# Patient Record
Sex: Male | Born: 1950 | Race: White | Hispanic: No | Marital: Married | Smoking: Former smoker
Health system: Southern US, Community
[De-identification: ages and names within clinical notes are randomized; demographics above are authoritative.]

## PROBLEM LIST (undated history)

## (undated) DIAGNOSIS — R600 Localized edema: Secondary | ICD-10-CM

## (undated) DIAGNOSIS — M1990 Unspecified osteoarthritis, unspecified site: Secondary | ICD-10-CM

## (undated) DIAGNOSIS — R609 Edema, unspecified: Secondary | ICD-10-CM

## (undated) DIAGNOSIS — K219 Gastro-esophageal reflux disease without esophagitis: Secondary | ICD-10-CM

## (undated) DIAGNOSIS — N400 Enlarged prostate without lower urinary tract symptoms: Secondary | ICD-10-CM

## (undated) DIAGNOSIS — G4733 Obstructive sleep apnea (adult) (pediatric): Secondary | ICD-10-CM

## (undated) DIAGNOSIS — M109 Gout, unspecified: Secondary | ICD-10-CM

## (undated) DIAGNOSIS — R351 Nocturia: Secondary | ICD-10-CM

## (undated) DIAGNOSIS — I10 Essential (primary) hypertension: Secondary | ICD-10-CM

## (undated) DIAGNOSIS — I2510 Atherosclerotic heart disease of native coronary artery without angina pectoris: Secondary | ICD-10-CM

## (undated) DIAGNOSIS — F39 Unspecified mood [affective] disorder: Secondary | ICD-10-CM

## (undated) DIAGNOSIS — G7000 Myasthenia gravis without (acute) exacerbation: Secondary | ICD-10-CM

## (undated) DIAGNOSIS — I4891 Unspecified atrial fibrillation: Secondary | ICD-10-CM

## (undated) DIAGNOSIS — M2550 Pain in unspecified joint: Secondary | ICD-10-CM

## (undated) DIAGNOSIS — M2540 Effusion, unspecified joint: Secondary | ICD-10-CM

## (undated) DIAGNOSIS — I219 Acute myocardial infarction, unspecified: Secondary | ICD-10-CM

## (undated) DIAGNOSIS — J189 Pneumonia, unspecified organism: Secondary | ICD-10-CM

## (undated) DIAGNOSIS — Z87442 Personal history of urinary calculi: Secondary | ICD-10-CM

## (undated) DIAGNOSIS — R350 Frequency of micturition: Secondary | ICD-10-CM

## (undated) DIAGNOSIS — E785 Hyperlipidemia, unspecified: Secondary | ICD-10-CM

## (undated) HISTORY — PX: ANKLE ARTHROSCOPY: SUR85

## (undated) HISTORY — DX: Hyperlipidemia, unspecified: E78.5

## (undated) HISTORY — DX: Acute myocardial infarction, unspecified: I21.9

## (undated) HISTORY — DX: Atherosclerotic heart disease of native coronary artery without angina pectoris: I25.10

## (undated) HISTORY — DX: Essential (primary) hypertension: I10

## (undated) HISTORY — DX: Obstructive sleep apnea (adult) (pediatric): G47.33

## (undated) HISTORY — PX: CORONARY ANGIOPLASTY: SHX604

## (undated) HISTORY — PX: OTHER SURGICAL HISTORY: SHX169

## (undated) HISTORY — PX: BACK SURGERY: SHX140

## (undated) HISTORY — PX: TOTAL KNEE ARTHROPLASTY: SHX125

## (undated) HISTORY — DX: Unspecified osteoarthritis, unspecified site: M19.90

---

## 2003-11-12 HISTORY — PX: CARDIAC CATHETERIZATION: SHX172

## 2004-06-03 ENCOUNTER — Encounter: Payer: Self-pay | Admitting: Cardiology

## 2004-06-11 ENCOUNTER — Emergency Department (HOSPITAL_COMMUNITY): Admission: EM | Admit: 2004-06-11 | Discharge: 2004-06-11 | Payer: Self-pay | Admitting: Emergency Medicine

## 2004-06-21 ENCOUNTER — Encounter (HOSPITAL_COMMUNITY): Admission: RE | Admit: 2004-06-21 | Discharge: 2004-07-21 | Payer: Self-pay | Admitting: *Deleted

## 2004-07-23 ENCOUNTER — Encounter (HOSPITAL_COMMUNITY): Admission: RE | Admit: 2004-07-23 | Discharge: 2004-08-10 | Payer: Self-pay | Admitting: *Deleted

## 2004-08-13 ENCOUNTER — Encounter (HOSPITAL_COMMUNITY): Admission: RE | Admit: 2004-08-13 | Discharge: 2004-09-12 | Payer: Self-pay | Admitting: *Deleted

## 2004-09-15 ENCOUNTER — Encounter (HOSPITAL_COMMUNITY): Admission: RE | Admit: 2004-09-15 | Discharge: 2004-10-15 | Payer: Self-pay | Admitting: *Deleted

## 2005-02-11 ENCOUNTER — Ambulatory Visit: Payer: Self-pay | Admitting: *Deleted

## 2005-02-28 ENCOUNTER — Ambulatory Visit: Payer: Self-pay | Admitting: *Deleted

## 2005-11-29 ENCOUNTER — Ambulatory Visit: Payer: Self-pay | Admitting: *Deleted

## 2005-12-13 ENCOUNTER — Ambulatory Visit: Payer: Self-pay | Admitting: Cardiology

## 2005-12-30 ENCOUNTER — Ambulatory Visit: Payer: Self-pay | Admitting: *Deleted

## 2006-06-09 ENCOUNTER — Ambulatory Visit: Payer: Self-pay | Admitting: *Deleted

## 2006-12-31 ENCOUNTER — Ambulatory Visit: Payer: Self-pay | Admitting: Cardiovascular Disease

## 2007-12-16 ENCOUNTER — Ambulatory Visit: Payer: Self-pay | Admitting: Cardiovascular Disease

## 2007-12-21 ENCOUNTER — Ambulatory Visit: Payer: Self-pay | Admitting: Internal Medicine

## 2007-12-21 ENCOUNTER — Encounter (HOSPITAL_COMMUNITY): Admission: RE | Admit: 2007-12-21 | Discharge: 2008-01-20 | Payer: Self-pay | Admitting: Cardiovascular Disease

## 2007-12-21 IMAGING — NM NM MYOCAR MULTI W/ SPECT
2 series · 12 of 12 positions shown · non-contrast
Comparison: none

CLINICAL DATA: Patient is a 56-year-old gentleman who has a history of CAD.  Status post PTCA stent to the ramus intermedius back in [PG].  Taxus stent.  Being evaluated for knee surgery.  Test to evaluate.  Rule out ischemia. 
 ADENOSINE STRESS MYOVIEW STUDY:
 STRESS DATA:  Patient underwent Adenosine stress testing.  Baseline EKG showed sinus bradycardia at a rate of 52 beats per minute.  First degree AV block.  T-wave inversion in the inferior and lateral leads.  Baseline blood pressure was 140/82.  
 With the infusion of Adenosine, there was no significant ST changes to suggest ischemia.  
 NUCLEAR DATA:  The patient was studied in a 1 day rest/stress protocol.  He was injected with 10.0 mCi [PG] Myoview at rest, 30.0 mCi [PG] Myoview at stress.  Images were reconstructed in the short, vertical, and horizontal axes. 
 On the initial stress images, there was thinning with decreased tracer counts noted in the inferior wall (base mid inferolateral wall, base mid distal).  Otherwise normal perfusion.  
 On gating, LVF was calculated at 55% with normal wall motion.  Review of the raw data, there is extensive soft tissue surrounding the heart both anteriorly and inferiorly (subcutaneous fat, diaphragm).  On review of these images, the inferior/inferolateral changes most likely reflect soft tissue attenuation.  Probable normal perfusion.

[Series 1: cs cardiac tc hi dose · 6.52mm/px · 6 of 512 frames shown]
[frame 43/512]
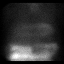
[frame 128/512]
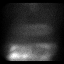
[frame 214/512]
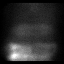
[frame 299/512]
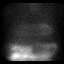
[frame 384/512]
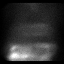
[frame 470/512]
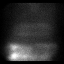

[Series 1: cr cardiac tc low dose · 6.52mm/px · 6 of 64 frames shown]
[frame 6/64]
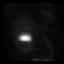
[frame 16/64]
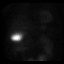
[frame 27/64]
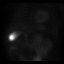
[frame 38/64]
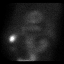
[frame 48/64]
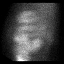
[frame 59/64]
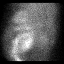

[12 of 12 positions shown; findings below may reference images not displayed]

IMPRESSION: Adenosine Myoview electrically negative for ischemia.  Myoview scan with probable normal perfusion and soft tissue attenuation as noted above.  LVF on gating calculated at 55%.  No evidence for significant ischemia.

## 2008-01-09 ENCOUNTER — Emergency Department (HOSPITAL_COMMUNITY): Admission: EM | Admit: 2008-01-09 | Discharge: 2008-01-09 | Payer: Self-pay | Admitting: Emergency Medicine

## 2008-01-09 IMAGING — CR DG ANKLE COMPLETE 3+V*L*
3 series · 3 of 3 positions shown · non-contrast
Comparison: None.

CLINICAL DATA: Ankle injury at work.   Pain.
 LEFT ANKLE - 3 VIEW 0 [DATE]:

[view not recorded (1 of 3)]
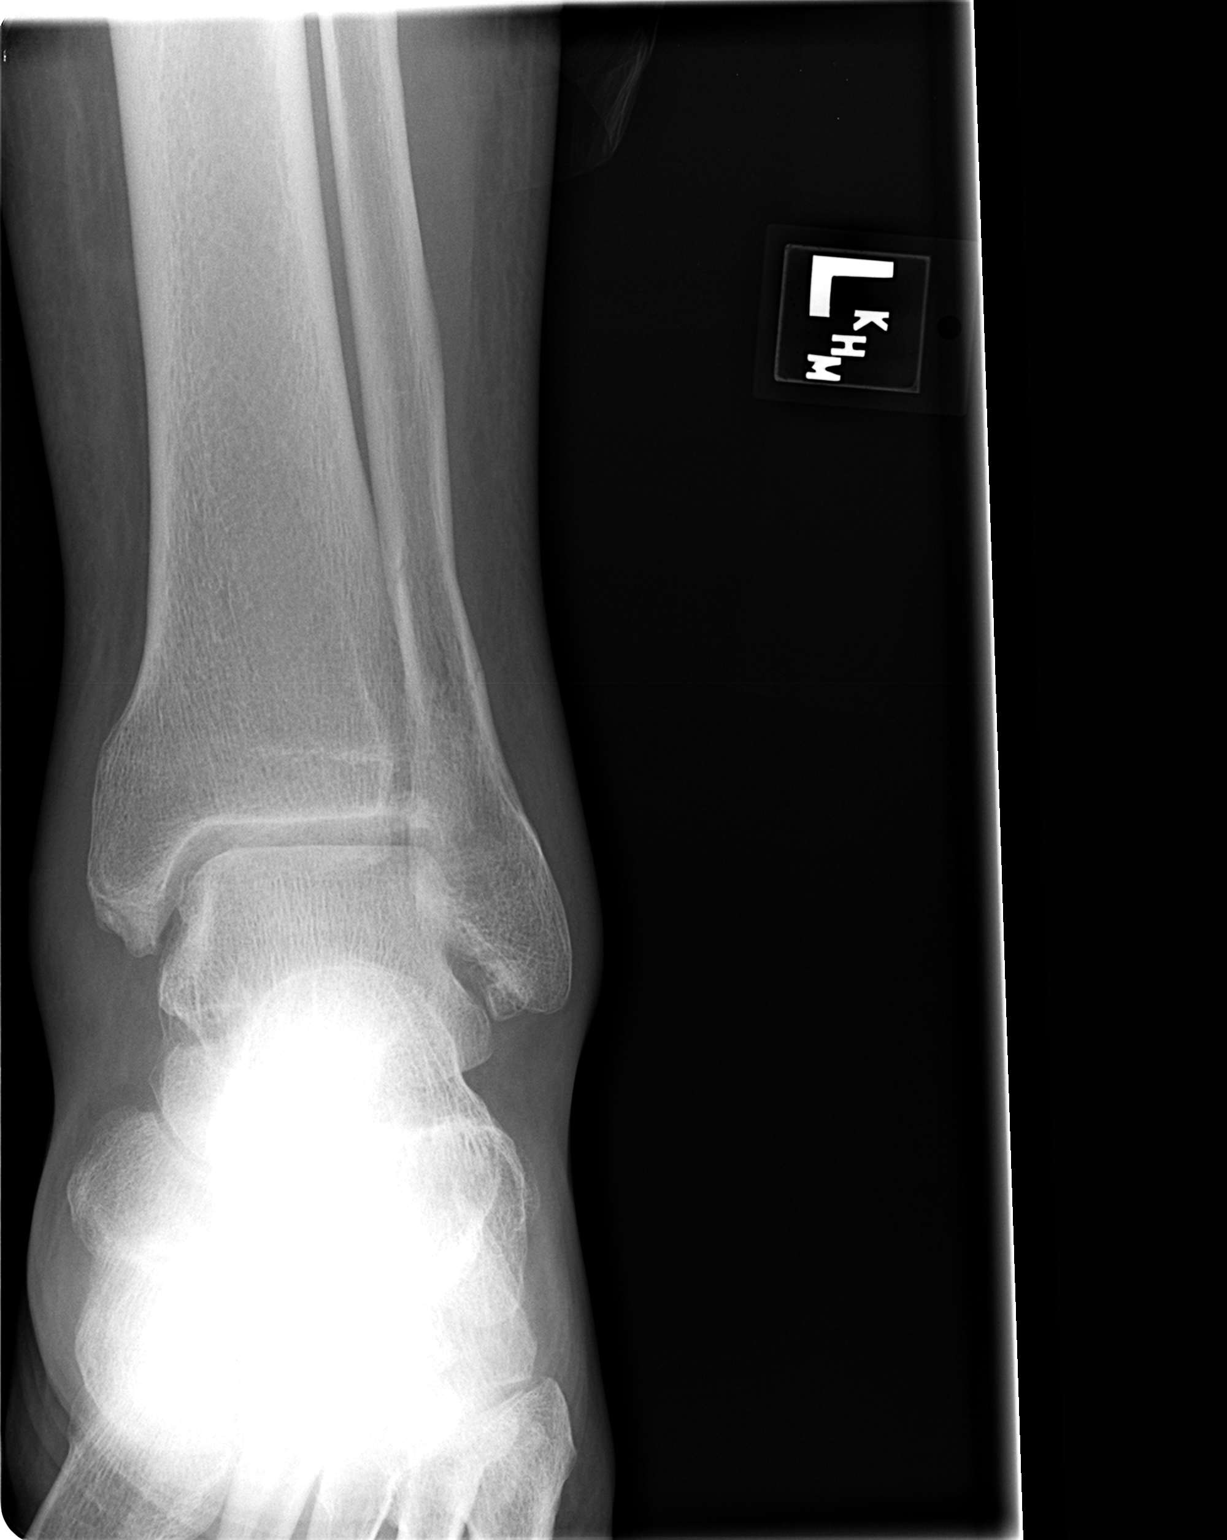

[view not recorded (2 of 3)]
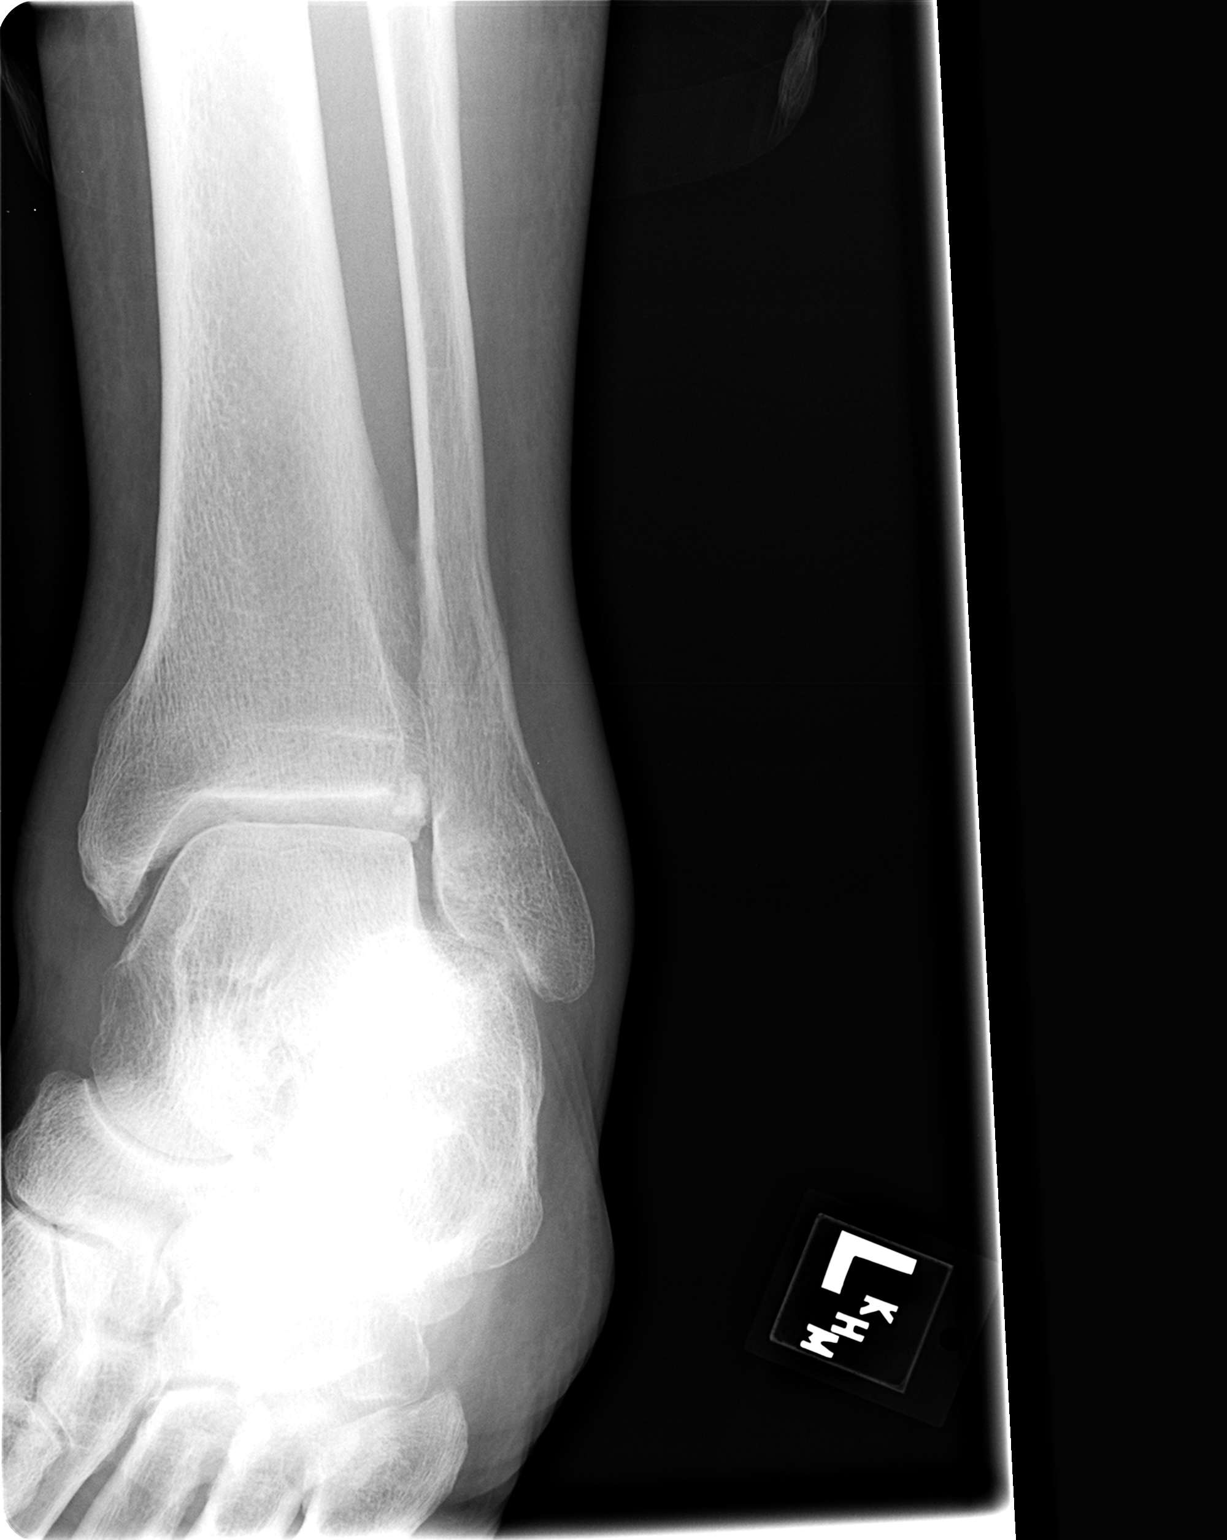

[view not recorded (3 of 3)]
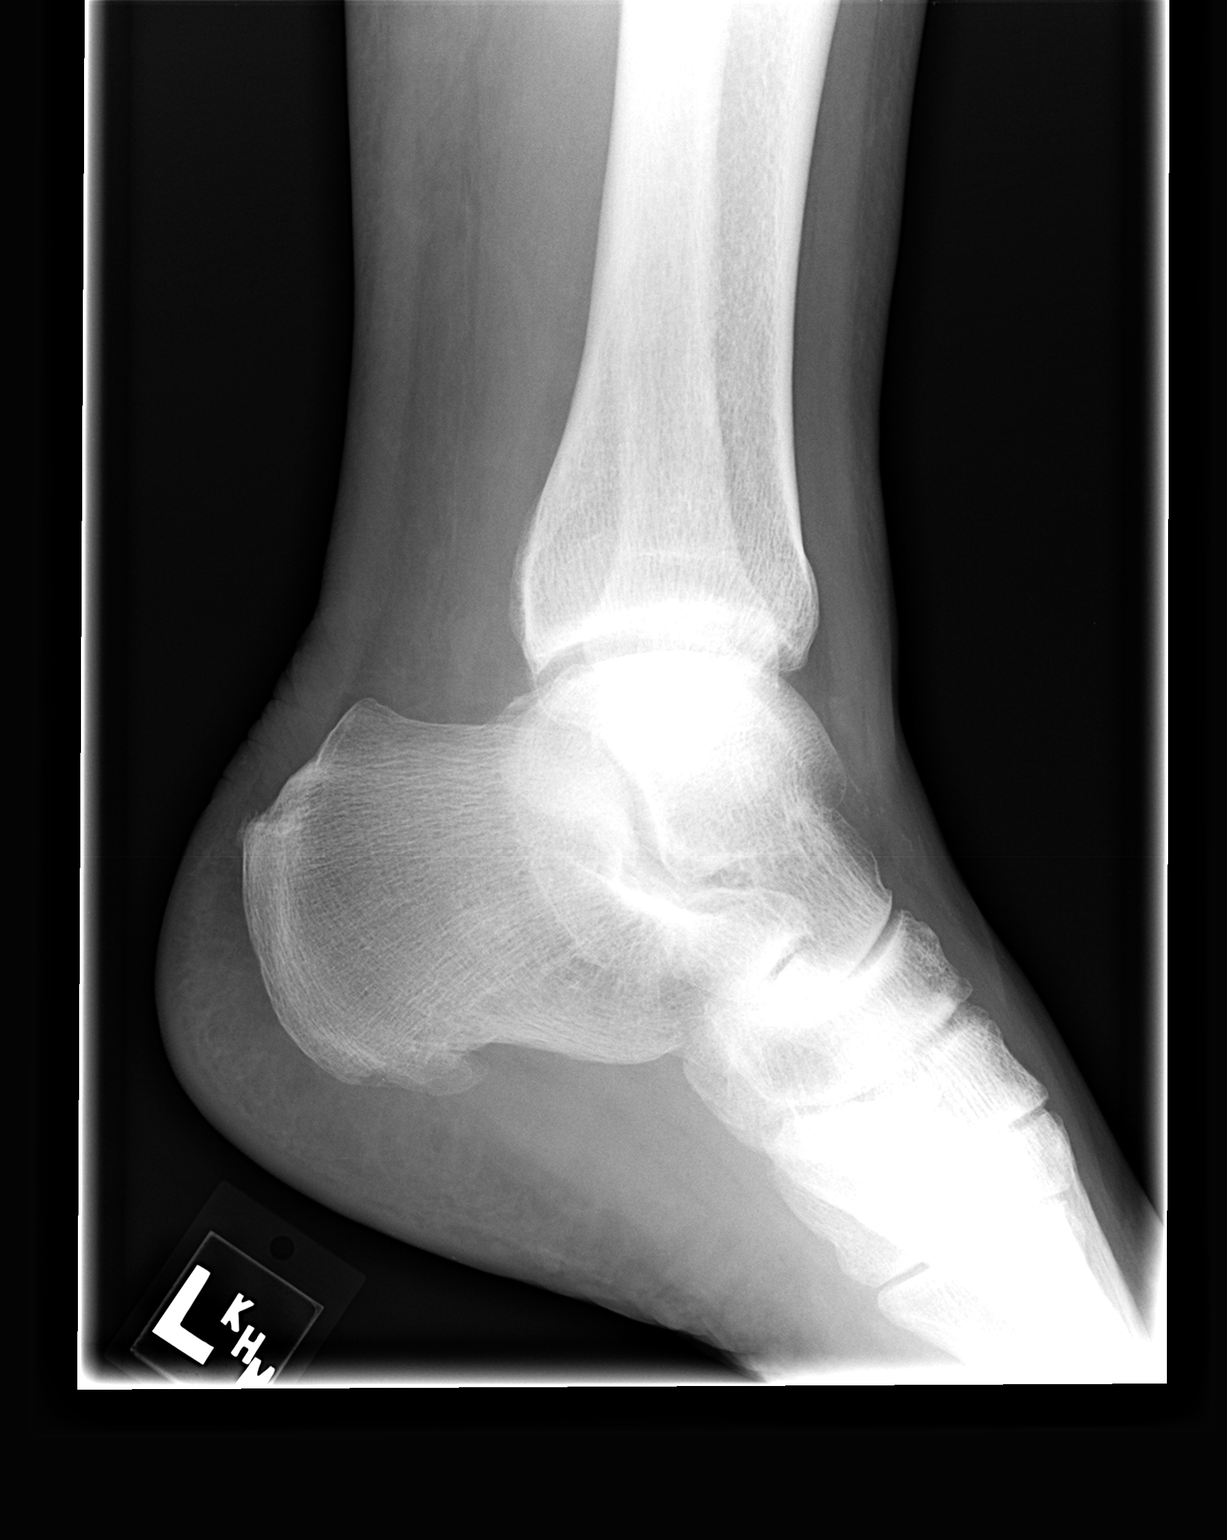

[3 of 3 positions shown; findings below may reference images not displayed]

FINDINGS: There is a nondisplaced fracture of the distal fibula, proximal to the ankle mortise.  No evidence for distal tibial fracture.  Tibiotalar articulation appears within normal limits.  No evidence for subluxation.
IMPRESSION: Nondisplaced distal fibula fracture.

## 2009-01-18 ENCOUNTER — Ambulatory Visit: Payer: Self-pay | Admitting: Cardiology

## 2009-04-24 ENCOUNTER — Telehealth (INDEPENDENT_AMBULATORY_CARE_PROVIDER_SITE_OTHER): Payer: Self-pay | Admitting: *Deleted

## 2009-04-26 ENCOUNTER — Telehealth: Payer: Self-pay | Admitting: Cardiology

## 2009-04-26 IMAGING — CR DG CHEST 2V
2 series · 2 of 2 positions shown · non-contrast
Comparison: None

CLINICAL DATA: Preadmission for left ankle surgery

CHEST - 2 VIEW

[view not recorded (1 of 2)]
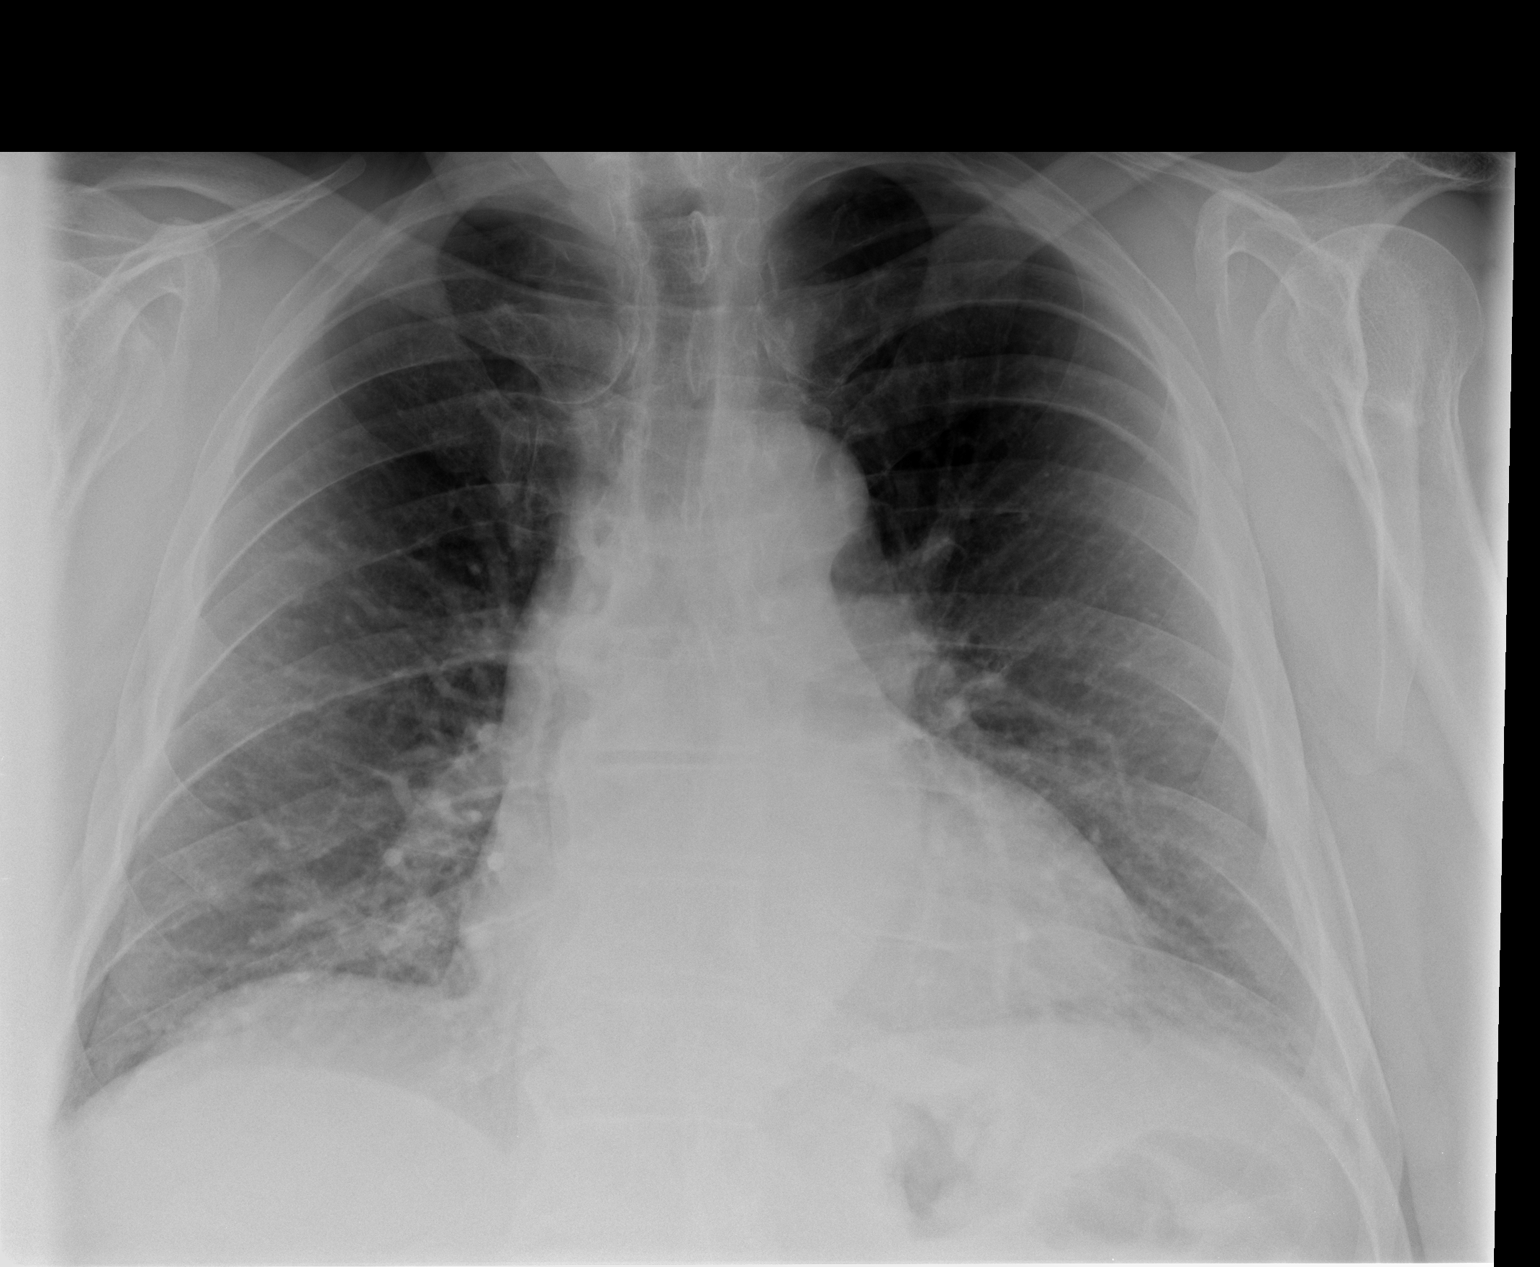

[view not recorded (2 of 2)]
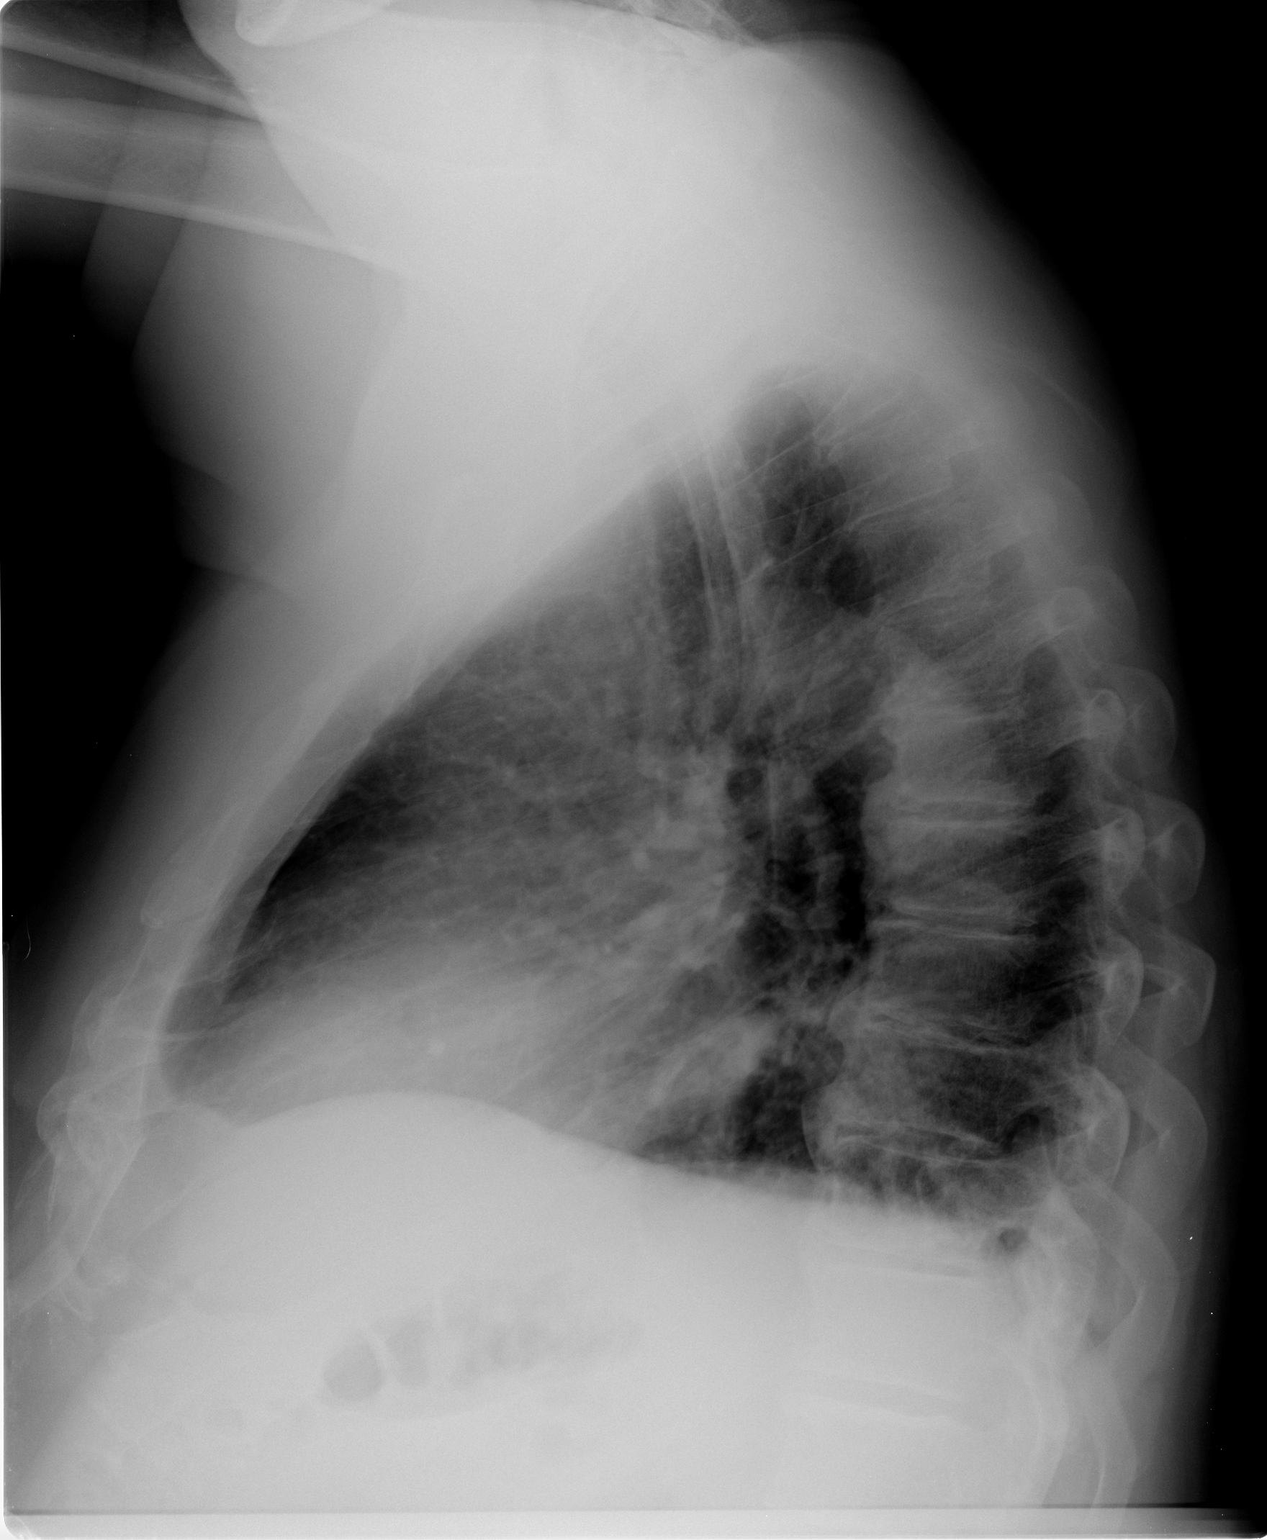

[2 of 2 positions shown; findings below may reference images not displayed]

FINDINGS: Cardiomediastinal silhouette is unremarkable.  Study is
limited by poor inspiration.  Bilateral basilar atelectasis.  No
acute infiltrate or edema.  Degenerative changes thoracic spine.
IMPRESSION: Limited study by poor inspiration.  Bilateral basilar atelectasis.
No active disease.  Degenerative changes thoracic spine.

## 2009-04-28 ENCOUNTER — Ambulatory Visit (HOSPITAL_COMMUNITY): Admission: RE | Admit: 2009-04-28 | Discharge: 2009-04-28 | Payer: Self-pay | Admitting: Orthopaedic Surgery

## 2009-05-01 ENCOUNTER — Telehealth: Payer: Self-pay | Admitting: Cardiology

## 2009-05-08 DIAGNOSIS — E785 Hyperlipidemia, unspecified: Secondary | ICD-10-CM | POA: Insufficient documentation

## 2010-01-03 ENCOUNTER — Ambulatory Visit: Payer: Self-pay | Admitting: Cardiology

## 2010-01-03 DIAGNOSIS — I10 Essential (primary) hypertension: Secondary | ICD-10-CM | POA: Insufficient documentation

## 2010-01-03 DIAGNOSIS — I2510 Atherosclerotic heart disease of native coronary artery without angina pectoris: Secondary | ICD-10-CM | POA: Insufficient documentation

## 2010-01-03 DIAGNOSIS — R350 Frequency of micturition: Secondary | ICD-10-CM

## 2010-01-04 ENCOUNTER — Encounter: Payer: Self-pay | Admitting: Cardiology

## 2010-01-08 ENCOUNTER — Encounter: Payer: Self-pay | Admitting: Cardiology

## 2010-01-08 LAB — CONVERTED CEMR LAB
AST: 19 units/L (ref 0–37)
Albumin: 4 g/dL (ref 3.5–5.2)
Alkaline Phosphatase: 87 units/L (ref 39–117)
Bilirubin, Direct: 0.1 mg/dL (ref 0.0–0.3)
Indirect Bilirubin: 0.4 mg/dL (ref 0.0–0.9)
LDL Cholesterol: 56 mg/dL (ref 0–99)
Total Bilirubin: 0.5 mg/dL (ref 0.3–1.2)

## 2010-02-15 ENCOUNTER — Ambulatory Visit (HOSPITAL_COMMUNITY): Admission: RE | Admit: 2010-02-15 | Discharge: 2010-02-15 | Payer: Self-pay | Admitting: Urology

## 2010-02-15 IMAGING — CT CT ABD-PEL WO/W CM
3 of 11 series · 12 of 46 positions shown, 18 images · IV contrast (agent unspecified)
Comparison: None.

CLINICAL DATA: Microscopic hematuria

CT ABDOMEN AND PELVIS WITHOUT AND WITH CONTRAST
TECHNIQUE: Multidetector CT imaging of the abdomen and pelvis was
performed without contrast material in one or both body regions,
followed by contrast material(s) and further sections in one or
both body regions. Breast shield utilized.
Contrast: 125 ml [YV] IV;  oral contrast not administered

[Series 2: pre contrast a/p (id) · axial · non-contrast · 0.96mm/px · z∈[-476,-66]mm · 8 of 106 slices shown, 13 images]
[im 12/106  soft-tissue]
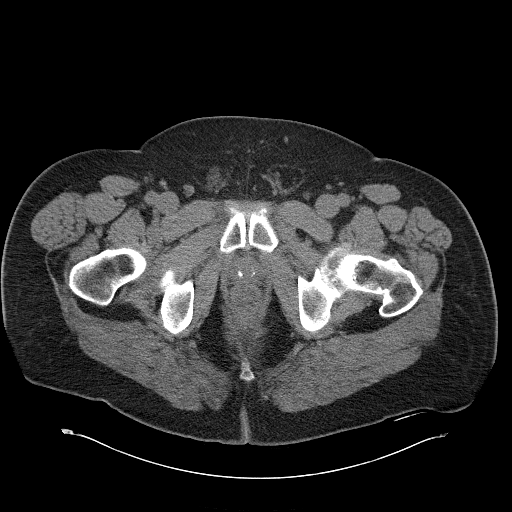
[im 12/106  bone]
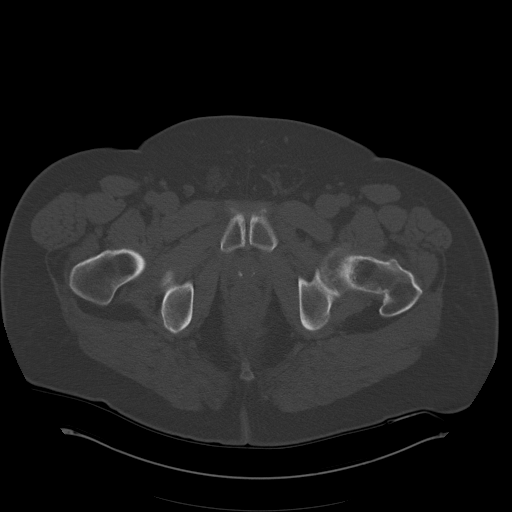
[im 24/106  soft-tissue]
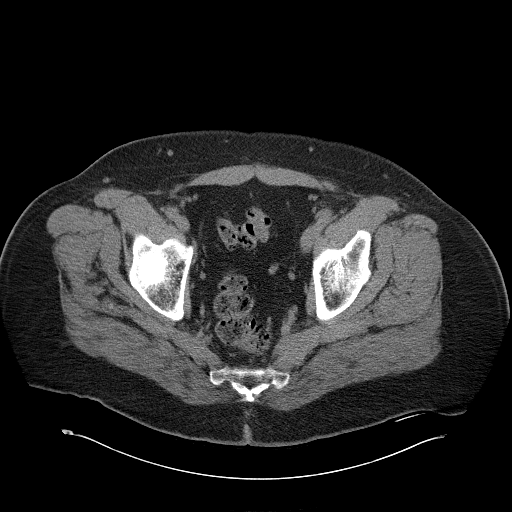
[im 36/106  soft-tissue]
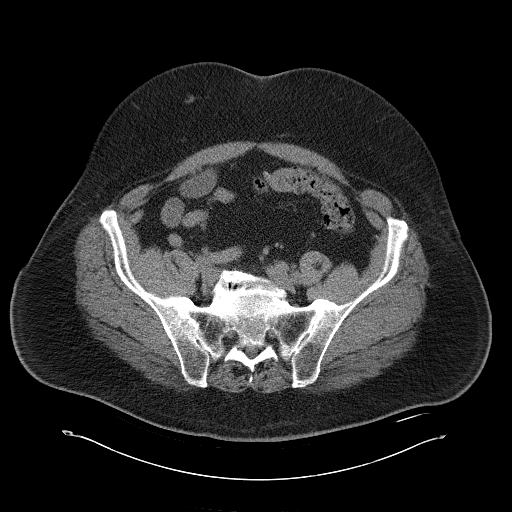
[im 47/106  soft-tissue]
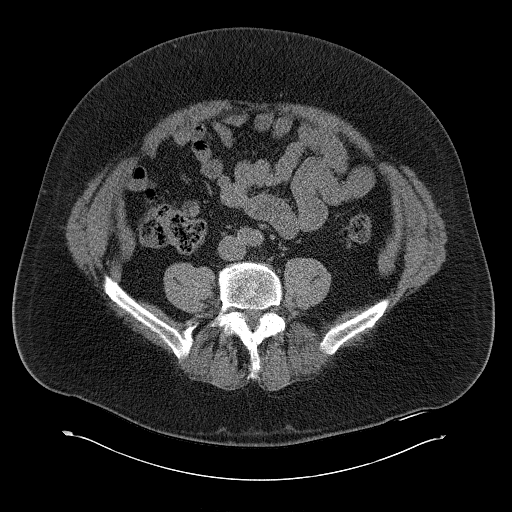
[im 59/106  soft-tissue]
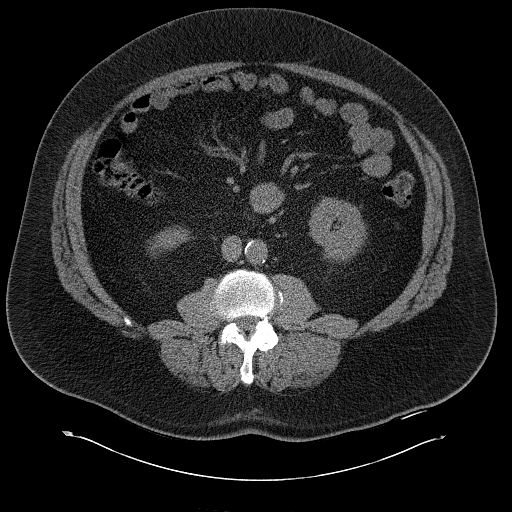
[im 59/106  lung]
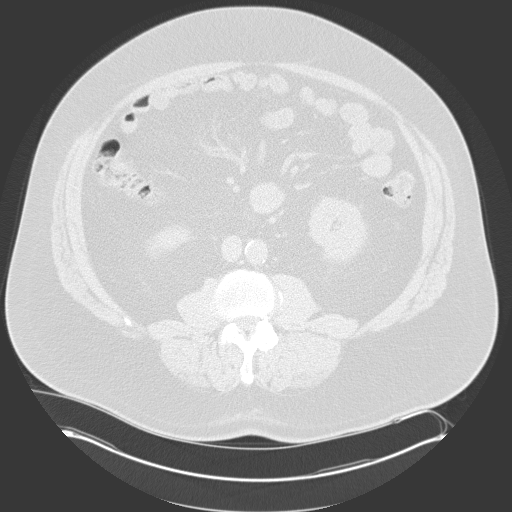
[im 71/106  soft-tissue]
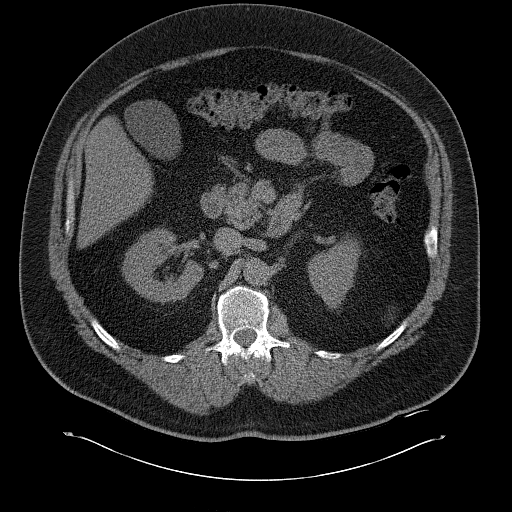
[im 71/106  lung]
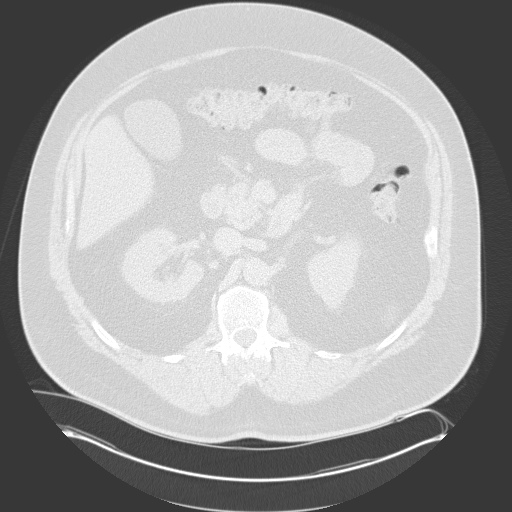
[im 82/106  soft-tissue]
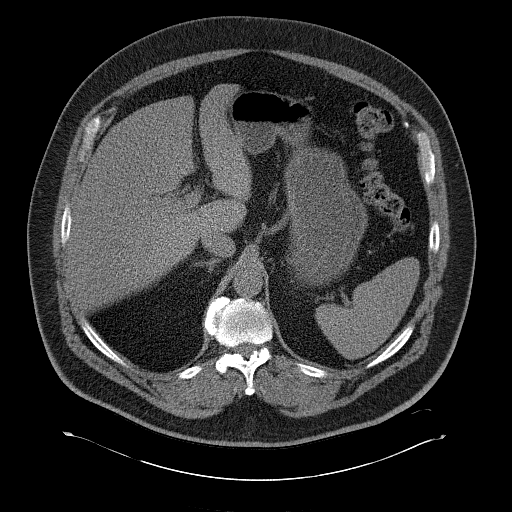
[im 82/106  lung]
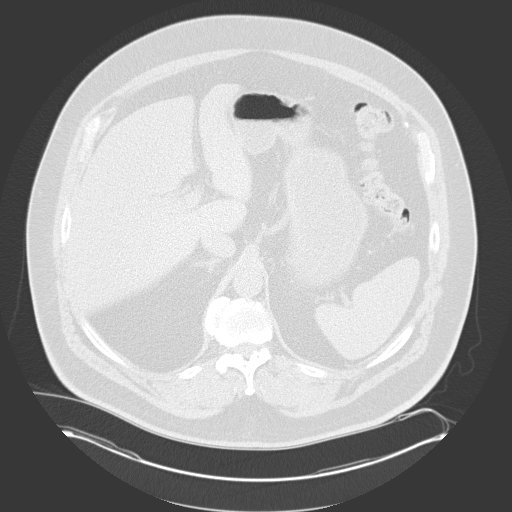
[im 94/106  soft-tissue]
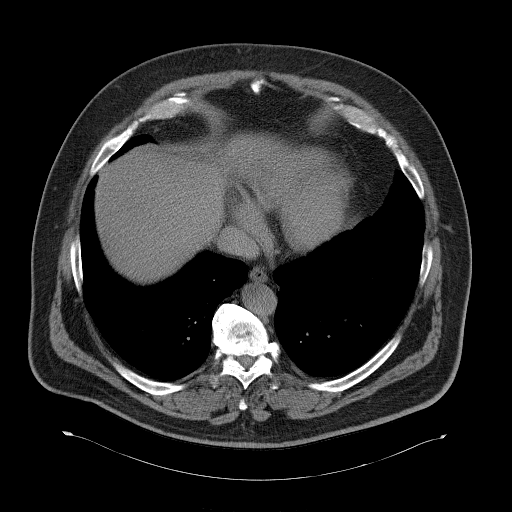
[im 94/106  lung]
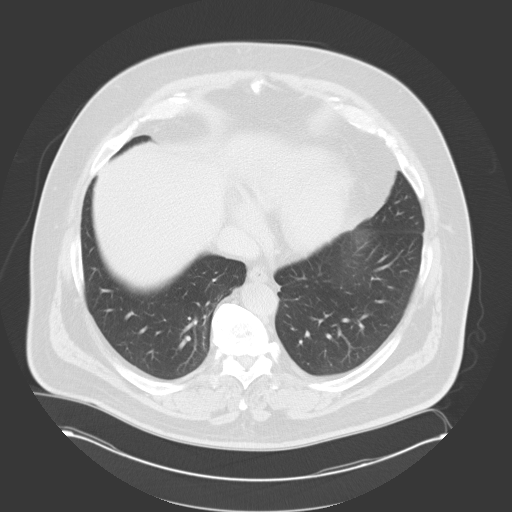

[Series 3: mpr coro pre contrast (id) · coronal · non-contrast · 0.86mm/px · 2 of 116 slices shown, 3 images]
[im 39/116  soft-tissue]
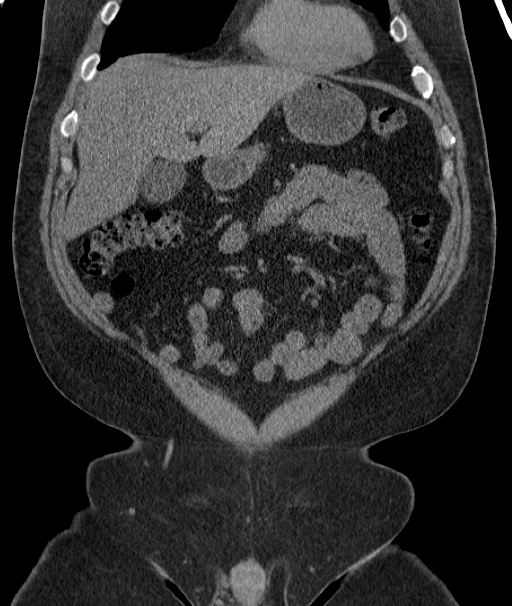
[im 39/116  bone]
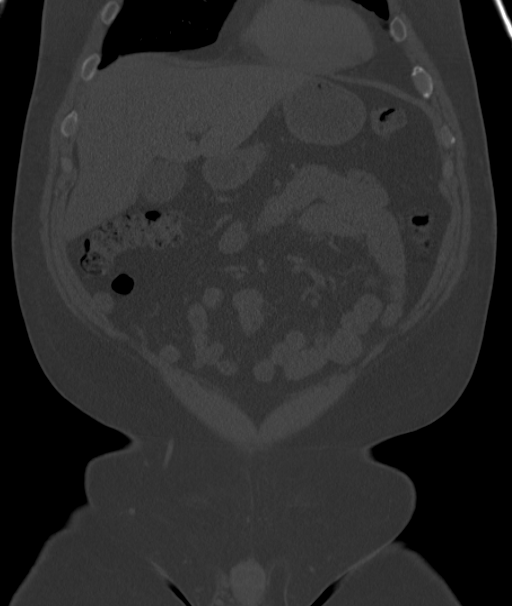
[im 77/116  soft-tissue]
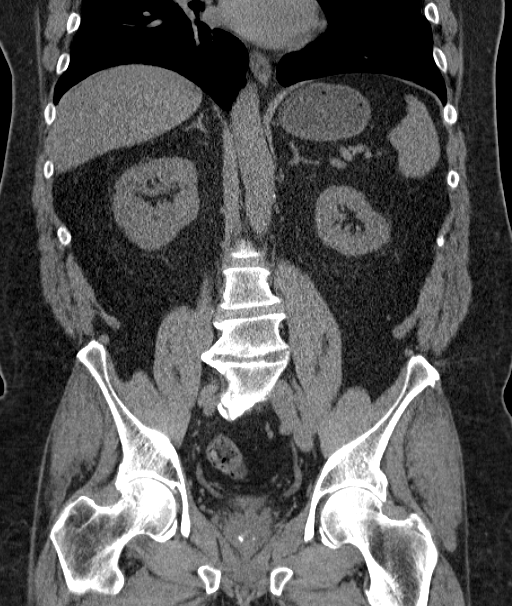

[Series 6: post contrast abd/pelvis (id) · axial · 0.96mm/px · z∈[-466,-402]mm · 2 of 106 slices shown]
[im 14/106  soft-tissue]
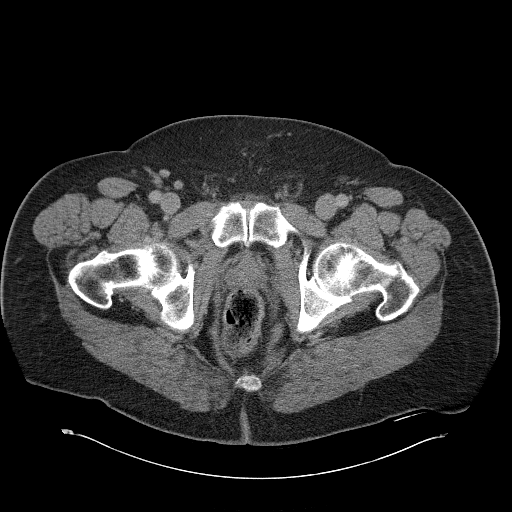
[im 27/106  soft-tissue]
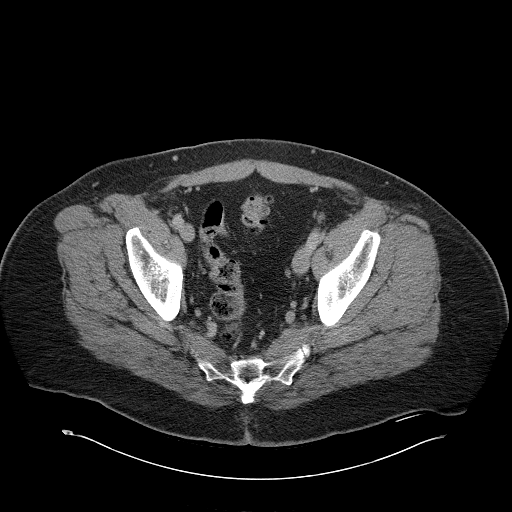

[12 of 46 positions shown; findings below may reference images not displayed]

FINDINGS: Minimal scarring versus atelectasis right lung base.
Few coronary arterial calcifications are seen.
Minimal atherosclerotic calcification aorta.
8 x 5 mm diameter calculus lower pole right kidney.
3 mm diameter calculus upper pole left kidney.
No hydronephrosis or ureteral dilatation.
Symmetric nephrograms without definite renal mass.
Single nonobstructed unremarkable ureters.
Bladder contains minimal urine, no gross mass seen.

Liver, spleen, pancreas, gallbladder, and adrenal glands normal
appearance.
Sigmoid diverticulosis without evidence of diverticulitis.
Stomach and bowel loops otherwise grossly unremarkable for
technique.
No mass, adenopathy, free fluid or inflammatory process.
Normal appendix.
Tiny umbilical hernia containing fat.
Dilated left inguinal canal containing fat, question inguinal
hernia.
Degenerative disc and facet disease changes thoracolumbar spine.
IMPRESSION: Nonobstructing bilateral renal calculi.
No evidence of renal mass or urinary tract dilatation.
Tiny umbilical and question left inguinal hernias.
Mild sigmoid diverticulosis.

## 2010-03-21 ENCOUNTER — Ambulatory Visit (HOSPITAL_COMMUNITY): Admission: RE | Admit: 2010-03-21 | Discharge: 2010-03-21 | Payer: Self-pay | Admitting: Urology

## 2010-03-21 IMAGING — CR DG ABDOMEN 1V
2 series · 2 of 2 positions shown · non-contrast
Comparison: CT [DATE]

CLINICAL DATA: Bilateral renal calculi

ABDOMEN - 1 VIEW

[view not recorded (1 of 2)]
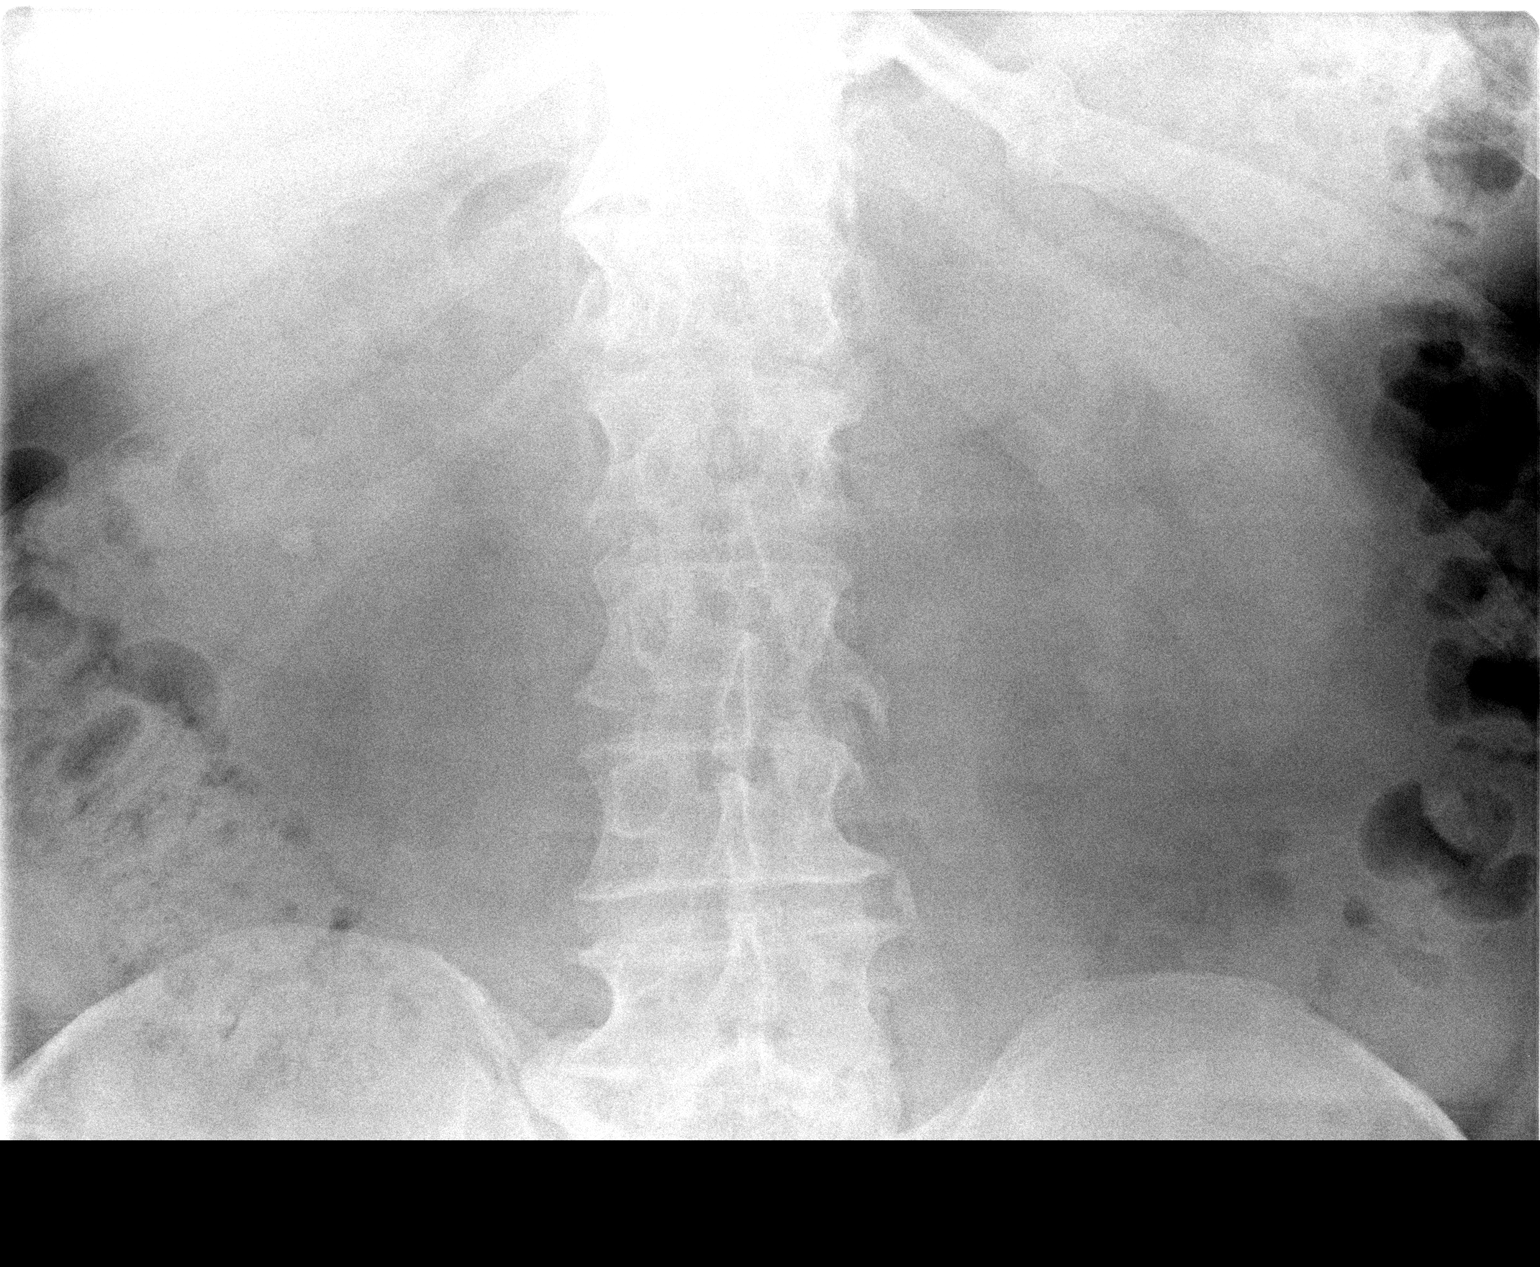

[view not recorded (2 of 2)]
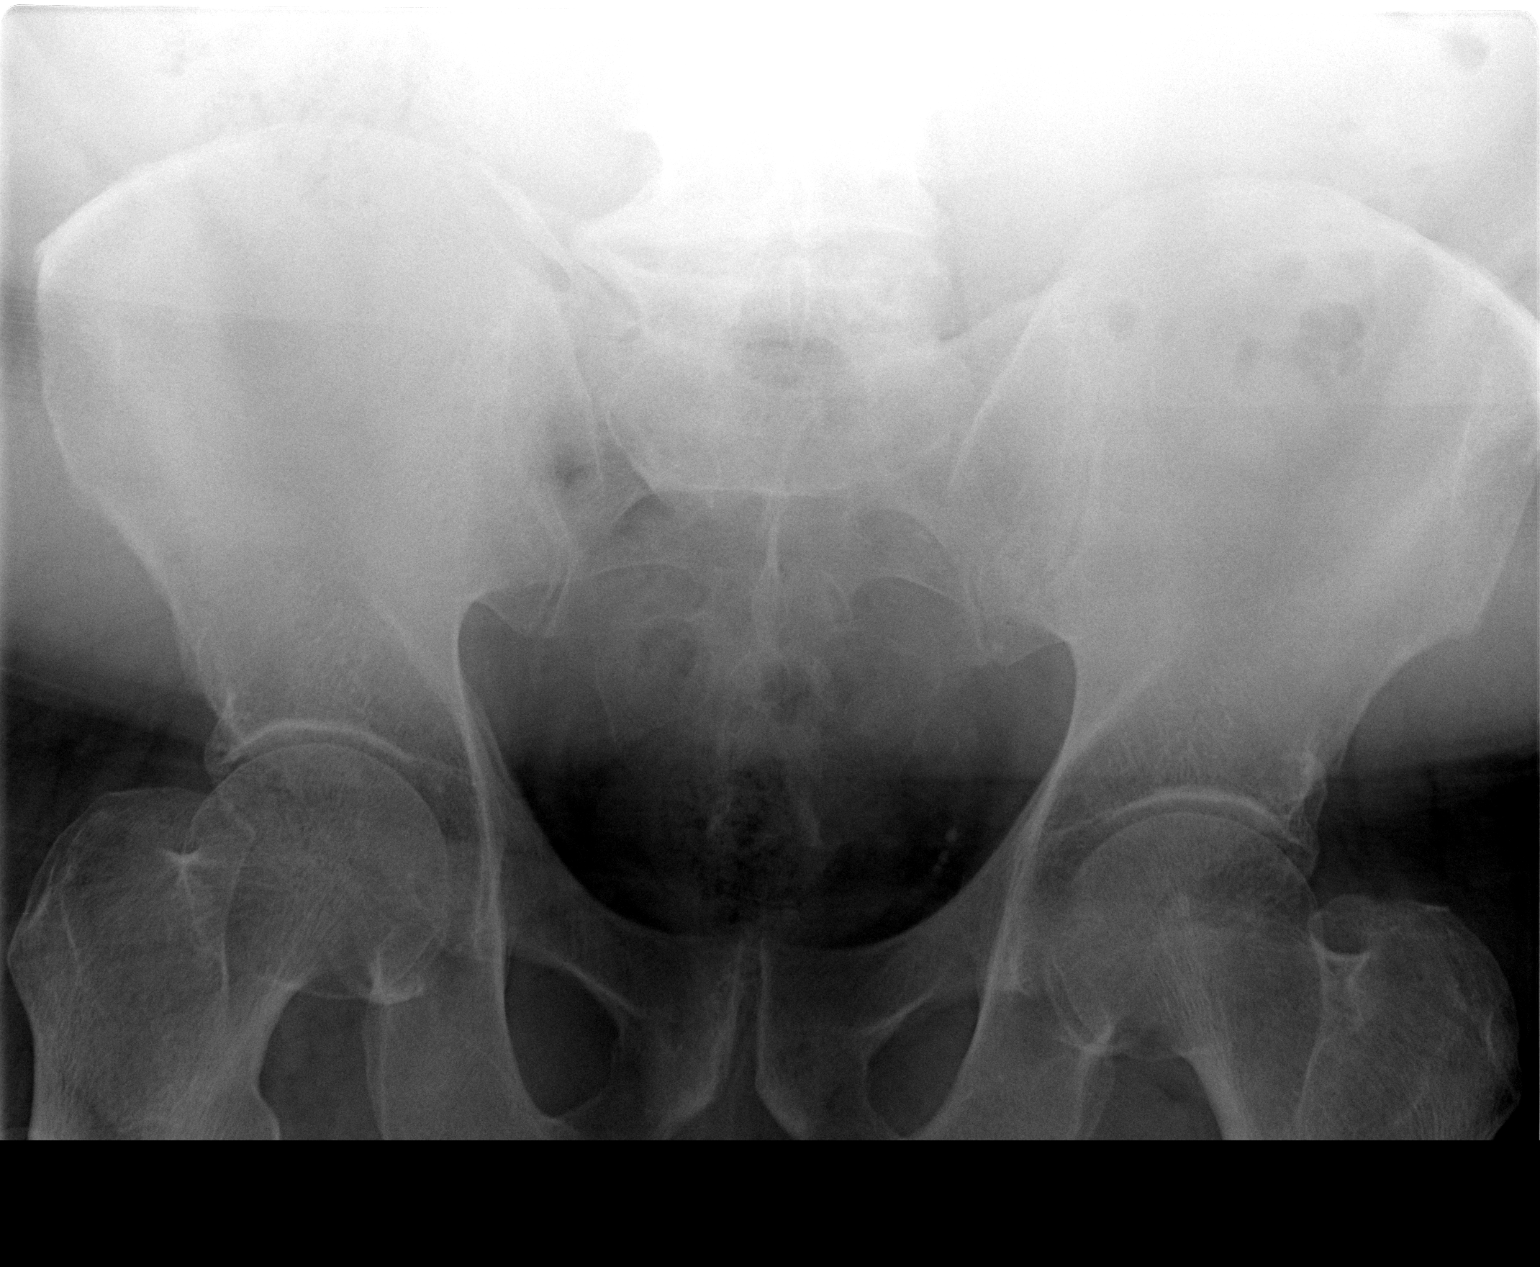

[2 of 2 positions shown; findings below may reference images not displayed]

FINDINGS: Exam is somewhat limited due to body patient habitus.  An
8 mm calculus is present within the lower pole of the right kidney.
The left nephrolithiasis are not demonstrated on exam.  No evidence
of ureterolithiasis. Vascular calcifications are noted in the lower
left  pelvis.
IMPRESSION: 1.  Right nephrolithiasis.
2.  No evidence of ureterolithiasis.

## 2010-08-14 ENCOUNTER — Ambulatory Visit: Payer: Self-pay | Admitting: Cardiology

## 2010-08-24 ENCOUNTER — Encounter (INDEPENDENT_AMBULATORY_CARE_PROVIDER_SITE_OTHER): Payer: Self-pay | Admitting: *Deleted

## 2010-08-24 ENCOUNTER — Ambulatory Visit: Payer: Self-pay | Admitting: Cardiology

## 2010-08-24 ENCOUNTER — Encounter (HOSPITAL_COMMUNITY): Admission: RE | Admit: 2010-08-24 | Discharge: 2010-09-23 | Payer: Self-pay | Admitting: Cardiology

## 2010-08-24 IMAGING — NM NM MYOCAR MULTI W/SPECT W/WALL MOTION & EF
2 series · 12 of 12 positions shown · non-contrast
Comparison: none

Ordering Physician: SMEETS

SMEETS Physician: [REDACTED]al Data: 59-year-old male with known CAD, hypertension,
hyperlipidemia, referred at part of preoperative evaluation, to
assess extent and degree of ischemia.
NUCLEAR MEDICINE STRESS MYOVIEW STUDY WITH SPECT AND LEFT
VENTRICULAR EJECTION FRACTION
Radionuclide Data: One-day rest/stress protocol performed with
[DATE] mCi of [28] Myoview.
Stress Data: Lexiscan bolus given in standard fashion.  The patient
did experience some chest discomfort.  Heart rate increased from 63
beats per minute up to 91 beats per minute and blood pressure
remained stable 160/82.  No clearly diagnostic ST-segment changes
are noted by standard criteria.  No arrhythmias were noted.
EKG: Resting electrocardiogram shows sinus rhythm at 65 beats per
minute with nonspecific ST-T wave changes.
Scintigraphic Data: Analysis of the raw perfusion data finds soft
tissue attenuation.
Tomographic views were obtained using the short axis, vertical long
axis, and horizontal long axis planes.  There is a mild
inferolateral defect noted at the base, more prominent at rest.
Also small anteroseptal defect that is more prominent at rest.
Most consistent with soft tissue attenuation rather than scar.  No
frank ischemia is noted.
Gated imaging reveals an EDV of 150, ESV of 68, T I D ratio of
1.12, and LVEF of 55% without wall motion abnormality.

[Series 1: cs cardiac tc hi dose · 6.41mm/px · 6 of 512 frames shown]
[frame 43/512]
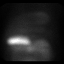
[frame 128/512]
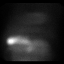
[frame 214/512]
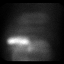
[frame 299/512]
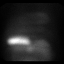
[frame 384/512]
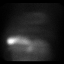
[frame 470/512]
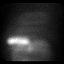

[Series 1: cr cardiac tc low dose · 6.41mm/px · 6 of 64 frames shown]
[frame 6/64]
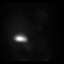
[frame 16/64]
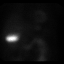
[frame 27/64]
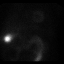
[frame 38/64]
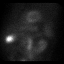
[frame 48/64]
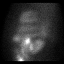
[frame 59/64]
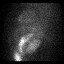

[12 of 12 positions shown; findings below may reference images not displayed]

IMPRESSION: Low risk Lexiscan Myoview as outlined.  No diagnostic ST-segment
changes were noted.  There is evidence of soft tissue attenuation
affecting the basal inferolateral wall and apical anteroseptal
wall.  Normal wall motion is noted in these areas.  No clear
evidence of ischemia.  LVEF 55%.

## 2010-09-03 ENCOUNTER — Telehealth: Payer: Self-pay | Admitting: Cardiology

## 2010-09-10 IMAGING — CR DG CHEST 2V
2 series · 2 of 2 positions shown · non-contrast
Comparison: Two-view chest x-ray [DATE].

CLINICAL DATA: Right knee osteoarthritis.  Preoperative respiratory
evaluation prior right knee arthroplasty.

CHEST - 2 VIEW [DATE]:

[view not recorded (1 of 2)]
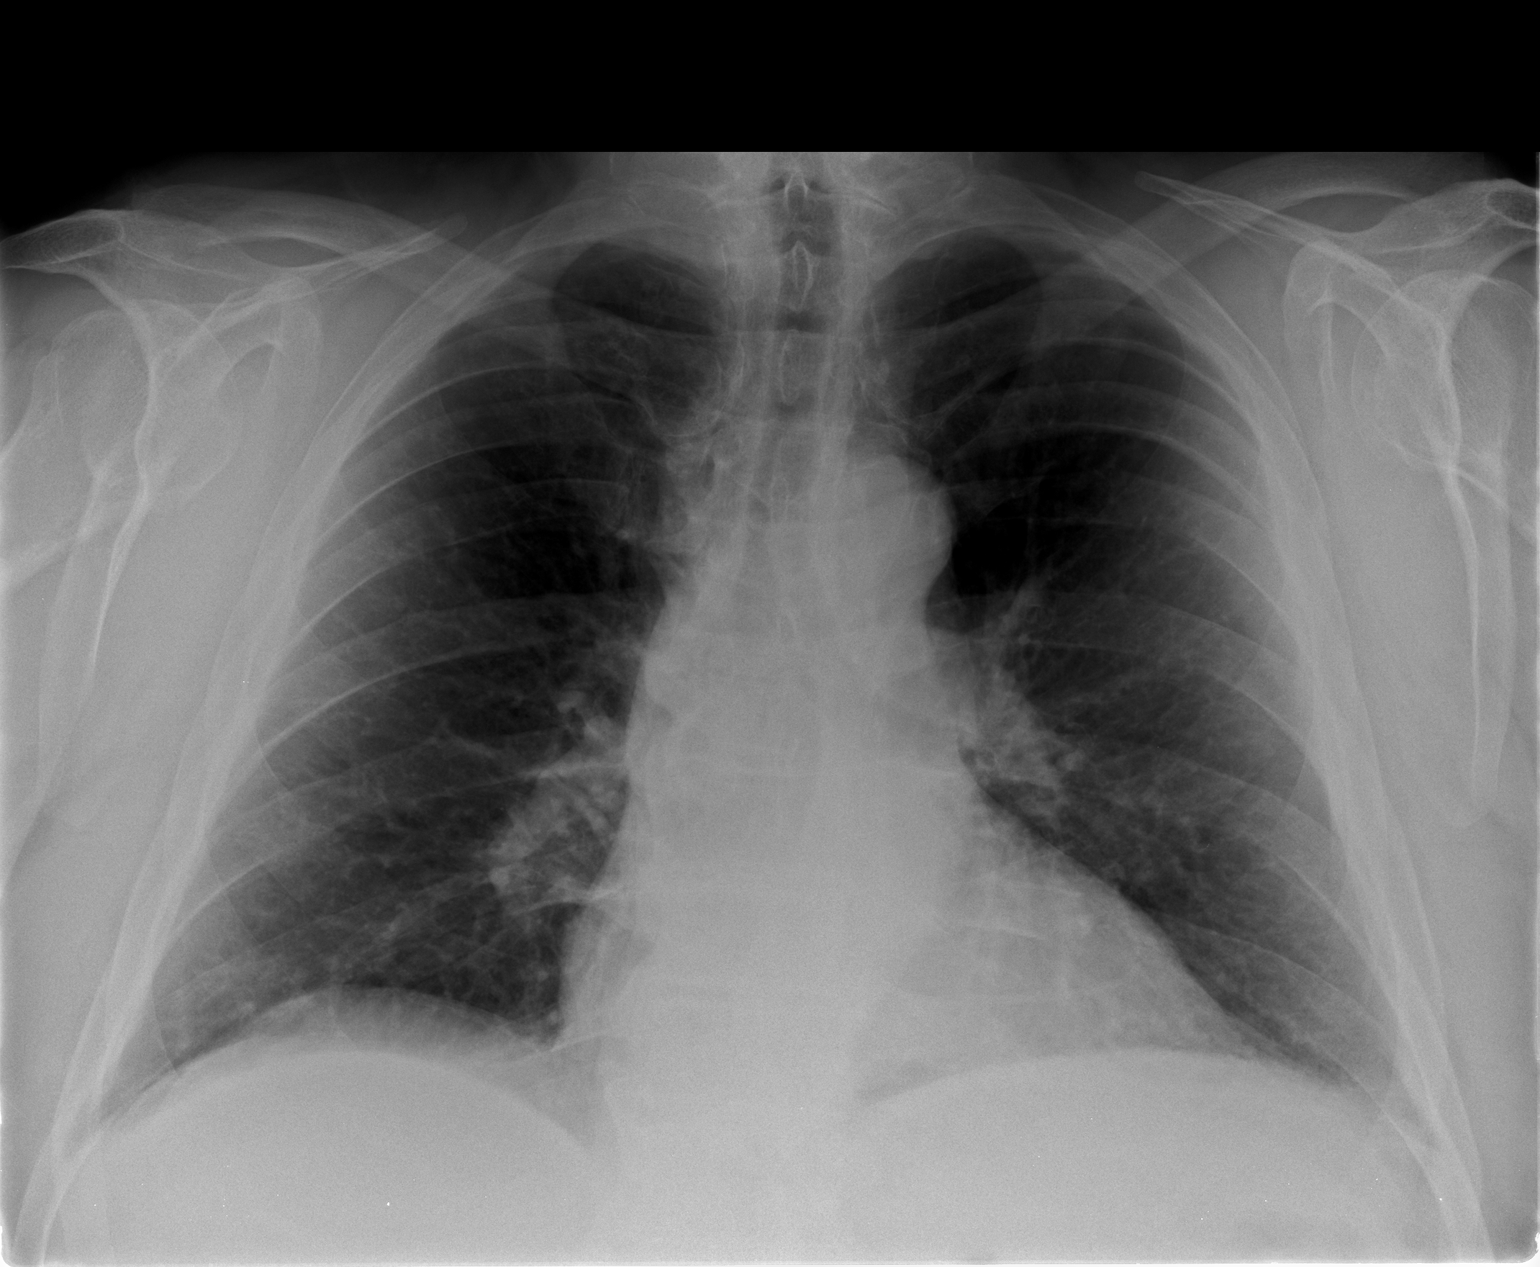

[view not recorded (2 of 2)]
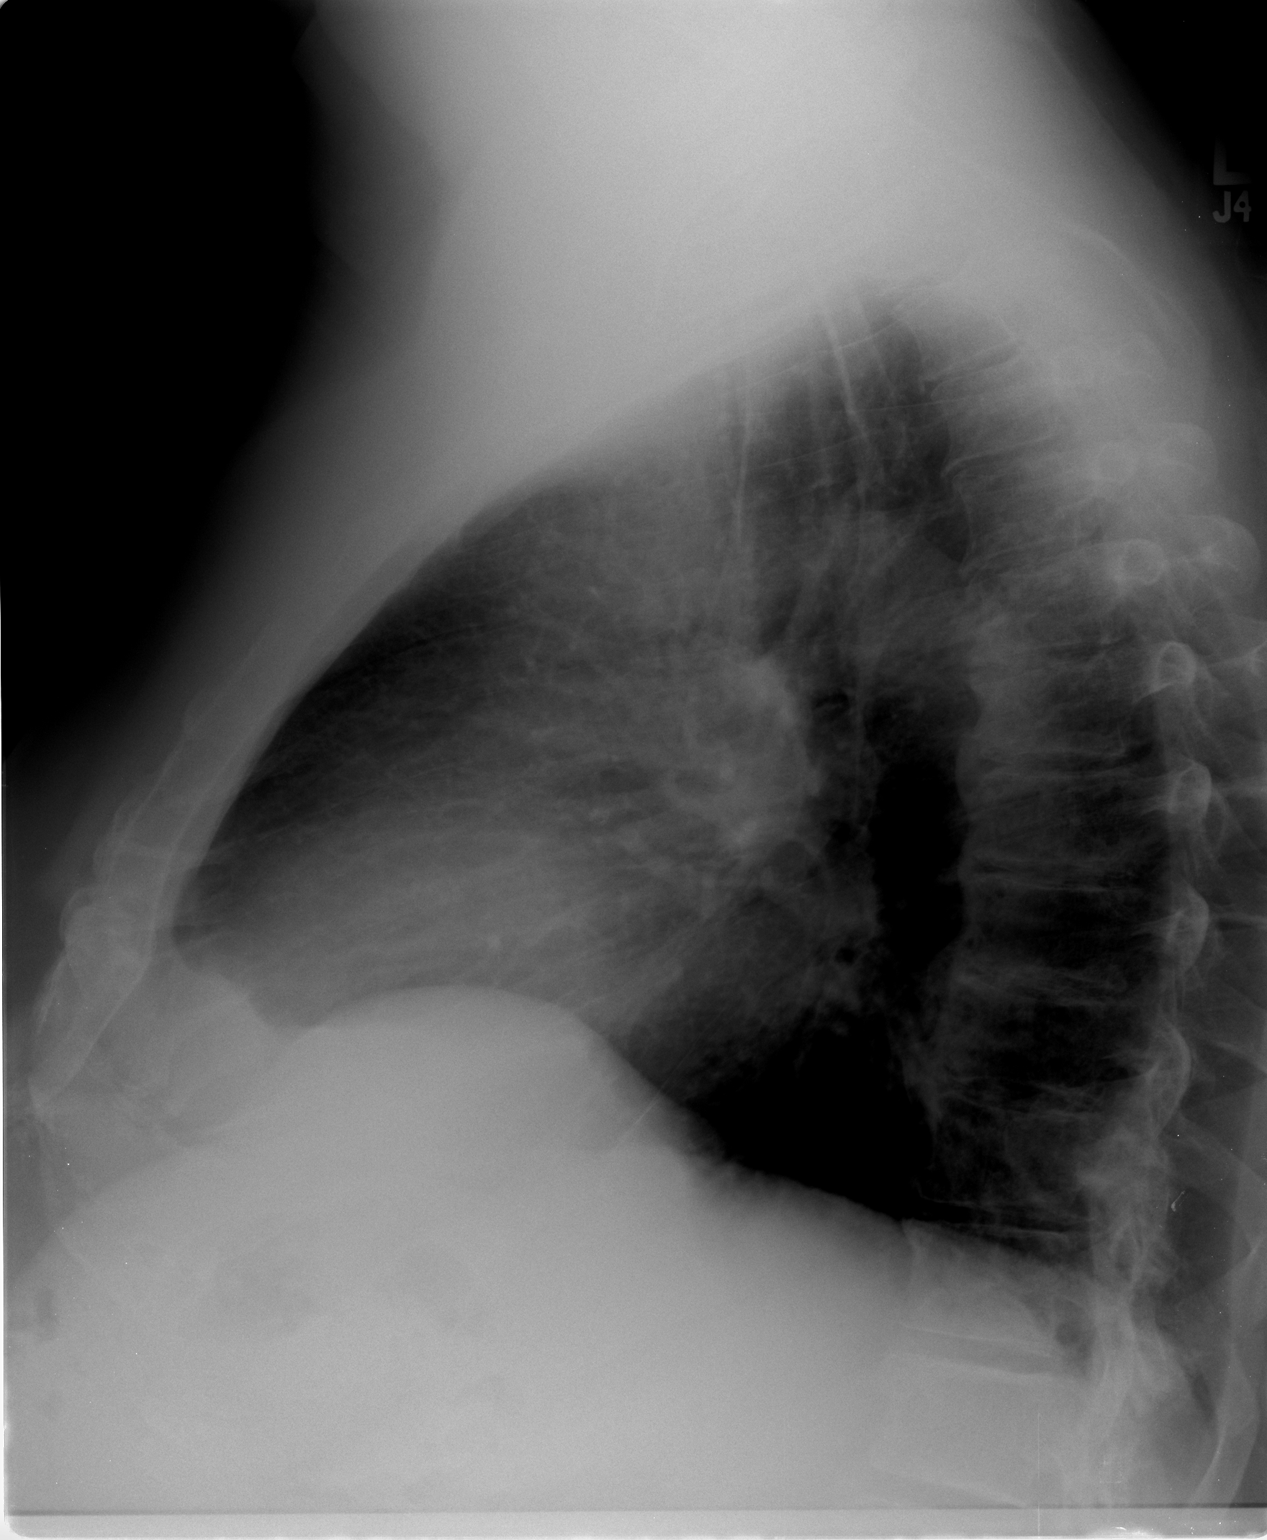

[2 of 2 positions shown; findings below may reference images not displayed]

FINDINGS: Suboptimal inspiration accounts for crowded
bronchovascular markings, especially in the lung bases, and
accentuates the cardiac silhouette.  Taking this into account,
cardiac silhouette normal in size and lungs clear.  Thoracic aorta
tortuous and atherosclerotic, unchanged.  Hilar and mediastinal
contours otherwise unremarkable.  No pleural effusions.
Degenerative changes throughout the thoracic spine.
IMPRESSION: Suboptimal inspiration.  No acute cardiopulmonary disease.

## 2010-09-11 ENCOUNTER — Inpatient Hospital Stay (HOSPITAL_COMMUNITY): Admission: RE | Admit: 2010-09-11 | Discharge: 2010-09-14 | Payer: Self-pay | Admitting: Orthopedic Surgery

## 2010-09-11 IMAGING — CR DG KNEE 1-2V PORT*R*
2 series · 2 of 2 positions shown · non-contrast
Comparison: None.

CLINICAL DATA: Right knee osteoarthritis

PORTABLE RIGHT KNEE - 1-2 VIEW

[view not recorded (1 of 2)]
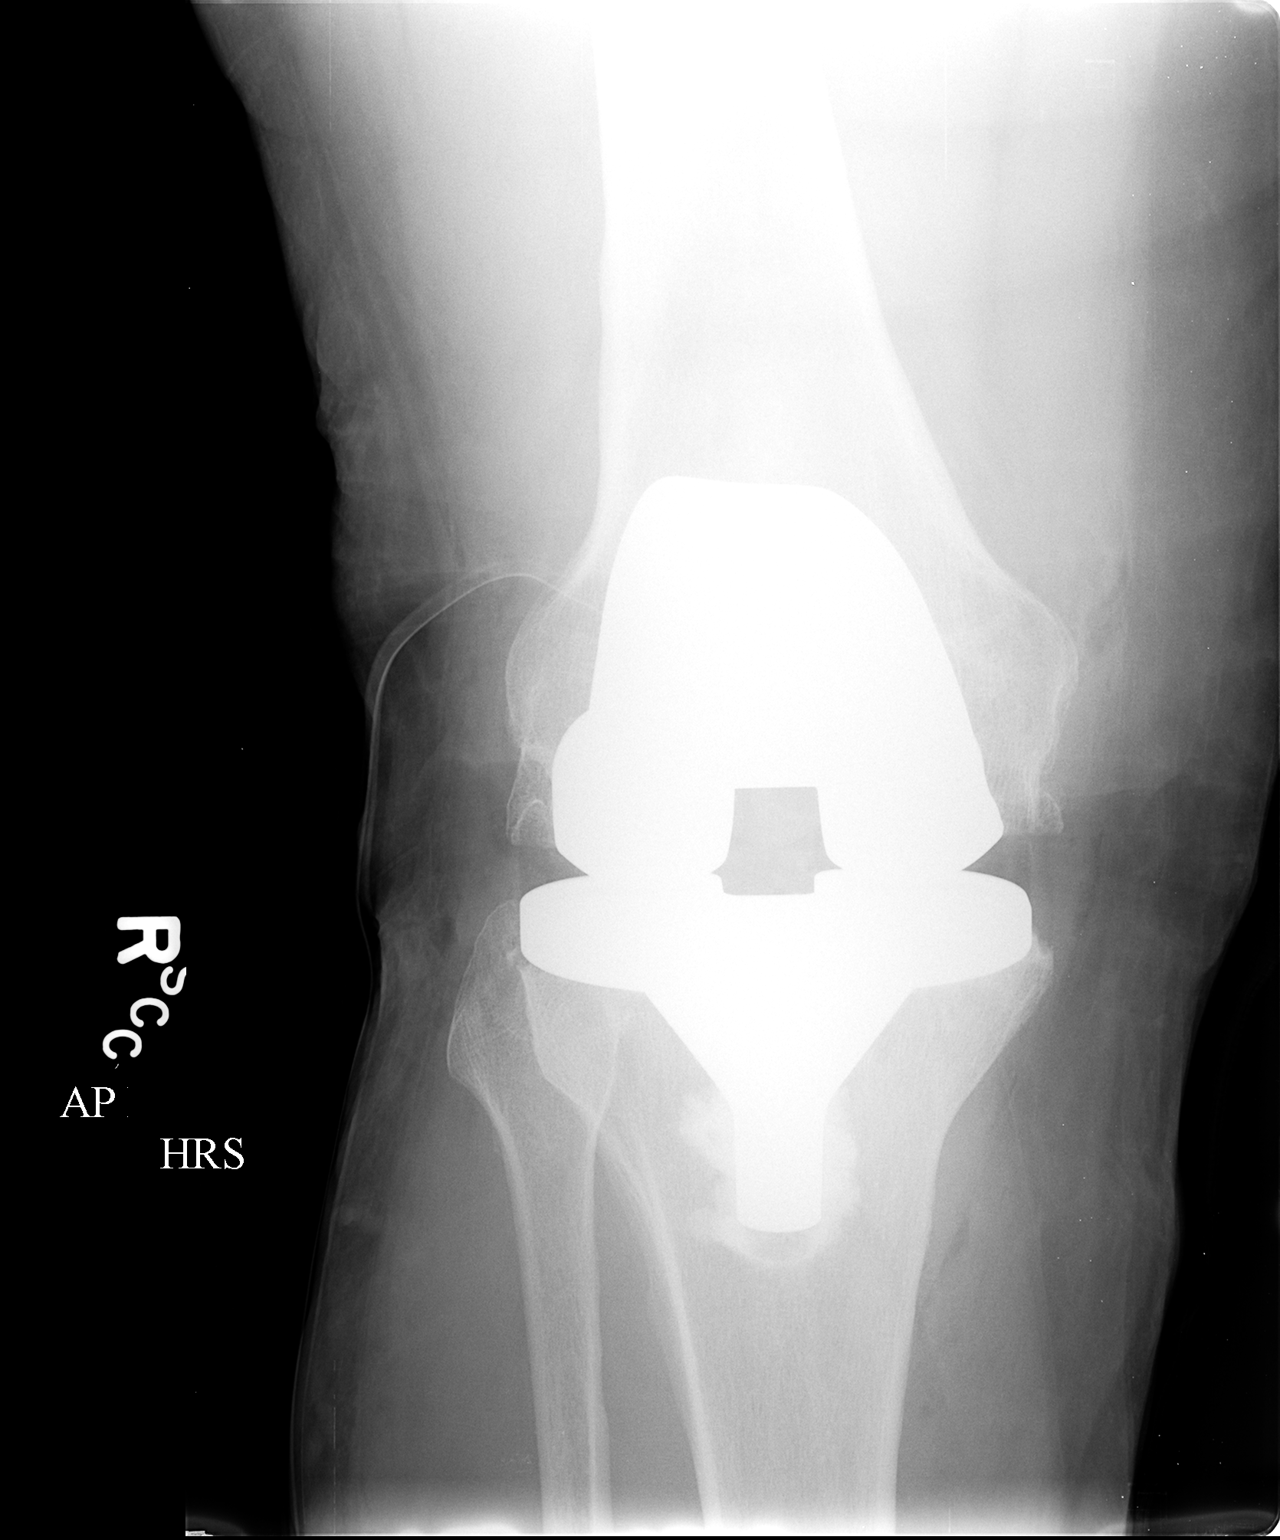

[view not recorded (2 of 2)]
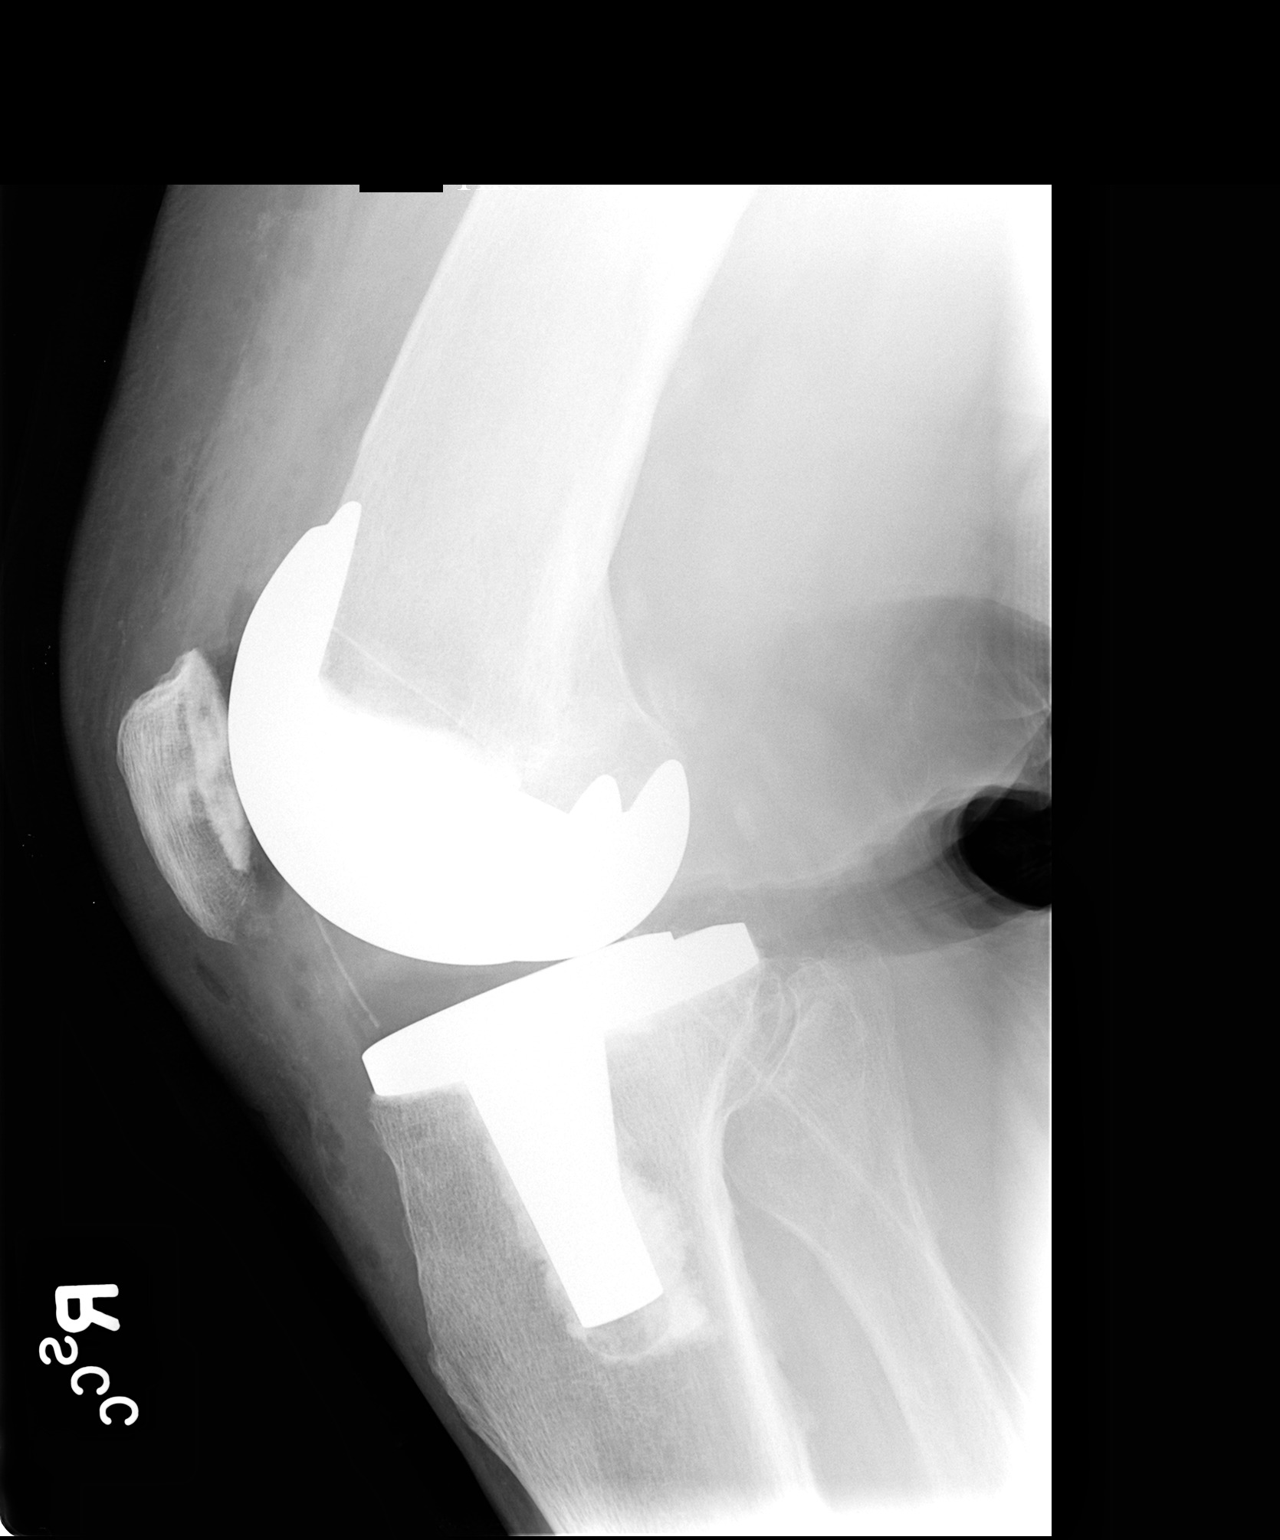

[2 of 2 positions shown; findings below may reference images not displayed]

FINDINGS: Right knee arthroplasty performed.  Components appear
aligned.  No acute hardware abnormality or acute osseous finding.
Surgical drain in place.  Postop changes of the soft tissues.
IMPRESSION: Expected postoperative appearance right knee arthroplasty

## 2010-10-07 ENCOUNTER — Emergency Department (HOSPITAL_COMMUNITY): Admission: EM | Admit: 2010-10-07 | Discharge: 2010-10-07 | Payer: Self-pay | Admitting: Family Medicine

## 2010-10-07 IMAGING — CT CT ANGIO CHEST
2 of 7 series · 19 of 36 positions shown · IV contrast (APPLIED)
Comparison: None.

CLINICAL DATA: Chest pain.

CT ANGIOGRAPHY CHEST WITH CONTRAST
TECHNIQUE: Multidetector CT imaging of the chest was performed
using the standard protocol during bolus administration of
intravenous contrast.  Multiplanar CT image reconstructions
including MIPs were obtained to evaluate the vascular anatomy.
Contrast:  180 ml of Omnipaque 350

[Series 11: pulm embolism 1.0 b25f thins · axial · 0.72mm/px · z∈[-16,+214]mm · 18 of 257 slices shown]
[im 13/257  lung]
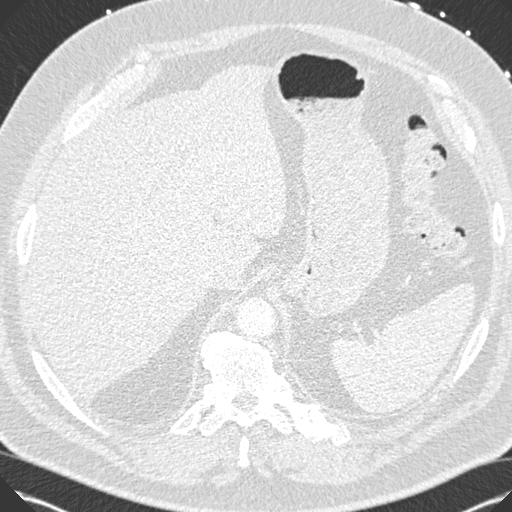
[im 26/257  mediastinal]
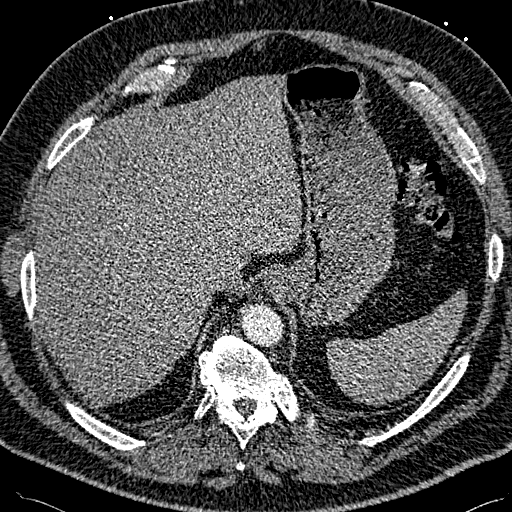
[im 39/257  lung]
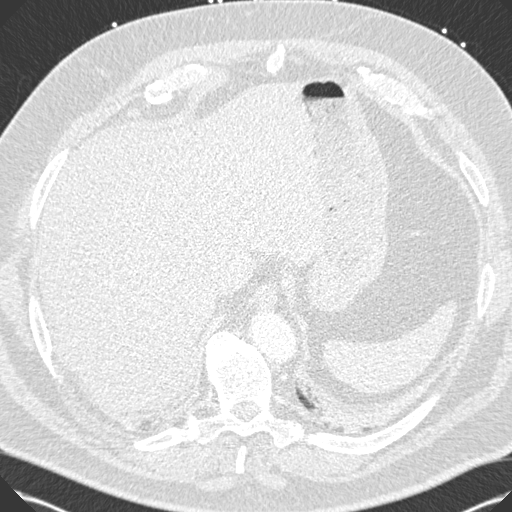
[im 52/257  mediastinal]
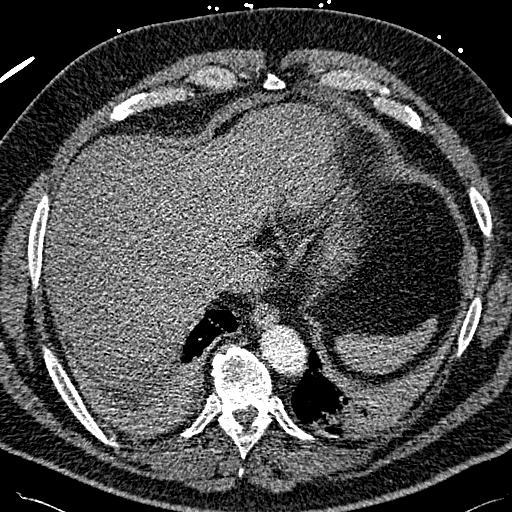
[im 65/257  lung]
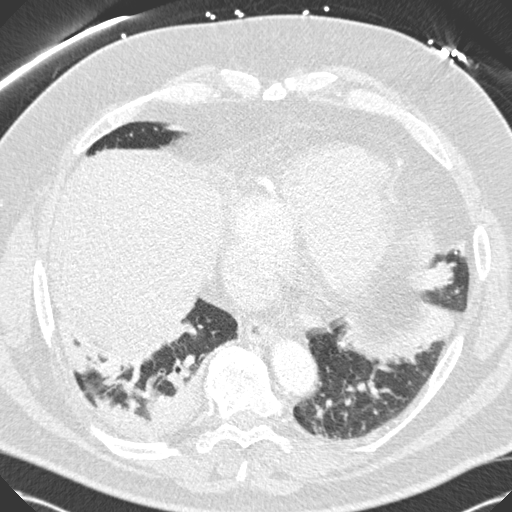
[im 77/257  mediastinal]
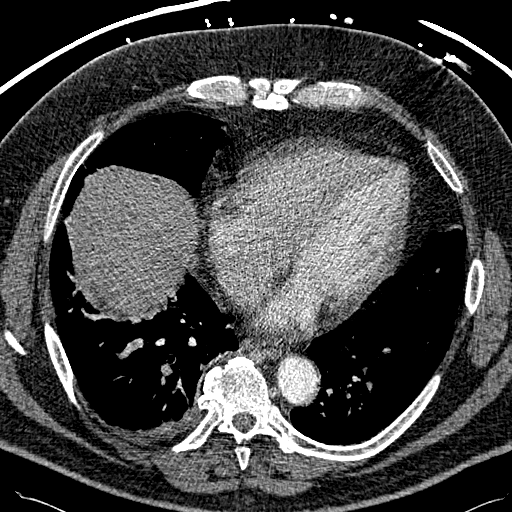
[im 90/257  lung]
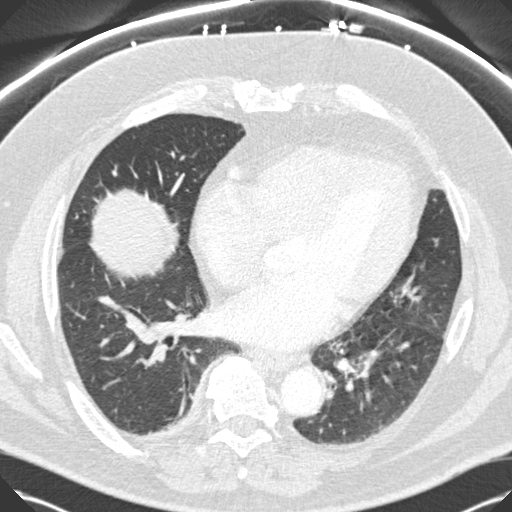
[im 103/257  mediastinal]
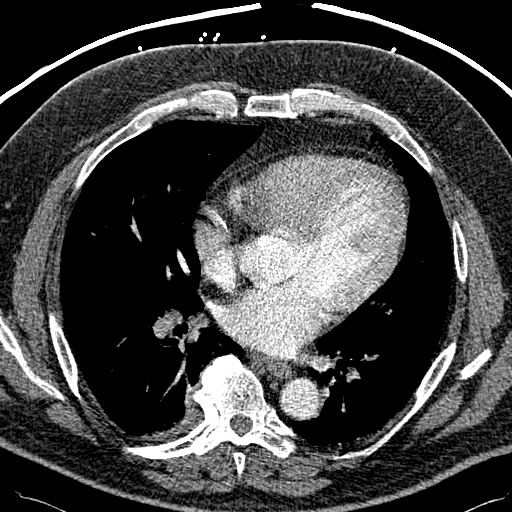
[im 116/257  lung]
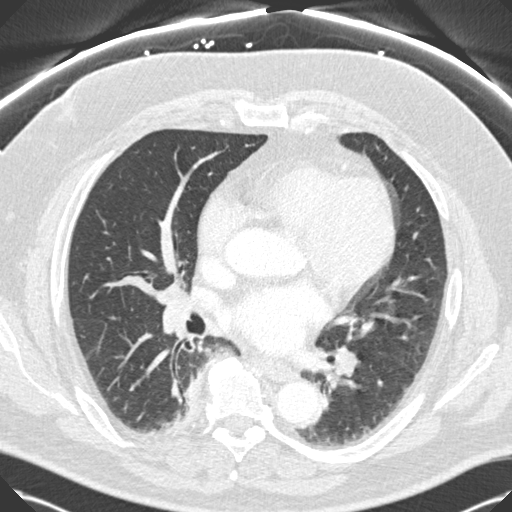
[im 141/257  mediastinal]
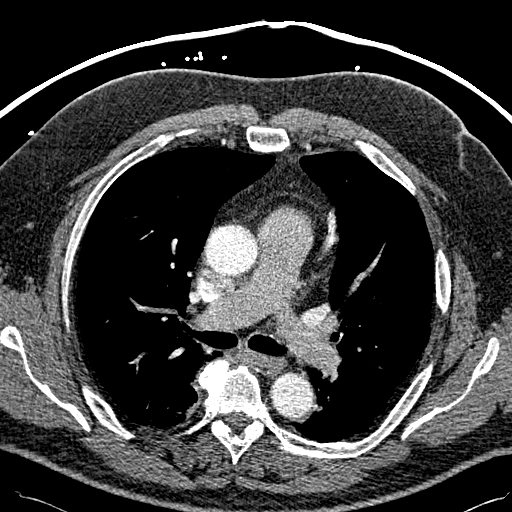
[im 154/257  lung]
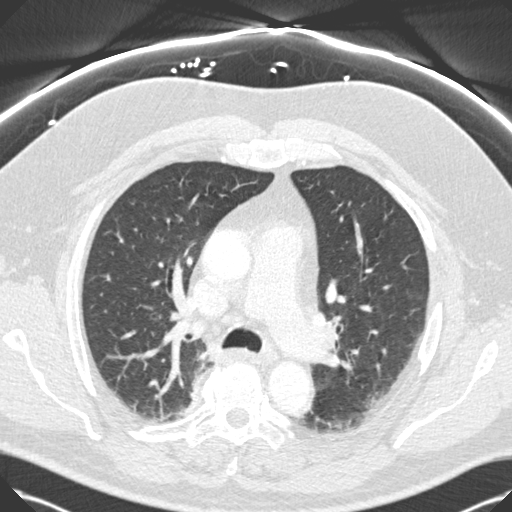
[im 167/257  mediastinal]
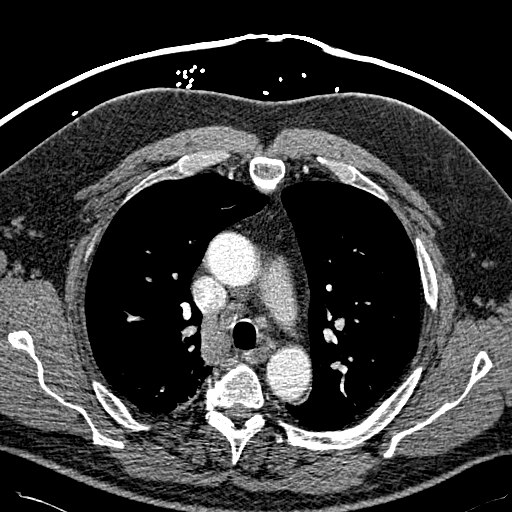
[im 180/257  lung]
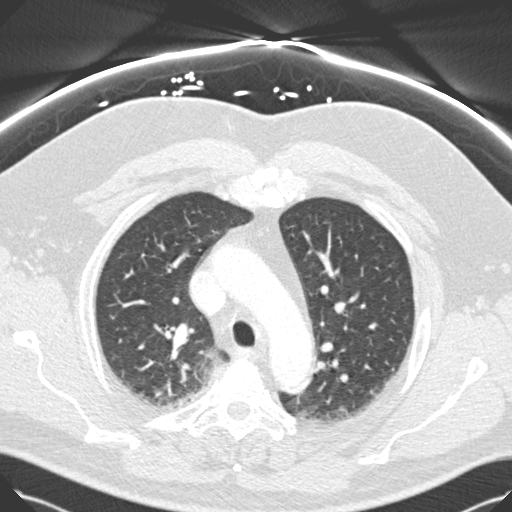
[im 193/257  mediastinal]
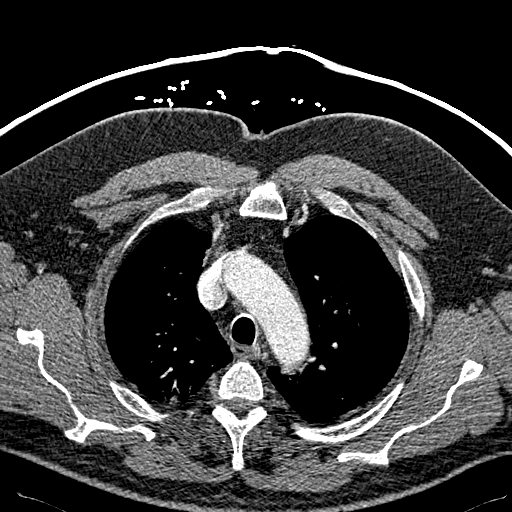
[im 205/257  lung]
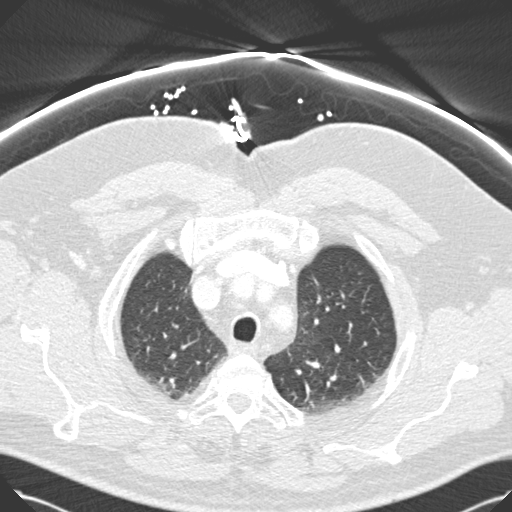
[im 218/257  mediastinal]
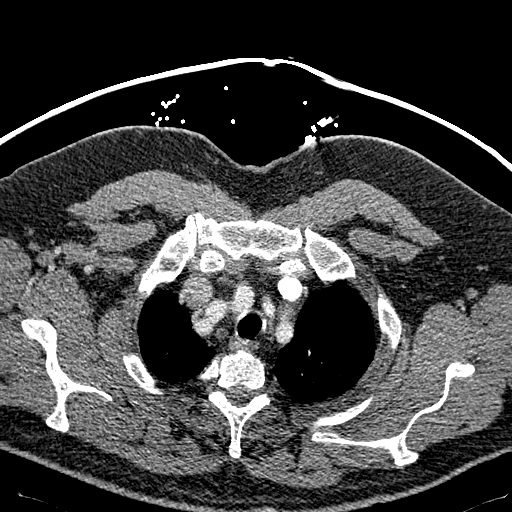
[im 231/257  lung]
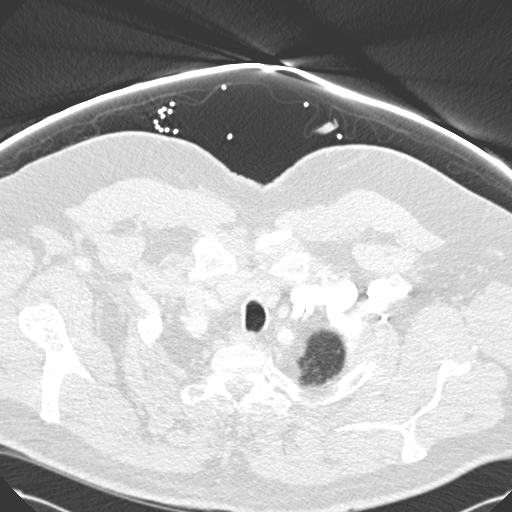
[im 244/257  mediastinal]
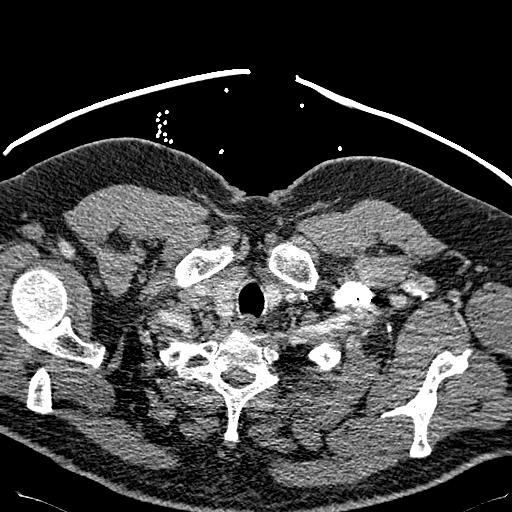

[Series 602: cor · coronal · 0.72mm/px · 1 of 141 slices shown]
[im 71/141  mediastinal]
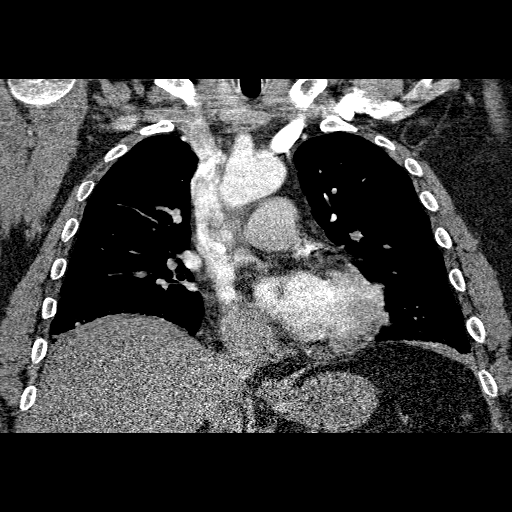

[19 of 36 positions shown; findings below may reference images not displayed]

FINDINGS: There is suboptimal opacification of the pulmonary
arterial system despite two attempts likely secondary to transit
interruption of contrast.  No central pulmonary embolism is seen.
The heart is at the upper limits of normal in size.  Coronary
calcifications are seen.  The aorta and major branch vessels are
patent.  No lymphadenopathy is seen in the mediastinum or hilar
regions.  No axillary adenopathy is present.  The thyroid is
normal.

Consolidation is seen at the lung bases, right greater than left
with otherwise clear lungs.  Mild  diffuse bronchial wall
thickening is noted.  No pleural fluid is seen.

The visualized upper abdomen is normal.  Degenerative changes are
seen throughout the visualized spine.  There are no aggressive
lytic or blastic bone lesions seen.  Remote posterior lateral
segmental left tenth rib fractures are seen.

Review of the MIP images confirms the above findings.
IMPRESSION: 1.  Suboptimal evaluation of pulmonary tear system secondary to
transit interruption of contrast despite multiple attempts at
scanning.  No central pulmonary embolism is seen.  If further
evaluation is warranted, rehydration and repeat chest CT versus VQ
scan may be of use.
2.  Bibasilar consolidation, right greater than left concerning for
infection.

## 2010-10-08 ENCOUNTER — Inpatient Hospital Stay (HOSPITAL_COMMUNITY): Admission: EM | Admit: 2010-10-08 | Discharge: 2010-10-09 | Payer: Self-pay | Admitting: Emergency Medicine

## 2010-10-08 ENCOUNTER — Ambulatory Visit: Payer: Self-pay | Admitting: Vascular Surgery

## 2010-10-08 ENCOUNTER — Encounter (INDEPENDENT_AMBULATORY_CARE_PROVIDER_SITE_OTHER): Payer: Self-pay | Admitting: Internal Medicine

## 2010-12-12 NOTE — Assessment & Plan Note (Signed)
Summary: 1 YR F/U PER CHECKOUT ON 01/18/09/TG   Visit Type:  Follow-up Primary Provider:  Dr. Elfredia Nevins   History of Present Illness: 60 year old male presents for a followup visit. He reports doing well without any significant angina. His main complaint is of significant arthritic pain, principally in his right knee. He states that he is planning to undergo right knee replacement over the next 6 months.  Last ischemic assessment was via adenosine Myoview in February 2009 demonstrating LVEF 55% with no significant ischemia.  He is due for a followup lipid profile and liver function tests.  He also mentions that he has had frequent nocturia over the last several months. He has not had a follow up visit with Dr. Sherwood Gambler for some time.  Current Medications (verified): 1)  Altace 10 Mg Caps (Ramipril) .... Take 1 Capsule By Mouth Once A Day 2)  Atenolol 50 Mg Tabs (Atenolol) .... Take 1 Tablet By Mouth Once A Day 3)  Plavix 75 Mg Tabs (Clopidogrel Bisulfate) .... Take 1 Tablet By Mouth Once A Day 4)  Ecotrin 325 Mg Tbec (Aspirin) .... Take 1 Tab Daily 5)  Nitrostat 0.4 Mg Subl (Nitroglycerin) .... Take As Needed For Chest Pain 6)  Vytorin 10-80 Mg Tabs (Ezetimibe-Simvastatin) .... Take 1 Tab Daily 7)  Celebrex 200 Mg Caps (Celecoxib) .... Take 1 Tab Daily 8)  Hydrochlorothiazide 25 Mg Tabs (Hydrochlorothiazide) .... Take 1/2 Tab Daily 9)  Potassium 95 Mg Cr-Tabs (Potassium Gluconate) .... Take 1 Tab Daily 10)  Ra Fish Oil 1000 Mg Caps (Omega-3 Fatty Acids) .... Take 1 Cap Daily 11)  Coenzyme Q10 100 Mg Caps (Coenzyme Q10) .... Take 1 Tab Daily  Allergies (verified): No Known Drug Allergies  Past History:  Past Medical History: Last updated: 01/02/2010 CAD - DES ramus 2005, LVEF 55% Myocardial Infarction - [facility] 2005 Morbid obesity Hyperlipidemia Arthritis Hypertension  Social History: Last updated: 01/02/2010 Tobacco Use - No.  Alcohol Use - no   Review of  Systems       The patient complains of weight gain.  The patient denies anorexia, fever, vision loss, chest pain, syncope, dyspnea on exertion, peripheral edema, prolonged cough, melena, hematochezia, and severe indigestion/heartburn.         Otherwise reviewed and negative except as outlined above.  Vital Signs:  Patient profile:   60 year old male Height:      74 inches Weight:      325 pounds BMI:     41.88 Pulse rate:   67 / minute BP sitting:   135 / 83  (right arm)  Vitals Entered By: Dreama Saa, CNA (January 03, 2010 11:41 AM)  Physical Exam  Additional Exam:  Morbidly obese male in no acute distress. HEENT: Conjunctiva and lids normal, oropharynx with moist mucosa. Neck: Supple, no carotid bruits, no thyromegaly. Lungs: Clear to auscultation, nonlabored. Cardiac: Indistinct PMI, regular rate and rhythm, no S3 or rub. Abdomen: Obese, nontender, bowel sounds present. Skin: Warm and dry. Extremities: No pitting edema, distal pulses one plus. Musculoskeletal: No kyphosis. Neuropsychiatric: Alert and oriented x3, affect appropriate.   Impression & Recommendations:  Problem # 1:  CORONARY ATHEROSCLEROSIS NATIVE CORONARY ARTERY (ICD-414.01)  Symptomatically stable on present medical regimen. Electrocardiogram is also stable. Last ischemic workup was 2 years ago. Plan at this point is to continue medical therapy and observation with annual followup. We discussed exercise and weight loss today.  His updated medication list for this problem includes:    Altace  10 Mg Caps (Ramipril) .Marland Kitchen... Take 1 capsule by mouth once a day    Atenolol 50 Mg Tabs (Atenolol) .Marland Kitchen... Take 1 tablet by mouth once a day    Plavix 75 Mg Tabs (Clopidogrel bisulfate) .Marland Kitchen... Take 1 tablet by mouth once a day    Ecotrin 325 Mg Tbec (Aspirin) .Marland Kitchen... Take 1 tab daily    Nitrostat 0.4 Mg Subl (Nitroglycerin) .Marland Kitchen... Take as needed for chest pain  Problem # 2:  ESSENTIAL HYPERTENSION, BENIGN  (ICD-401.1)  Blood pressure fairly well controlled.  His updated medication list for this problem includes:    Altace 10 Mg Caps (Ramipril) .Marland Kitchen... Take 1 capsule by mouth once a day    Atenolol 50 Mg Tabs (Atenolol) .Marland Kitchen... Take 1 tablet by mouth once a day    Ecotrin 325 Mg Tbec (Aspirin) .Marland Kitchen... Take 1 tab daily    Hydrochlorothiazide 25 Mg Tabs (Hydrochlorothiazide) .Marland Kitchen... Take 1/2 tab daily  Problem # 3:  OBESITY-MORBID (>100') (ICD-278.01)  We have discussed weight loss. He has difficulty with regular exercise, primarily related to his arthritis. Hopefully he will be able to be more mobile following right knee replacement later this year.  Problem # 4:  HYPERLIPIDEMIA-MIXED (ICD-272.4)  Patient due for a followup lipid profile and liver function tests. These will be arranged.  His updated medication list for this problem includes:    Vytorin 10-80 Mg Tabs (Ezetimibe-simvastatin) .Marland Kitchen... Take 1 tab daily  Orders: T-Hepatic Function [phone]) T-Lipid Profile [phone])  Problem # 5:  FREQUENCY, URINARY (ICD-788.41)  Principally nocturia. I asked Mr. Buckle to arrange a visit with Dr. Sherwood Gambler for further evaluation. This could be related to prostatic hypertrophy, although other endocrine abnormalities may need to be considered.  Patient Instructions: 1)  Your physician recommends that you schedule a follow-up appointment in: 1 year 2)  Your physician recommends that you return for lab work This week 3)  ****Reminder**** Please make appointment with Dr. Sherwood Gambler. Prescriptions: HYDROCHLOROTHIAZIDE 25 MG TABS (HYDROCHLOROTHIAZIDE) take 1/2 tab daily  #45 x 3   Entered by:   Larita Fife Via LPN   Authorized by:   Loreli Slot, MD, [facility]   Signed by:   Larita Fife Via LPN on 57/84/6962   Method used:   Print then Give to Patient   RxID:   [id number] VYTORIN 10-80 MG TABS (EZETIMIBE-SIMVASTATIN) take 1 tab daily  #90 x 3   Entered by:   Larita Fife Via LPN   Authorized by:   Loreli Slot, MD, [facility]   Signed by:   Larita Fife Via LPN on 53/66/4403   Method used:   Print then Give to Patient   RxID:   [id number] PLAVIX 75 MG TABS (CLOPIDOGREL BISULFATE) Take 1 tablet by mouth once a day  #90 x 3   Entered by:   Larita Fife Via LPN   Authorized by:   Loreli Slot, MD, [facility]   Signed by:   Larita Fife Via LPN on 29/51/8841   Method used:   Print then Give to Patient   RxID:   [id number] ATENOLOL 50 MG TABS (ATENOLOL) Take 1 tablet by mouth once a day  #90 x 3   Entered by:   Larita Fife Via LPN   Authorized by:   Loreli Slot, MD, [facility]   Signed by:   Larita Fife Via LPN on 57/32/2025   Method used:   Print then Give to Patient   RxID:   [id number] ALTACE 10 MG CAPS (RAMIPRIL) Take 1 capsule by mouth  once a day  #90 x 3   Entered by:   Larita Fife Via LPN   Authorized by:   Loreli Slot, MD, [facility]   Signed by:   Larita Fife Via LPN on 16/08/9603   Method used:   Print then Give to Patient   RxID:   [id number]

## 2010-12-12 NOTE — Letter (Signed)
Summary: [place] [facility]  [address], [place] [address]   Phone: [phone]  Fax: [phone]      January 08, 2010 MRN: [id number]   GIVANNI STARON [address] [place], [place]  [address]   Dear Mr. HABEEB,  Your test ordered by Selena Batten has been reviewed by your physician (or physician assistant) and was found to be normal or stable. Your physician (or physician assistant) felt no changes were needed at this time.  ____ Echocardiogram  ____ Cardiac Stress Test  __X__ Lab Work  ____ Peripheral vascular study of arms, legs or neck  ____ CT scan or X-ray  ____ Lung or Breathing test  ____ Other:   Thank you.  Nona Dell, MD, F.A.C.C

## 2010-12-12 NOTE — Letter (Signed)
Summary: [place] [facility] (Nuc Med Stress)  [facility]  [address] [place], [place] [address]   Phone: [phone]  Fax: [phone]    Nuclear Medicine 1-Day Stress Test Information Sheet  Re:     Evan Warner   DOB:     1951/05/01 MRN:     [id number] Weight:  Appointment Date: Register at: Appointment Time: Referring MD:  ___Exercise Stress  __Adenosine   __Dobutamine  _X_Lexiscan  __Persantine   __Thallium  Urgency: ____1 (next day)   ____2 (one week)    ____3 (PRN)  Patient will receive Follow Up call with results: Patient needs follow-up appointment:  Instructions regarding medication:  How to prepare for your stress test: 1. DO NOT eat or drink after midnight the night before test. This includes no caffeine (coffee, tea, sodas, chocolate) if you were instructed to take your medications, drink water with it. 2. DO NOT use any tobacco products for at leaset 8 hours prior to arrival. 3. DO NOT wear dresses or any clothing that may have metal clasps or buttons. 4. Wear short sleeve shirts, loose clothing, and comfortalbe walking shoes. 5. DO NOT use lotions, oils or powder on your chest before the test. 6. The test will take approximately 3-4 hours from the time you arrive until completion. 7. To register the day of the test, go to the Short Stay entrance at [facility]. 8. If you must cancel your test, call [phone] as soon as you are aware. 9. Please take your medications as you normally do the morning of test.  After you arrive for test:   When you arrive at [facility], you will go to Short Stay to be registered. They will then send you to Radiology to check in. The Nuclear Medicine Tech will get you and start an IV in your arm or hand. A small amount of a radioactive tracer will then be injected into your IV. This tracer will then have to circulate for 30-45 minutes. During this time you will wait in the waiting room and you will be  able to drink something without caffeine. A series of pictures will be taken of your heart follwoing this waiting period. After the 1st set of pictures you will go to the stress lab to get ready for your stress test. During the stress test, another small amount of a radioactive tracer will be injected through your IV. When the stress test is complete, there is a short rest period while your heart rate and blood pressure will be monitored. When this monitoring period is complete you will have another set of pictrues taken. (The same as the 1st set of pictures). These pictures are taken between 15 minutes and 1 hour after the stress test. The time depends on the type of stress test you had. Your doctor will inform you of your test results within 7 days after test.    The possibilities of certain changes are possible during the test. They include abnormal blood pressure and disorders of the heart. Side effects of persantine or adenosine can include flushing, chest pain, shortness of breath, stomach tightness, headache and light-headedness. These side effects usually do not last long and are self-resolving. Every effort will be made to keep you comfortable and to minimize complications by obtaining a medical history and by close observation during the test. Emergency equipment, medications, and trained personnel are available to deal with any unusual situation which may arise.  Please notify office  at least 48 hours in advance if you are unable to keep this appt.

## 2010-12-12 NOTE — Progress Notes (Signed)
Summary: when should pt stop blood thinners  Phone Note Call from Patient Call back at [phone]   Caller: pt Reason for Call: Talk to Nurse Summary of Call: pt is schedules for knee surgery 09/11/10 but no one has ever told him when to stop his asprin and plavix Initial call taken by: Faythe Ghee,  September 03, 2010 2:04 PM  Follow-up for Phone Call        Please advise. Follow-up by: Larita Fife Via LPN,  September 03, 2010 3:24 PM  Additional Follow-up for Phone Call Additional follow up Details #1::        He should contact the surgeon's office and ask when they would like him to stop aspirin and Plavix in light of operation date.  As noted already, he should be able to hold these temporarily from a cardiac perspective. Additional Follow-up by: Loreli Slot, MD, [facility],  September 04, 2010 10:45 AM

## 2010-12-12 NOTE — Assessment & Plan Note (Signed)
Summary: SURG KNEE REPLACEMENT   Visit Type:  Follow-up Primary Provider:  Dr. Elfredia Nevins   History of Present Illness: 60 year old male presents for followup. I saw him back in February, at which time he was clinically stable. Followup labs from that time demonstrated cholesterol 130, triglycerides 103, HDL 53, LDL 56, AST 19, ALT 20.  He is being considered for right knee replacement surgery through [facility], under general anesthesia, scheduled for November 1. He reports no anginal chest pain, however occasional dyspnea on exertion. Last ischemic evaluation was greater than 2 years ago at this point. He reports compliance with medications.  Activity limited by arthritic knee pain.   Current Medications (verified): 1)  Altace 10 Mg Caps (Ramipril) .... Take 1 Capsule By Mouth Once A Day 2)  Atenolol 50 Mg Tabs (Atenolol) .... Take 1 Tablet By Mouth Once A Day 3)  Plavix 75 Mg Tabs (Clopidogrel Bisulfate) .... Take 1 Tablet By Mouth Once A Day 4)  Ecotrin 325 Mg Tbec (Aspirin) .... Take 1 Tab Daily 5)  Nitrostat 0.4 Mg Subl (Nitroglycerin) .... Take As Needed For Chest Pain 6)  Vytorin 10-80 Mg Tabs (Ezetimibe-Simvastatin) .... Take 1 Tab Daily 7)  Celebrex 200 Mg Caps (Celecoxib) .... Take 1 Tab Daily 8)  Hydrochlorothiazide 25 Mg Tabs (Hydrochlorothiazide) .... Take 1/2 Tab Daily 9)  Potassium 95 Mg Cr-Tabs (Potassium Gluconate) .... Take 1 Tab Daily 10)  Ra Fish Oil 1000 Mg Caps (Omega-3 Fatty Acids) .... Take 1 Cap Daily 11)  Coenzyme Q10 100 Mg Caps (Coenzyme Q10) .... Take 1 Tab Daily 12)  Tamsulosin Hcl 0.4 Mg Caps (Tamsulosin Hcl) .... Take 1 Tab At Bedtime  Allergies (verified): No Known Drug Allergies  Past History:  Past Medical History: Last updated: 01/02/2010 CAD - DES ramus 2005, LVEF 55% Myocardial Infarction - [facility] 2005 Morbid obesity Hyperlipidemia Arthritis Hypertension  Social History: Last updated: 01/02/2010 Tobacco Use - No.    Alcohol Use - no  Review of Systems       The patient complains of dyspnea on exertion.  The patient denies anorexia, fever, weight loss, chest pain, syncope, peripheral edema, prolonged cough, melena, and hematochezia.         Otherwise reviewed and negative.  Vital Signs:  Patient profile:   60 year old male Weight:      343 pounds BMI:     44.20 Pulse rate:   85 / minute BP sitting:   118 / 74  (right arm)  Vitals Entered By: Dreama Saa, CNA (August 14, 2010 2:17 PM)  Physical Exam  Additional Exam:  Morbidly obese male in no acute distress. HEENT: Conjunctiva and lids normal, oropharynx with moist mucosa. Neck: Supple, no carotid bruits, no thyromegaly. Lungs: Clear to auscultation, nonlabored. Cardiac: Indistinct PMI, regular rate and rhythm, no S3 or rub. Abdomen: Obese, nontender, bowel sounds present. Skin: Warm and dry. Extremities: No pitting edema, distal pulses one plus. Musculoskeletal: No kyphosis. Neuropsychiatric: Alert and oriented x3, affect appropriate.   EKG  Procedure date:  08/14/2010  Findings:      Sus rhythm at 71 beats per minutes, poor anteroseptal R-wave progression, nonspecific T-wave changes.  Prior Report Reviewed for Nuclear Study:  Findings: 12/21/2007 IMPRESSION:   Adenosine Myoview electrically negative for ischemia.  Myoview scan   with probable normal perfusion and soft tissue attenuation as noted   above.  LVF on gating calculated at 55%.  No evidence for significant   ischemia.  Impression &  Recommendations:  Problem # 1:  PREOPERATIVE EXAMINATION (ICD-V72.84)  Patient clinically stable from a cardiac perspective, being considered for elective right total knee replacement under general anesthesia. He has a history of drug eluding stent placement to the ramus in 2005, with last ischemic assessment being greater than 2 years ago. No active chest pain, but some dyspnea on exertion at times. Plan will be to proceed with a  Lexiscan Cardiolite on medical therapy. If low risk, I would anticipate that he should be able to proceed with surgery at an acceptable perioperative cardiovascular risk. He should be able temporarily hold aspirin and Plavix if needed. If he is placed on Coumadin following surgery, would not reinstitute Plavix until he has continued therapy with Coumadin, as being on triple therapy would place his bleeding risk fairly high.  Problem # 2:  CORONARY ATHEROSCLEROSIS NATIVE CORONARY ARTERY (ICD-414.01)  Continue medical therapy and followup Lexiscan Myoview as outlined above. Anticipate routine followup in 6 months.  His updated medication list for this problem includes:    Altace 10 Mg Caps (Ramipril) .Marland Kitchen... Take 1 capsule by mouth once a day    Atenolol 50 Mg Tabs (Atenolol) .Marland Kitchen... Take 1 tablet by mouth once a day    Plavix 75 Mg Tabs (Clopidogrel bisulfate) .Marland Kitchen... Take 1 tablet by mouth once a day    Ecotrin 325 Mg Tbec (Aspirin) .Marland Kitchen... Take 1 tab daily    Nitrostat 0.4 Mg Subl (Nitroglycerin) .Marland Kitchen... Take as needed for chest pain  Orders: Nuclear Stress Test (Nuc Stress Test)  Problem # 3:  HYPERLIPIDEMIA-MIXED (ICD-272.4)  Well-controlled on current regimen.  His updated medication list for this problem includes:    Vytorin 10-80 Mg Tabs (Ezetimibe-simvastatin) .Marland Kitchen... Take 1 tab daily  Problem # 4:  ESSENTIAL HYPERTENSION, BENIGN (ICD-401.1)  Blood pressure well controlled.  His updated medication list for this problem includes:    Altace 10 Mg Caps (Ramipril) .Marland Kitchen... Take 1 capsule by mouth once a day    Atenolol 50 Mg Tabs (Atenolol) .Marland Kitchen... Take 1 tablet by mouth once a day    Ecotrin 325 Mg Tbec (Aspirin) .Marland Kitchen... Take 1 tab daily    Hydrochlorothiazide 25 Mg Tabs (Hydrochlorothiazide) .Marland Kitchen... Take 1/2 tab daily  Orders: Nuclear Stress Test (Nuc Stress Test)  Patient Instructions: 1)  Your physician recommends that you schedule a follow-up appointment in: 6 months 2)  Your physician  recommends that you continue on your current medications as directed. Please refer to the Current Medication list given to you today. 3)  Your physician has requested that you have an [employer]  For further information please visit [email or web address]  Please follow instruction sheet, as given.

## 2010-12-12 NOTE — Letter (Signed)
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 [place] [address]   Phone: [phone]  Fax: [phone]     August 24, 2010    Evan Warner   The above named patient had a medical visit today at: 9:15     am / pm. that lasted 3-4 hours.  Please take this into consideration when reviewing the time away from work/school.      Sincerely yours,  Architectural technologist

## 2010-12-27 ENCOUNTER — Encounter: Payer: Self-pay | Admitting: Cardiology

## 2010-12-27 ENCOUNTER — Ambulatory Visit (INDEPENDENT_AMBULATORY_CARE_PROVIDER_SITE_OTHER): Payer: BC Managed Care – PPO | Admitting: Cardiology

## 2010-12-27 DIAGNOSIS — E782 Mixed hyperlipidemia: Secondary | ICD-10-CM

## 2010-12-27 DIAGNOSIS — I2510 Atherosclerotic heart disease of native coronary artery without angina pectoris: Secondary | ICD-10-CM

## 2010-12-27 DIAGNOSIS — I10 Essential (primary) hypertension: Secondary | ICD-10-CM

## 2010-12-28 ENCOUNTER — Encounter: Payer: Self-pay | Admitting: Cardiology

## 2011-01-02 NOTE — Assessment & Plan Note (Signed)
Summary: 1 yr f/u per checkout on 01/03/10/tg   Visit Type:  Follow-up Primary Provider:  Dr. Elfredia Nevins   History of Present Illness: 60 year old male presents for followup. He was seen in October 2011, subsequently undergoing right knee replacement. Aspirin and Plavix were held temporarily. Preoperative workup included a [employer], outlined below.  He states that he did very well with surgery, now back to work. He hopes to start exercising more regularly, and is already seeing a nutritionist, trying to lose weight. He denies any angina or unusual shortness of breath. He reports compliance with his medications.  Current Medications (verified): 1)  Altace 10 Mg Caps (Ramipril) .... Take 1 Capsule By Mouth Once A Day 2)  Atenolol 50 Mg Tabs (Atenolol) .... Take 1 Tablet By Mouth Once A Day 3)  Plavix 75 Mg Tabs (Clopidogrel Bisulfate) .... Take 1 Tablet By Mouth Once A Day 4)  Ecotrin 325 Mg Tbec (Aspirin) .... Take 1 Tab Daily 5)  Nitrostat 0.4 Mg Subl (Nitroglycerin) .... Take As Needed For Chest Pain 6)  Vytorin 10-80 Mg Tabs (Ezetimibe-Simvastatin) .... Take 1 Tab Daily 7)  Celebrex 200 Mg Caps (Celecoxib) .... Take 1 Tab Daily 8)  Hydrochlorothiazide 25 Mg Tabs (Hydrochlorothiazide) .... Take 1/2 Tab Daily 9)  Potassium 95 Mg Cr-Tabs (Potassium Gluconate) .... Take 1 Tab Daily 10)  Ra Fish Oil 1000 Mg Caps (Omega-3 Fatty Acids) .... Take 1 Cap Daily 11)  Coenzyme Q10 100 Mg Caps (Coenzyme Q10) .... Take 1 Tab Daily 12)  Tamsulosin Hcl 0.4 Mg Caps (Tamsulosin Hcl) .... Take 1 Tab At Bedtime  Allergies (verified): No Known Drug Allergies  Comments:  Nurse/Medical Assistant: patient brought med list needs 90 day prescriptions for all meds  Past History:  Past Medical History: Last updated: 01/02/2010 CAD - DES ramus 2005, LVEF 55% Myocardial Infarction - [facility] 2005 Morbid obesity Hyperlipidemia Arthritis Hypertension  Social History: Last updated:  01/02/2010 Tobacco Use - No.  Alcohol Use - no  Past Surgical History: Left ankle arthroscopy Right knee replacement  Review of Systems  The patient denies anorexia, fever, weight gain, chest pain, syncope, dyspnea on exertion, peripheral edema, melena, hematochezia, and severe indigestion/heartburn.         Otherwise reviewed and negative except as outlined.  Vital Signs:  Patient profile:   60 year old male Weight:      338 pounds BMI:     43.55 Pulse rate:   62 / minute BP sitting:   140 / 82  (left arm)  Vitals Entered By: Dreama Saa, CNA (December 27, 2010 8:36 AM)  Physical Exam  Additional Exam:  Morbidly obese male in no acute distress. HEENT: Conjunctiva and lids normal, oropharynx with moist mucosa. Neck: Supple, no carotid bruits, no thyromegaly. Lungs: Clear to auscultation, nonlabored. Cardiac: Indistinct PMI, regular rate and rhythm, no S3 or rub. Abdomen: Obese, nontender, bowel sounds present. Skin: Warm and dry. Extremities: No pitting edema, distal pulses one plus. Musculoskeletal: No kyphosis. Neuropsychiatric: Alert and oriented x3, affect appropriate.   Nuclear Study  Procedure date:  12/27/2010  Findings:      IMPRESSION: Low risk Lexiscan Myoview as outlined.  No diagnostic ST-segment changes were noted.  There is evidence of soft tissue attenuation affecting the basal inferolateral wall and apical anteroseptal wall.  Normal wall motion is noted in these areas.  No clear evidence of ischemia.  LVEF 55%.  Impression & Recommendations:  Problem # 1:  CORONARY ATHEROSCLEROSIS NATIVE CORONARY ARTERY (  ICD-414.01)  Symptomatically stable. Plan to continue medical therapy, including dual antiplatelet therapy. Recent Myoview is outlined above, overall low risk. We will continue an annual followup pattern. I have encouraged him to see his primary care physician in the interim. Refills provided.  His updated medication list for this problem  includes:    Altace 10 Mg Caps (Ramipril) .Marland Kitchen... Take 1 capsule by mouth once a day    Atenolol 50 Mg Tabs (Atenolol) .Marland Kitchen... Take 1 tablet by mouth once a day    Plavix 75 Mg Tabs (Clopidogrel bisulfate) .Marland Kitchen... Take 1 tablet by mouth once a day    Ecotrin 325 Mg Tbec (Aspirin) .Marland Kitchen... Take 1 tab daily    Nitrostat 0.4 Mg Subl (Nitroglycerin) .Marland Kitchen... Take as needed for chest pain  Problem # 2:  OBESITY-MORBID (>100') (ICD-278.01)  We have discussed weight loss. He states that he is seeing a nutritionist, and plans to start increasing his exercise.  Problem # 3:  HYPERLIPIDEMIA-MIXED (ICD-272.4)  Followup lipid profile and liver function tests for his next visit.  His updated medication list for this problem includes:    Vytorin 10-80 Mg Tabs (Ezetimibe-simvastatin) .Marland Kitchen... Take 1 tab daily  His updated medication list for this problem includes:    Vytorin 10-80 Mg Tabs (Ezetimibe-simvastatin) .Marland Kitchen... Take 1 tab daily  Future Orders: T-Lipid Profile ([id number]) ... 06/17/2011 T-Hepatic Function [id number]) ... 12/09/2011  Problem # 4:  ESSENTIAL HYPERTENSION, BENIGN (ICD-401.1)  Blood pressure elevated. Continue to work toward weight loss.  His updated medication list for this problem includes:    Altace 10 Mg Caps (Ramipril) .Marland Kitchen... Take 1 capsule by mouth once a day    Atenolol 50 Mg Tabs (Atenolol) .Marland Kitchen... Take 1 tablet by mouth once a day    Ecotrin 325 Mg Tbec (Aspirin) .Marland Kitchen... Take 1 tab daily    Hydrochlorothiazide 25 Mg Tabs (Hydrochlorothiazide) .Marland Kitchen... Take 1/2 tab daily  His updated medication list for this problem includes:    Altace 10 Mg Caps (Ramipril) .Marland Kitchen... Take 1 capsule by mouth once a day    Atenolol 50 Mg Tabs (Atenolol) .Marland Kitchen... Take 1 tablet by mouth once a day    Ecotrin 325 Mg Tbec (Aspirin) .Marland Kitchen... Take 1 tab daily    Hydrochlorothiazide 25 Mg Tabs (Hydrochlorothiazide) .Marland Kitchen... Take 1/2 tab daily  Patient Instructions: 1)  Your physician recommends that you schedule a  follow-up appointment in: 1 year 2)  Your physician recommends that you return for lab work in: 1 year, just before next office visit 3)  Your physician recommends that you continue on your current medications as directed. Please refer to the Current Medication list given to you today. Prescriptions: HYDROCHLOROTHIAZIDE 25 MG TABS (HYDROCHLOROTHIAZIDE) take 1/2 tab daily  #45 x 3   Entered by:   Larita Fife Via LPN   Authorized by:   Loreli Slot, MD, [facility]   Signed by:   Larita Fife Via LPN on 02/72/5366   Method used:   Print then Give to Patient   RxID:   [id number] POTASSIUM 95 MG CR-TABS (POTASSIUM GLUCONATE) take 1 tab daily  #90 x 3   Entered by:   Larita Fife Via LPN   Authorized by:   Loreli Slot, MD, [facility]   Signed by:   Larita Fife Via LPN on 64/33/2951   Method used:   Print then Give to Patient   RxID:   [id number] VYTORIN 10-80 MG TABS (EZETIMIBE-SIMVASTATIN) take 1 tab daily  #90 x 3   Entered by:  Larita Fife Via LPN   Authorized by:   Loreli Slot, MD, [facility]   Signed by:   Larita Fife Via LPN on 65/78/4696   Method used:   Print then Give to Patient   RxID:   [id number] PLAVIX 75 MG TABS (CLOPIDOGREL BISULFATE) Take 1 tablet by mouth once a day  #90 x 3   Entered by:   Larita Fife Via LPN   Authorized by:   Loreli Slot, MD, [facility]   Signed by:   Larita Fife Via LPN on 25/36/6440   Method used:   Print then Give to Patient   RxID:   [id number] ATENOLOL 50 MG TABS (ATENOLOL) Take 1 tablet by mouth once a day  #90 x 3   Entered by:   Larita Fife Via LPN   Authorized by:   Loreli Slot, MD, [facility]   Signed by:   Larita Fife Via LPN on 32/95/1884   Method used:   Print then Give to Patient   RxID:   [id number] ALTACE 10 MG CAPS (RAMIPRIL) Take 1 capsule by mouth once a day  #90 x 3   Entered by:   Larita Fife Via LPN   Authorized by:   Loreli Slot, MD, [facility]   Signed by:   Larita Fife Via LPN on 55/73/2202   Method used:   Print then Give to Patient    RxID:   [id number]

## 2011-01-02 NOTE — Letter (Signed)
Summary: [place] [employer] Work Engineer, agricultural at [employer]  [address] [place] [address]   Phone: [phone]  Fax: [phone]     December 27, 2010 MRN: [id number]   SAGAR TENGAN [address] [place], [place]  [address]      YOUR LAB WORK IS DUE  December 09, 2011 _________________________________________  Please go to [facility], located across the street from [facility] on the second floor.  Hours are Monday - Friday 7am until 7:30pm         Saturday 8am until 12noon    _X_  DO NOT EAT OR DRINK AFTER MIDNIGHT EVENING PRIOR TO LABWORK  __ YOUR LABWORK IS NOT FASTING --YOU MAY EAT PRIOR TO LABWORK

## 2011-01-22 LAB — BASIC METABOLIC PANEL
BUN: 12 mg/dL (ref 6–23)
BUN: 13 mg/dL (ref 6–23)
BUN: 15 mg/dL (ref 6–23)
BUN: 6 mg/dL (ref 6–23)
CO2: 33 mEq/L — ABNORMAL HIGH (ref 19–32)
Chloride: 102 mEq/L (ref 96–112)
Chloride: 103 mEq/L (ref 96–112)
Chloride: 105 mEq/L (ref 96–112)
Creatinine, Ser: 1.07 mg/dL (ref 0.4–1.5)
Creatinine, Ser: 1.11 mg/dL (ref 0.4–1.5)
GFR calc Af Amer: >60 mL/min (ref >60)
GFR calc Af Amer: >60 mL/min (ref >60)
GFR calc non Af Amer: >60 mL/min (ref >60)
GFR calc non Af Amer: >60 mL/min (ref >60)
Glucose, Bld: 124 mg/dL — ABNORMAL HIGH (ref 70–99)
Potassium: 3.5 mEq/L (ref 3.5–5.1)
Potassium: 3.6 mEq/L (ref 3.5–5.1)
Potassium: 3.7 mEq/L (ref 3.5–5.1)
Potassium: 4 mEq/L (ref 3.5–5.1)
Sodium: 140 mEq/L (ref 135–145)

## 2011-01-22 LAB — CBC
HCT: 34 % — ABNORMAL LOW (ref 39.0–52.0)
HCT: 36.2 % — ABNORMAL LOW (ref 39.0–52.0)
HCT: 36.7 % — ABNORMAL LOW (ref 39.0–52.0)
HCT: 38.1 % — ABNORMAL LOW (ref 39.0–52.0)
Hemoglobin: 11.8 g/dL — ABNORMAL LOW (ref 13.0–17.0)
MCH: 29.3 pg (ref 26.0–34.0)
MCH: 31.3 pg (ref 26.0–34.0)
MCHC: 32.2 g/dL (ref 30.0–36.0)
MCHC: 32.6 g/dL (ref 30.0–36.0)
MCHC: 32.8 g/dL (ref 30.0–36.0)
MCV: 93.7 fL (ref 78.0–100.0)
MCV: 95.3 fL (ref 78.0–100.0)
Platelets: 153 10*3/uL (ref 150–400)
Platelets: 180 10*3/uL (ref 150–400)
Platelets: 292 10*3/uL (ref 150–400)
RBC: 4.01 MIL/uL — ABNORMAL LOW (ref 4.22–5.81)
RDW: 13.2 % (ref 11.5–15.5)
RDW: 13.2 % (ref 11.5–15.5)
RDW: 13.6 % (ref 11.5–15.5)
RDW: 13.6 % (ref 11.5–15.5)
WBC: 6.9 10*3/uL (ref 4.0–10.5)
WBC: 7.8 10*3/uL (ref 4.0–10.5)
WBC: 9.6 10*3/uL (ref 4.0–10.5)

## 2011-01-22 LAB — DIFFERENTIAL
Basophils Absolute: 0 10*3/uL (ref 0.0–0.1)
Basophils Absolute: 0.1 10*3/uL (ref 0.0–0.1)
Basophils Relative: 1 % (ref 0–1)
Lymphocytes Relative: 23 % (ref 12–46)
Lymphs Abs: 1.6 10*3/uL (ref 0.7–4.0)
Monocytes Absolute: 0.7 10*3/uL (ref 0.1–1.0)
Monocytes Absolute: 0.9 10*3/uL (ref 0.1–1.0)
Neutro Abs: 4.3 10*3/uL (ref 1.7–7.7)
Neutro Abs: 5 10*3/uL (ref 1.7–7.7)
Neutrophils Relative %: 63 % (ref 43–77)

## 2011-01-22 LAB — POCT I-STAT, CHEM 8
Calcium, Ion: 1.11 mmol/L — ABNORMAL LOW (ref 1.12–1.32)
Glucose, Bld: 126 mg/dL — ABNORMAL HIGH (ref 70–99)
HCT: 37 % — ABNORMAL LOW (ref 39.0–52.0)
Hemoglobin: 12.6 g/dL — ABNORMAL LOW (ref 13.0–17.0)
TCO2: 28 mmol/L (ref 0–100)

## 2011-01-23 LAB — ABO/RH: ABO/RH(D): O NEG

## 2011-01-23 LAB — DIFFERENTIAL
Basophils Relative: 1 % (ref 0–1)
Eosinophils Absolute: 0.4 10*3/uL (ref 0.0–0.7)
Monocytes Relative: 8 % (ref 3–12)
Neutrophils Relative %: 52 % (ref 43–77)

## 2011-01-23 LAB — PROTIME-INR
INR: 0.97 (ref 0.00–1.49)
Prothrombin Time: 13.1 seconds (ref 11.6–15.2)

## 2011-01-23 LAB — URINALYSIS, ROUTINE W REFLEX MICROSCOPIC
Bilirubin Urine: NEGATIVE
Glucose, UA: NEGATIVE mg/dL
Hgb urine dipstick: NEGATIVE
Specific Gravity, Urine: 1.027 (ref 1.005–1.030)
Urobilinogen, UA: 0.2 mg/dL (ref 0.0–1.0)

## 2011-01-23 LAB — SURGICAL PCR SCREEN
MRSA, PCR: NEGATIVE
Staphylococcus aureus: NEGATIVE

## 2011-01-23 LAB — BASIC METABOLIC PANEL
CO2: 28 mEq/L (ref 19–32)
Calcium: 9.4 mg/dL (ref 8.4–10.5)
Creatinine, Ser: 1.03 mg/dL (ref 0.4–1.5)
Glucose, Bld: 76 mg/dL (ref 70–99)

## 2011-01-23 LAB — CBC
MCH: 31.4 pg (ref 26.0–34.0)
MCHC: 34.4 g/dL (ref 30.0–36.0)
Platelets: 222 10*3/uL (ref 150–400)

## 2011-01-23 LAB — TYPE AND SCREEN: Antibody Screen: NEGATIVE

## 2011-02-18 LAB — CBC
HCT: 43.9 % (ref 39.0–52.0)
Platelets: 203 10*3/uL (ref 150–400)
RDW: 13.1 % (ref 11.5–15.5)
WBC: 6.3 10*3/uL (ref 4.0–10.5)

## 2011-02-18 LAB — BASIC METABOLIC PANEL
BUN: 18 mg/dL (ref 6–23)
Calcium: 9.3 mg/dL (ref 8.4–10.5)
Creatinine, Ser: 0.99 mg/dL (ref 0.4–1.5)
GFR calc non Af Amer: >60 mL/min (ref >60)
Potassium: 4.5 mEq/L (ref 3.5–5.1)

## 2011-03-26 NOTE — Assessment & Plan Note (Signed)
[facility]                       [place] OFFICE NOTE   MEGAN, HAYDUK                    MRN:          [id number]  DATE:01/18/2009                            DOB:          07-Apr-1951    PRIMARY CARE PHYSICIAN:  Madelin Rear. Sherwood Gambler, MD   REASON FOR VISIT:  Routine followup.   HISTORY OF PRESENT ILLNESS:  Mr. Siegert presents for a 1-year visit.  This is my first meeting with him in the office.  He has a history of  coronary artery disease status post previous anterior wall myocardial  infarction while visiting in [facility] and status post stent  placement to the ramus intermedius in 2005.  He denies any significant  angina and had a reassuring followup Myoview in February 2009, showing  no ischemia with an ejection fraction of 55%.  He states that he has  been following with a nutritionist and try to lose weight.  He remains  morbidly obese, although has lost approximately 15 pounds over the last  several weeks.  He has also been trying to do regular exercise regimen.  The patient's electrocardiogram shows sinus rhythm with a prolonged  period interval of 212 msec and nonspecific ST-T wave changes.  No  marked changes noted in comparison to his previous tracing in the chart.  He has not had a fasting lipid profile done recently.  He reports  tolerating his medications well.  Blood pressure is well controlled in  general.   ALLERGIES:  No known drug allergies.   MEDICATIONS:  1. Plavix 75 mg p.o. daily.  2. Aspirin 325 mg p.o. daily.  3. Atenolol 50 mg p.o. daily.  4. Hydrochlorothiazide 12.5 mg p.o. daily.  5. Vytorin 10/80 mg nightly.  6. Altace 10 mg p.o. daily.  7. Celebrex 200 mg p.r.n.  8. CoQ10 daily.  9. Omega-3 supplements 4 g daily.  10.Sublingual nitroglycerin 0.4 mg p.r.n.   REVIEW OF SYSTEMS:  Outlined above.  He states he is potentially in line  for several different procedures coming up including a  tooth extraction,  arthroscopic left ankle surgery, and knee surgery.  Otherwise negative.   PHYSICAL EXAMINATION:  VITAL SIGNS:  Blood pressure 126/78, heart rate  is 59, weight 321 pounds down from 326 in February 2009.  GENERAL:  This is a morbidly obese male in no acute distress.  HEENT:  Conjunctivae are normal.  Oropharynx is clear.  NECK:  Supple.  No elevated jugular venous pressure.  No loud bruits.  No thyromegaly.  LUNGS:  Clear without labored breathing at rest.  CARDIAC:  Regular rate and rhythm.  No pericardial rub or S3 gallop.  ABDOMEN:  Soft, obese, unable to palpate liver edge.  EXTREMITIES:  No frank pitting edema.  Distal pulses are 2+.  SKIN:  Warm and dry.  MUSCULOSKELETAL:  No kyphosis noted.  NEUROPSYCHIATRIC:  The patient is alert and oriented x3.  Affect is  appropriate.   IMPRESSION AND RECOMMENDATIONS:  1. Coronary artery disease, status post previous anterior wall      myocardial infarction, status post stent placement to the ramus  intermedius in [place] back in 2005.  Followup adenosine      Myoview just last year demonstrated no frank ischemia with a normal      ejection fraction.  At this point, Mr. Morones is symptomatically      stable without active angina or heart failure symptoms.  We will      plan to continue his present medical regimen and maintain an annual      followup.  Regarding his potential upcoming surgeries, I think it      would be reasonable for him to hold aspirin and Plavix temporarily      in the week before these operations as required by his surgeons.      These medications can be reinitiated following surgery when felt to      be safe from a healing perspective.  I do not anticipate that he      will need to follow up with any ischemic evaluation before      proceeding with surgery given his reassuring study within the last      year and his stable symptomatology.  2. Hyperlipidemia, we will plan followup lipid  profile and liver      function tests.  He remains on Vytorin.  3. Morbid obesity, presently following with a nutritionist with a goal      of weight loss and increase exercise.     Jonelle Sidle, MD  Electronically Signed    SGM/MedQ  DD: 01/18/2009  DT: 01/19/2009  Job #: [id number]   cc:   Madelin Rear. Sherwood Gambler, MD

## 2011-03-26 NOTE — Op Note (Signed)
NAME:  Evan Warner, Evan Warner             ACCOUNT NO.:  [id number]   MEDICAL RECORD NO.:  [id number]          PATIENT TYPE:  AMB   LOCATION:  SDS                          FACILITY:  [facility]   PHYSICIAN:  Mark C. Ophelia Charter, M.D.    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   POSTOPERATIVE DIAGNOSIS:  Left ankle localized synovitis.   PROCEDURE:  Left ankle arthroscopy and partial synovectomy.   SURGEON:  Mark C. Ophelia Charter, MD   ANESTHESIA:  General plus Marcaine local.   PROCEDURE:  After induction of anesthesia with well-cladded portals.  After standard prepping and draping, a surgical time-out procedure, calf  and leg, holder, impervious stockinette, Coban over the forefoot and the  stirrup double strap, ankle traction device, hooked to Kerlix around the  surgeon's waist.  A time-out was completed and arthroscopic portals were  infiltrated.  Anterolateral and anteromedial portals were used.  Scope  was placed initially from the anteromedial portal.  The joint was  insufflated at 70.  The ankle was entered by longitudinal split in the  skin just through the dermis.  Blunt spreading with the mosquito and  then poking through into the joint with the mosquito and spreading.  Gray white 4.5 shaver was introduced from the anterolateral portal and  anterior synovectomy was performed keeping the shaver pointed toward the  ankle joint with distraction.  There was a small anterior osteophyte  spur on the tibia, not well localized on plain radiographs.  This was  trimmed back with the shaver.  There was some inflamed synovium  anterolaterally and this was debrided.  MRI scan showed evidence of an  anterolateral loose body and this area of localized synovium was  debrided back.  Fibula was visualized.  The anterior talofibular  ligament was visualized and care was taken to make sure that this was  not debrided.  Switching back and forth between portals, the following  down into the lateral gutter using a probe in between the tibia and  fibula the way to the part for visualization, there were no loose bodies  in the gutter.  Anterolaterally, there was a synovitis and no loose body  was visualized.  Distally, the scope was followed from the anterolateral  portal, then into the gutter, into the fibular recess.  Synovium covered  the accessory ossicle, but no loose body was present in the joint.  Probing was performed switching portals again.  Inspection in the medial  gutter was performed as well.  Between the medial malleolus and talar  dome, no loose pieces were found.  Joint was flexed, extended, and  distracted, carefully inspected.  After localized synovitis had been  trimmed and debrided, anterior talofibular ligament looked normal and  was probed from the anterolateral portal with direct visualization  appeared to be tight and snug.  There was negative anterior drawer test.  The patient may have had some anterolateral impingement from a previous  injury,  but in any event there was no loose body present to correlate  with findings on the MRI visualized during arthroscopy.  There was some  bleeding that occurred from the anterolateral portal.  Cautery was used  and  hemostat was used to grasp and the tiny branch artery was coagulated.  Medial portals were closed with single suture.  Two nylon sutures were  placed in the lateral portal with dry area and then infiltration of the  joint with 10 mL Marcaine, 4 x 4s and Ace wrap were applied for  postoperative dressing.  Office followup in 1 week.      Mark C. Ophelia Charter, M.D.  Electronically Signed     MCY/MEDQ  D:  04/28/2009  T:  04/29/2009  Job:  [id number]

## 2011-03-26 NOTE — Assessment & Plan Note (Signed)
[facility]                       [place] CARDIOLOGY OFFICE NOTE   Evan Warner, Evan Warner                    MRN:          [id number]  DATE:12/16/2007                            DOB:          May 29, 1951    Seen today in follow-up.  He really came because the need refills on his  prescriptions.  He has not been seen since I believe 2007.   He previously been seen by Dr. Nobie Putnam.  He really has not been  followed by primary care doctor.  His last one was probably Dr. Sherwood Gambler.  The patient has a history coronary artery disease with a previous  anterior wall MI and a stent to the intermediate branch at [facility]  in 2005.  His EF is in the normal range.  He has poor risk factor  modification.  He has been on chronic aspirin and Plavix.   He did stop smoking back in 2005.  I do not have recent labs on him, and  indeed, he really has not had lab work in over 2 years.  I explained to  him the need for general medical care, including checking his a  hemoglobin A1c, PSA and thyroid.   The patient has not any significant chest pain.  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  Has not had palpitations  or syncope.   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  He  will likely need knee replacements in the near future he takes Celebrex  and has for years without any untoward cardiac events.   I did talk to him about the risk, but he seems to think Celebrex works  better than anything else and only takes 200 mg a day.   He understands the risks.   The the patient otherwise has been compliant with his meds.   His current medication include an aspirin a day, Plavix 75 a day,  atenolol, hydrochlorothiazide Vytorin 10/80, Altace 10 a day, Celebrex  200 a day.   EXAM:  Is remarkable for an overweight white male in no distress.  Weight is 326 blood pressure is 110/80, pulse 60 and regular, afebrile.  HEENT:   Unremarkable.  Carotids are without bruit.  No lymphadenopathy, thyromegaly, JVP  elevation.  LUNGS:  Clear diaphragmatic motion.  No wheezing, S1-S2 normal heart  sounds.  PMI normal.  Abdomen is benign bowel sounds positive.  No tenderness.  His pulse intact.  No edema are nonfocal.  SKIN:  Warm and dry.  No muscular weakness.   IMPRESSION:  1. Coronary disease, previous stent to the IM continue aspirin, Plavix      and beta-blocker follow-up adenosine Myoview he will likely need      this anyway clear for any future knee surgery.  2. Hypertension currently well-controlled.  Continue current dose of      medications including all taste low-salt diet.  3. 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  4. Osteoarthritis will likely need knee replacements with the next 2  years.  He is encouraged to follow up with orthopedics for this.  5. General medical care, he needs to Dr. Sherwood Gambler or a primary care      doctor of his choice.  He needs LFTs since he had been on Vytorin      and he should have a PSA and digital exam as well as a hemoglobin      A1c to his risk of type 2 diabetes.   Explained this to Evan Warner we did refill his prescriptions and  hopefully he will follow through with his general medical care.  I will  see him in a year, so long as his Myoview was low-risk.     Noralyn Pick. Eden Emms, MD, [facility]  Electronically Signed    PCN/MedQ  DD: 12/16/2007  DT: 12/17/2007  Job #: [id number]

## 2011-03-29 NOTE — Letter (Signed)
June 09, 2006     Adrian Prince, DDS  [address]  [place], [place] [address]   RE:  JUANMIGUEL, DEFELICE  MRN:  [id number]  /  DOB:  [birth year]   Dear Dr. Lamont Dowdy:   I understand that you are recommending to Mr. Babers that he have his  wisdom teeth removed.  I wanted to point-out to you that Mr. Grine has  significant coronary disease.  He was cared for in [place], [place] where he had an acute myocardial infarction back in 2005 and has a  drug-eluting stent in his ramus intermedius artery.  Fortunately his LV  systolic function is normal.  He has since stopped smoking and he is under  my care for hypertension, hyperlipidemia and ongoing cardiovascular care.  He has chosen and given his multiple comorbidities,  he continues to take  Plavix and aspirin due to the fact that he has a drug-eluting stent and back  in January when he came to see me we had a detailed conversation about that.  As you may be aware, Plavix and aspirin are an important adjuvant medication  in patients who have a drug-eluting stent in coronary artery disease and the  discontinuation of Plavix, especially if it is done prematurely, can lead to  late stent thrombosis.  Mr. Schuyler is greater than two years out from his  placement of his stent.  It is very likely that he would be able to tolerate  stopping the Plavix, however when I talked to him about that and lay out the  likely potential for a late stent thrombosis he is very uncomfortable and  would prefer to continue the Plavix.  As such, one of the things he is  interested in is knowing whether or not he could have his wisdom teeth  removed without having to stop either aspirin or Plavix and I think that  that is a very reasonable question to ask, knowing uncertain that there is  anybody who can potentially do that.  So he asked if I would contact you  regarding this so that you folks could have a detailed conversation  regarding it.  I think his risk of late stent thrombosis given these two  years out from a drug-eluting stent is probably pretty small and probably  measured in less than 5% risk, but I do not think anybody knows the data  entirely.  There has been some data from a data base at [facility] which has shown that patients who continue on aspirin and  Plavix indefinitely may have less of a risk of late stent thrombosis.   PHYSICAL EXAMINATION:  VITAL SIGNS:  On physical exam today Mr. Kozak is  311 pounds.  His blood pressure is 130/80 and his pulse is 68.  CHEST:  His chest is clear to auscultation.  NECK:  He has no jugular venous distension or carotid bruits.  CARDIOVASCULAR:  His cardiovascular exam was somewhat distant but regular.  EXTREMITIES:  His lower extremities were without significant edema.   MEDICATIONS:  1.  He is on aspirin 325 once a day.  2.  Plavix 75 once a day.  3.  Atenolol 50 once a day.  4.  Hydrochlorothiazide 12.5 once a day.  5.  Vytorin 10/80 once a day.  6.  Altace 10 mg once a day.  7.  He takes nitroglycerin on an as needed basis and has needed that for  quite some time.   I think Mr. Buchmann is doing reasonably well.  I am loathe to stop his  Plavix and aspirin unless there is something specific we can do to move  forward with his cardiovascular care.  I think you and Mr. Sadowski ought  to have a discussion regarding that and if it is possible to refer him to  somebody who can do this procedure on aspirin and Plavix.  If not possible  and the procedure needs to be done then Plavix should be stopped with the  understanding that he does carry some risk which is not modifiable.    Sincerely,      Farris Has. Dorethea Clan, MD   JMH/MedQ  DD:  06/09/2006  DT:  06/10/2006  Job #:  [id number]   CC:    Madelin Rear. Sherwood Gambler, MD

## 2011-03-29 NOTE — Assessment & Plan Note (Signed)
[facility]                       [place] CARDIOLOGY OFFICE NOTE   Evan Warner                    MRN:          [id number]  DATE:12/31/2006                            DOB:          1951-05-28    Evan Warner is seen today as a new patient by me.   He has had previous angioplasty and stenting of the ramus branch in  2005.   He did have a drug-eluting stent and has been on aspirin and Plavix.  There was an issue previously about having his wisdom teeth pulled and  stopping his Plavix. It is clear from my experience in [place] that  an oral surgeon can remove wisdom teeth and not stop aspirin and Plavix.  Apparently, the patient has talked to Dr. Marland Kitchen? [place] and this procedure  has been put on hold. However, the patient has significant bilateral  knee problems. He has had a previous open arthroscopy of his right knee  and probably needs a knee replacement in the upcoming year by Dr. Doristine Section in [place]. There is no option here and his Plavix will have  to be stopped.   The patient is otherwise doing well. He has no significant angina, PND  or orthopnea or syncope.   The patient has had some problems with myalgias on Lipitor. He is  currently tolerating his Vytorin with an LDL of 68.   There have been no other new problems. His other past medical history is  remarkable for:  1. Hypertension.  2. Remote tobacco abuse.  3. Hyperlipidemia.   His last EF by echo in July of 2005, was 55%.   MEDICATIONS:  1. Aspirin a day.  2. Plavix 75 a day.  3. Atenolol 50 a day.  4. Hydrochlorothiazide 12.5 a day.  5. Vytorin 10/80.  6. Altace 10 a day.  7. Celebrex 200 b.i.d.   We had a fairly long discussion about Celebrex. He had marked  improvement in his knee pain with the Celebrex. However, he was not  tried on any other anti-inflammatories such as Naprosyn. Apparently, Dr.  Oswaldo Done felt that the risk of ulcer was higher. However,  the patient has  never had an ulcer problem and is not on a proton pump inhibitor. I told  Evan Warner to talk to his orthopedic doctor regarding other anti-  inflammatories. Similarly, we had a long discussion regarding the Zetia  component of his Vytorin. The patient has had problems with other statin  drugs. He did not reach target with a statin alone and he is in the 60  range with the addition of Zetia and I told him that I prefer to keep  him on this drug for the time being.   PHYSICAL EXAMINATION:  He is overweight, but he has lost 12 pounds.  Blood pressure is 138/74, pulse 60 and regular.  HEENT: Is normal. Carotids are normal without bruits. There is no JVP  elevation. There is no thyromegaly or lymphadenopathy.  LUNGS:  Are clear.  He has an S1, S2 with normal heart sounds.  ABDOMEN: Is benign.  LOWER EXTREMITIES: Distal intact pulses. No  edema. He is status post  right open knee arthroscopy.   IMPRESSION:  Stable coronary artery disease, previous stenting of the  ramus branch without recurrent chest pain. Continue aspirin and Plavix  only to stop Plavix for open knee surgery, not for dental surgery.   The patient will continue his beta-blocker therapy. He is cleared for  surgery at this time so long as it is done within the next year and he  remains asymptomatic. We would like to follow him in [facility] peri-  operatively and get him back on his Plavix as soon as possible.   He will continue his Vytorin for lipid control and we will check his  LFTs in six months. His blood pressure is under good control with  Altace.   He will talk to Dr. Renae Fickle regarding alternatives to Celebrex such as  Naprosyn.   The patient will continue to try to lose weight. We went over the [facility] Diet today and he seems to like this better than a fasting type  diet. I will see him back in six months unless he has his knee surgery  earlier and I will see him in the hospital.      Theron Arista C. Eden Emms, MD, [facility]  Electronically Signed    PCN/MedQ  DD: 12/31/2006  DT: 12/31/2006  Job #: [id number]

## 2011-04-02 ENCOUNTER — Telehealth: Payer: Self-pay | Admitting: Cardiology

## 2011-04-02 MED ORDER — ATENOLOL 50 MG PO TABS: 50.0000 mg | ORAL_TABLET | Freq: Every day | ORAL | Status: DC

## 2011-04-02 MED ORDER — HYDROCHLOROTHIAZIDE 12.5 MG PO CAPS: 12.5000 mg | ORAL_CAPSULE | Freq: Every day | ORAL | Status: DC

## 2011-04-02 NOTE — Telephone Encounter (Signed)
PT WIFE WALKED IN STATING THAT [facility] HAD TRIED SEVERAL TIMES TO GET MEDS FILLED AND WE HAVE NOT RESPONDED. HE NEEDS  ATENOLOL 50MG  #90 AND HYDROCHLOROTHIAZIDE 12.5MG  #90 BOTH [facility]ED [facility] [place].

## 2011-11-06 ENCOUNTER — Encounter: Payer: Self-pay | Admitting: Cardiology

## 2011-12-12 ENCOUNTER — Encounter: Payer: Self-pay | Admitting: Cardiology

## 2011-12-30 ENCOUNTER — Other Ambulatory Visit: Payer: Self-pay | Admitting: Cardiology

## 2011-12-31 ENCOUNTER — Other Ambulatory Visit: Payer: Self-pay | Admitting: *Deleted

## 2011-12-31 MED ORDER — ATENOLOL 50 MG PO TABS: 50.0000 mg | ORAL_TABLET | Freq: Every day | ORAL | Status: DC

## 2011-12-31 MED ORDER — HYDROCHLOROTHIAZIDE 12.5 MG PO CAPS: 12.5000 mg | ORAL_CAPSULE | Freq: Every day | ORAL | Status: DC

## 2012-01-23 ENCOUNTER — Encounter: Payer: Self-pay | Admitting: Cardiology

## 2012-01-23 ENCOUNTER — Ambulatory Visit (INDEPENDENT_AMBULATORY_CARE_PROVIDER_SITE_OTHER): Payer: BC Managed Care – PPO | Admitting: Cardiology

## 2012-01-23 VITALS — BP 150/81 | HR 61 | Resp 18 | Ht 74.0 in | Wt 364.0 lb

## 2012-01-23 DIAGNOSIS — I10 Essential (primary) hypertension: Secondary | ICD-10-CM

## 2012-01-23 DIAGNOSIS — I2510 Atherosclerotic heart disease of native coronary artery without angina pectoris: Secondary | ICD-10-CM

## 2012-01-23 DIAGNOSIS — G4733 Obstructive sleep apnea (adult) (pediatric): Secondary | ICD-10-CM

## 2012-01-23 DIAGNOSIS — E782 Mixed hyperlipidemia: Secondary | ICD-10-CM

## 2012-01-23 NOTE — Assessment & Plan Note (Signed)
Continue medical therapy. Work on weight loss.

## 2012-01-23 NOTE — Patient Instructions (Signed)
**Note De-Identified Evan Warner Obfuscation** Your physician recommends that you return for lab work in: 1 year, just before next office visit  Your physician recommends that you continue on your current medications as directed. Please refer to the Current Medication list given to you today.  Your physician recommends that you schedule a follow-up appointment in: 1 year

## 2012-01-23 NOTE — Assessment & Plan Note (Signed)
Symptomatically stable on medical therapy. ECG reviewed. No changes to regimen. We discussed diet and exercise. Annual followup.

## 2012-01-23 NOTE — Assessment & Plan Note (Signed)
Now on CPAP. 

## 2012-01-23 NOTE — Assessment & Plan Note (Signed)
Recent LDL looked good. Recheck FLP and LFT for next visit.

## 2012-01-23 NOTE — Progress Notes (Signed)
   Clinical Summary Evan Warner is a 61 y.o.male presenting for followup. He was seen in February 2012.    Lab work from January 2013 showed total cholesterol 129, HDL 45, triglycerides 111, LDL 62, AST 16, ALT 16. We reviewed these today.  Lexiscan Myoview from last February showed soft tissue attenuation without definite ischemia, LVEF 55%. No denies any ahgina since last visit.  He is now on CPAP with diagnosis OSA. We discussed his diet and also plan for exercise. He is limited by knee pain.   No Known Allergies  Current Outpatient Prescriptions  Medication Sig Dispense Refill  . aspirin 325 MG EC tablet Take 325 mg by mouth daily.        Marland Kitchen atenolol (TENORMIN) 50 MG tablet Take 1 tablet (50 mg total) by mouth daily.  30 tablet  0  . celecoxib (CELEBREX) 200 MG capsule Take 200 mg by mouth daily.        . clopidogrel (PLAVIX) 75 MG tablet Take 75 mg by mouth daily.        . Coenzyme Q-10 100 MG capsule Take 100 mg by mouth daily.        . fish oil-omega-3 fatty acids 1000 MG capsule Take 1 capsule by mouth daily.        . hydrochlorothiazide (MICROZIDE) 12.5 MG capsule Take 1 capsule (12.5 mg total) by mouth daily.  90 capsule  2  . Multiple Vitamins-Minerals (MULTI COMPLETE PO) Take by mouth daily.      . nitroGLYCERIN (NITROSTAT) 0.4 MG SL tablet Place 0.4 mg under the tongue as needed.        . NON FORMULARY c-papa  And O2      . Potassium 95 MG TABS Take by mouth.      . ramipril (ALTACE) 10 MG capsule Take 10 mg by mouth daily.        . Tamsulosin HCl (FLOMAX) 0.4 MG CAPS Take 0.4 mg by mouth at bedtime.        Marland Kitchen VYTORIN 10-80 MG per tablet TAKE 1 TABLET BY MOUTH EVERY DAY  30 tablet  0    Past Medical History  Diagnosis Date  . Coronary atherosclerosis of native coronary artery     DES ramus 2005, LVEF 55%  . Hyperlipidemia   . Essential hypertension, benign   . MI (myocardial infarction)     [facility]  . Morbid obesity   . Arthritis   . OSA (obstructive  sleep apnea)     On CPAP    Social History Mr. Maffei reports that he has quit smoking. His smoking use included Cigarettes. He has never used smokeless tobacco. Mr. Haidar reports that he does not drink alcohol.  Review of Systems No palpitations. More energy since CPAP. No bleeding problems. Otherwise negative.  Physical Examination Filed Vitals:   01/23/12 1319  BP: 150/81  Pulse: 61  Resp: 18   Morbidly obese male in no acute distress.  HEENT: Conjunctiva and lids normal, oropharynx with moist mucosa.  Neck: Supple, no carotid bruits, no thyromegaly.  Lungs: Clear to auscultation, nonlabored.  Cardiac: Indistinct PMI, regular rate and rhythm, no S3 or rub.  Abdomen: Obese, nontender, bowel sounds present.  Skin: Warm and dry.  Extremities: No pitting edema, distal pulses one plus.  Musculoskeletal: No kyphosis.  Neuropsychiatric: Alert and oriented x3, affect appropriate.   ECG Sinus rhythm with PR 224 ms, NSST changes.   Problem List and Plan

## 2012-01-30 ENCOUNTER — Other Ambulatory Visit: Payer: Self-pay | Admitting: Cardiology

## 2012-02-03 ENCOUNTER — Ambulatory Visit: Payer: BC Managed Care – PPO | Admitting: Cardiology

## 2012-02-07 ENCOUNTER — Other Ambulatory Visit: Payer: Self-pay | Admitting: Cardiology

## 2012-02-25 ENCOUNTER — Other Ambulatory Visit: Payer: Self-pay | Admitting: Cardiology

## 2012-03-12 ENCOUNTER — Other Ambulatory Visit: Payer: Self-pay | Admitting: Cardiology

## 2016-07-23 ENCOUNTER — Other Ambulatory Visit: Payer: Self-pay | Admitting: Orthopaedic Surgery

## 2016-07-25 ENCOUNTER — Ambulatory Visit (HOSPITAL_COMMUNITY)
Admission: RE | Admit: 2016-07-25 | Discharge: 2016-07-25 | Disposition: A | Discharge status: Home / Self Care | Payer: No Typology Code available for payment source | Source: Ambulatory Visit | Attending: Orthopaedic Surgery | Admitting: Orthopaedic Surgery

## 2016-07-25 ENCOUNTER — Encounter (HOSPITAL_COMMUNITY): Payer: Self-pay

## 2016-07-25 ENCOUNTER — Encounter (HOSPITAL_COMMUNITY)
Admission: RE | Admit: 2016-07-25 | Discharge: 2016-07-25 | Disposition: A | Discharge status: Home / Self Care | Payer: No Typology Code available for payment source | Source: Ambulatory Visit | Attending: Orthopaedic Surgery | Admitting: Orthopaedic Surgery

## 2016-07-25 DIAGNOSIS — I517 Cardiomegaly: Secondary | ICD-10-CM | POA: Diagnosis not present

## 2016-07-25 DIAGNOSIS — M179 Osteoarthritis of knee, unspecified: Secondary | ICD-10-CM

## 2016-07-25 DIAGNOSIS — M1710 Unilateral primary osteoarthritis, unspecified knee: Secondary | ICD-10-CM

## 2016-07-25 HISTORY — DX: Pain in unspecified joint: M25.50

## 2016-07-25 HISTORY — DX: Pneumonia, unspecified organism: J18.9

## 2016-07-25 HISTORY — DX: Personal history of urinary calculi: Z87.442

## 2016-07-25 HISTORY — DX: Effusion, unspecified joint: M25.40

## 2016-07-25 HISTORY — DX: Nocturia: R35.1

## 2016-07-25 HISTORY — DX: Unspecified mood (affective) disorder: F39

## 2016-07-25 HISTORY — DX: Edema, unspecified: R60.9

## 2016-07-25 HISTORY — DX: Gout, unspecified: M10.9

## 2016-07-25 HISTORY — DX: Unspecified atrial fibrillation: I48.91

## 2016-07-25 HISTORY — DX: Gastro-esophageal reflux disease without esophagitis: K21.9

## 2016-07-25 HISTORY — DX: Frequency of micturition: R35.0

## 2016-07-25 HISTORY — DX: Localized edema: R60.0

## 2016-07-25 HISTORY — DX: Benign prostatic hyperplasia without lower urinary tract symptoms: N40.0

## 2016-07-25 LAB — CBC
HEMATOCRIT: 44.7 % (ref 39.0–52.0)
HEMOGLOBIN: 15.3 g/dL (ref 13.0–17.0)
MCH: 31.2 pg (ref 26.0–34.0)
MCHC: 34.2 g/dL (ref 30.0–36.0)
MCV: 91.2 fL (ref 78.0–100.0)
PLATELETS: 238 10*3/uL (ref 150–400)
RBC: 4.9 MIL/uL (ref 4.22–5.81)
RDW: 13.5 % (ref 11.5–15.5)
WBC: 7.5 10*3/uL (ref 4.0–10.5)

## 2016-07-25 LAB — COMPREHENSIVE METABOLIC PANEL
ALK PHOS: 102 U/L (ref 38–126)
ALT: 22 U/L (ref 17–63)
AST: 24 U/L (ref 15–41)
Albumin: 4 g/dL (ref 3.5–5.0)
Anion gap: 8 (ref 5–15)
BUN: 14 mg/dL (ref 6–20)
CALCIUM: 9.2 mg/dL (ref 8.9–10.3)
CHLORIDE: 107 mmol/L (ref 101–111)
CO2: 25 mmol/L (ref 22–32)
CREATININE: 1.07 mg/dL (ref 0.61–1.24)
Glucose, Bld: 97 mg/dL (ref 65–99)
Potassium: 3.5 mmol/L (ref 3.5–5.1)
Sodium: 140 mmol/L (ref 135–145)
Total Bilirubin: 1 mg/dL (ref 0.3–1.2)
Total Protein: 7 g/dL (ref 6.5–8.1)

## 2016-07-25 LAB — URINALYSIS, ROUTINE W REFLEX MICROSCOPIC
BILIRUBIN URINE: NEGATIVE
Glucose, UA: NEGATIVE mg/dL
KETONES UR: NEGATIVE mg/dL
LEUKOCYTES UA: NEGATIVE
NITRITE: NEGATIVE
PROTEIN: NEGATIVE mg/dL
Specific Gravity, Urine: 1.018 (ref 1.005–1.030)
pH: 6.5 (ref 5.0–8.0)

## 2016-07-25 LAB — APTT: aPTT: 49 seconds — ABNORMAL HIGH (ref 24–36)

## 2016-07-25 LAB — URINE MICROSCOPIC-ADD ON

## 2016-07-25 LAB — PROTIME-INR
INR: 1.16
PROTHROMBIN TIME: 14.8 s (ref 11.4–15.2)

## 2016-07-25 LAB — SURGICAL PCR SCREEN
MRSA, PCR: NEGATIVE
Staphylococcus aureus: NEGATIVE

## 2016-07-25 IMAGING — CR DG CHEST 2V
2 series · 2 of 2 positions shown · non-contrast
Comparison: CTA chest [DATE] and earlier.

CLINICAL DATA: 65-year-old male preoperative study for left knee
surgery. Initial encounter. Former smoker.

EXAM:
CHEST  2 VIEW

[w chest pa]
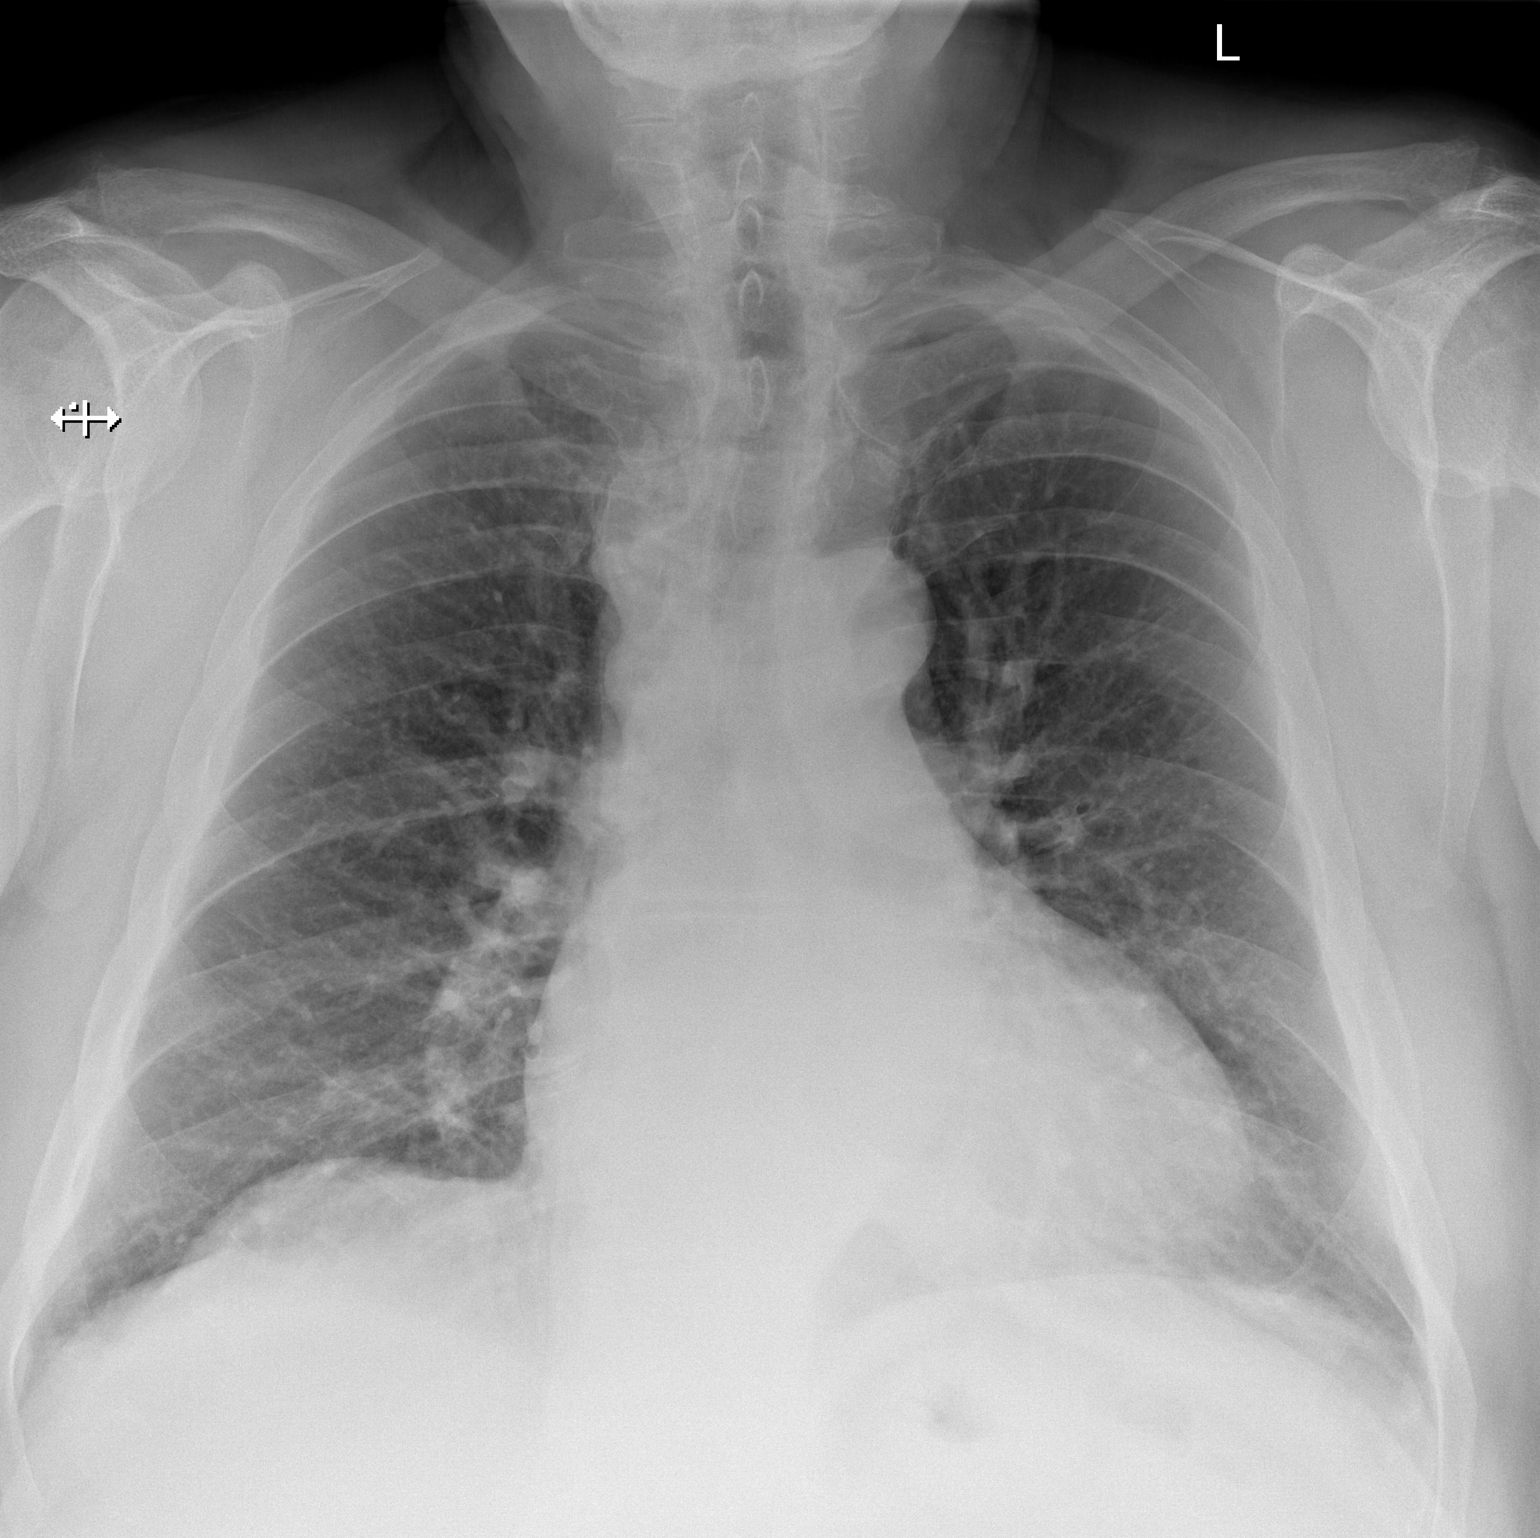

[w chest lat]
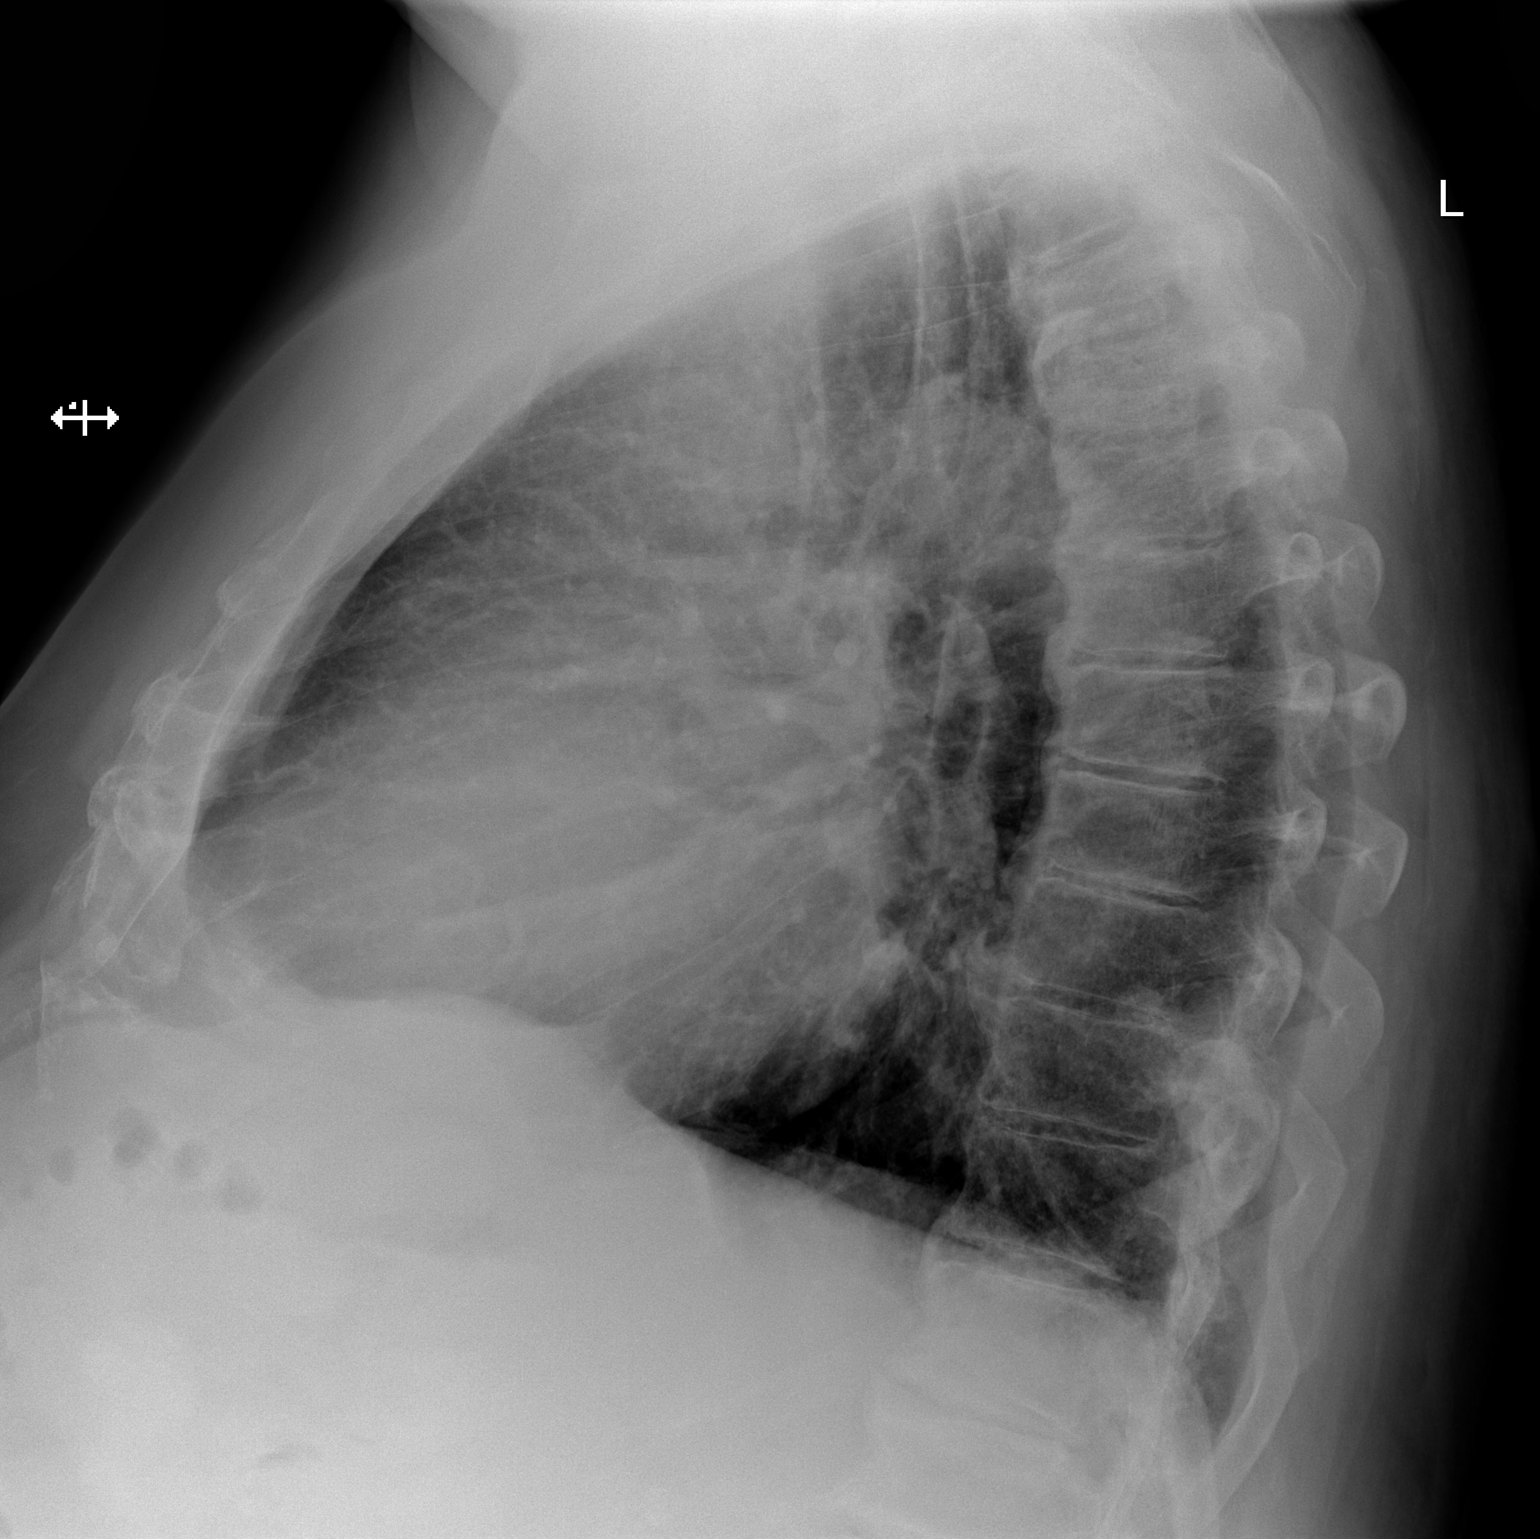

[2 of 2 positions shown; findings below may reference images not displayed]

FINDINGS: Mild to moderate cardiomegaly appears increased since [XY]. Other
mediastinal contours are within normal limits. Visualized tracheal
air column is within normal limits. Lung volumes remain stable and
within normal limits. Mild eventration of the right hemidiaphragm.
No pneumothorax, pulmonary edema, pleural effusion or confluent
pulmonary opacity. No acute osseous abnormality identified.
IMPRESSION: Cardiomegaly has increased since [XY]. No acute cardiopulmonary
abnormality.

## 2016-07-25 MED ORDER — CHLORHEXIDINE GLUCONATE 4 % EX LIQD: 60.0000 mL | Freq: Once | CUTANEOUS | Status: DC

## 2016-07-25 NOTE — Pre-Procedure Instructions (Signed)
Evan LabellaRoger D Warner  07/25/2016      [id number] [place], [place] - [address] AT [facility] [address]  [place] [id number] Phone: [phone] Fax: [phone]    Your procedure is scheduled on Wed, Sept 20 @ 12:30 PM  [facility] at 10:30 AM  Call this number if you have problems the morning of surgery:  [phone]   Remember:  Do not eat food or drink liquids after midnight.  Take these medicines the morning of surgery with A SIP OF WATER Atenolol(Tenormin) and Flomax(Tamsulosin)            Stop taking your CO Q10, Fish Oil, Multivitamins, along with any Herbal Medications a week prior to surgery. No Goody's,BC's,Aleve,Advil,Motrin,or Ibuprofen.    Do not wear jewelry.  Do not wear lotions, powders, or colognes, or deoderant.  Men may shave face and neck.  Do not bring valuables to the hospital.  [facility] is not responsible for any belongings or valuables.  Contacts, dentures or bridgework may not be worn into surgery.  Leave your suitcase in the car.  After surgery it may be brought to your room.  For patients admitted to the hospital, discharge time will be determined by your treatment team.  Patients discharged the day of surgery will not be allowed to drive home.    [facility]  Before surgery, you can play an important role.  Because skin is not sterile, your skin needs to be as free of germs as possible.  You can reduce the number of germs on you skin by washing with CHG (chlorahexidine gluconate) soap before surgery.  CHG is an antiseptic cleaner which kills germs and bonds with the skin to continue killing germs even after washing.  Please DO NOT use if you have an allergy to CHG or antibacterial soaps.  If your skin becomes reddened/irritated stop using the CHG and inform your nurse when you arrive at Short Stay.  Do not shave (including legs and underarms) for  at least 48 hours prior to the first CHG shower.  You may shave your face.  Please follow these instructions carefully:   1.  Shower with CHG Soap the night before surgery and the                                morning of Surgery.  2.  If you choose to wash your hair, wash your hair first as usual with your       normal shampoo.  3.  After you shampoo, rinse your hair and body thoroughly to remove the                      Shampoo.  4.  Use CHG as you would any other liquid soap.  You can apply chg directly       to the skin and wash gently with scrungie or a clean washcloth.  5.  Apply the CHG Soap to your body ONLY FROM THE NECK DOWN.        Do not use on open wounds or open sores.  Avoid contact with your eyes,       ears, mouth and genitals (private parts).  Wash genitals (private parts)       with your normal soap.  6.  Wash thoroughly, paying special attention to  the area where your surgery        will be performed.  7.  Thoroughly rinse your body with warm water from the neck down.  8.  DO NOT shower/wash with your normal soap after using and rinsing off       the CHG Soap.  9.  Pat yourself dry with a clean towel.            10.  Wear clean pajamas.            11.  Place clean sheets on your bed the night of your first shower and do not        sleep with pets.  Day of Surgery  Do not apply any lotions/deoderants the morning of surgery.  Please wear clean clothes to the hospital/surgery center.    Please read over the following fact sheets that you were given. Pain Booklet, Coughing and Deep Breathing, MRSA Information and Surgical Site Infection Prevention

## 2016-07-25 NOTE — Progress Notes (Signed)
Pt states he was told to hold his Pradaxa 1 day prior to surgery.

## 2016-07-25 NOTE — Progress Notes (Addendum)
[facility] [place].To request last visit  Medical Md [facility]acon [facility] [place]  [facility] [place]  Stress test reports in epic from 2009/2011  Heart cath 2005  [place] [place]  CXR denies in past yr

## 2016-07-26 ENCOUNTER — Encounter (HOSPITAL_COMMUNITY): Payer: Self-pay

## 2016-07-26 NOTE — Progress Notes (Signed)
Anesthesia chart review: Patient is a 65 year old male scheduled for left TKA on 07/31/16 by Dr. Ophelia CharterYates. Case is posted for general anesthesia.  History includes former smoker, CAD/MI s/p DES Ramus '05 [facility] chronic afib, HLD, OSA on CPAP/O2, BPH, mood disorder, GERD, gout, peripheral edema, BPH, arthritis, back surgery, right TKA '11 (FNB, MAC4, 7.5 ETT), morbid obesity.   PCP is Dr. Ericka PontiffSeacon with [facility].  Cardiologist is Dr. Clovis PuVan Horn with [facility]. (Previously saw Dr. Diona BrownerMcDowell, last 2013). Cardiology clearance note ("low to intermediate risk") received from Dr. Henry RusselJames Smith (fellow) with [facility] Saraj[facility]Holly [facility] Dr. Katrinka BlazingSmith gave permission to hold Pradaxa 72 hours prior to surgery and resume within one week following. No preoperative cardiology testing recommended.    Meds includes allopurinol, ASA 81mg , atenolol, Pradaxa, fish oil, Prozac, lisinopril-HCTZ, nifedipine, Nitro, ramipril, Protonix, Zocor, Flomax, Vytorin.   BP (!) 146/74   Pulse 61   Temp 36.6 C   Resp 20   Ht 6\' 2"  (1.88 m)   Wt (!) 355 lb 14.4 oz (161.4 kg)   SpO2 95%   BMI 45.69 kg/m    02/20/16 EKG ([facility]): Result Narrative only: Afib, V rate 64 bpm, LAFB, non-specific ST/T wave changes. ACTUAL TRACING REQUESTED, but is still pending.   08/24/10 Nuclear stress test: IMPRESSION: Low risk Lexiscan Myoview as outlined.  No diagnostic ST-segment changes were noted.  There is evidence of soft tissue attenuation affecting the basal inferolateral wall and apical anteroseptal wall.  Normal wall motion is noted in these areas.  No clear evidence of ischemia.  LVEF 55%.  Preoperative labs noted. Cr 1.07. CBC WNL. PTT 49. PT/INR WNL. UA moderate hgb, negative leukocytes and nitrites. Elevated PTT may be due to Pradaxa, will repeat on the day of surgery. (Of note, patient reported being told to only stop Pradaxa a day before surgery, but cardiology notes indicate 72 hours. In  addition, for regional anesthesia to be considered Pradaxa would need to be held 5 days. I called and spoke with Elnita Maxwellheryl at Dr. Ophelia CharterYates office, I asked that he ask Dr. Ophelia CharterYates that if he was willing to consider spinal anesthesia for this case to instruct patient to hold Pradaxa starting today--may even be too late since surgery is already just five days away. Regardless, I asked that she still follow-up with patient to clarify when to hold his Pradaxa since he is under the impression it is only 24 hours when cardiology notes state otherwise.)   EKG, echo (?) requested from the [facility]. I've ask nursing staff to bring me the records once received. If EKG not received then he will need one on arrival (known afib).  Patient does have clearance so, if no acute CV symptoms and afib remains rate controlled then I would anticipate that he could proceed as planned. Definitive plan following anesthesiologist evaluation.  [name], PA-C [facility]esthesiology Phone [phone] 07/26/2016 3:20 PM

## 2016-07-30 MED ORDER — DEXTROSE 5 % IV SOLN
3.0000 g | INTRAVENOUS | Status: AC
  Administered 2016-07-31: 3 g via INTRAVENOUS
  Filled 2016-07-30: qty 3000

## 2016-07-31 ENCOUNTER — Inpatient Hospital Stay (HOSPITAL_COMMUNITY): Payer: No Typology Code available for payment source | Admitting: Vascular Surgery

## 2016-07-31 ENCOUNTER — Inpatient Hospital Stay (HOSPITAL_COMMUNITY)
Admission: RE | Admit: 2016-07-31 | Discharge: 2016-08-04 | DRG: 470 | Disposition: A | Discharge status: Home Health | Payer: No Typology Code available for payment source | Source: Ambulatory Visit | Attending: Orthopaedic Surgery | Admitting: Orthopaedic Surgery

## 2016-07-31 ENCOUNTER — Encounter (HOSPITAL_COMMUNITY)
Admission: RE | Disposition: A | Discharge status: Home Health | Payer: Self-pay | Source: Ambulatory Visit | Attending: Orthopaedic Surgery

## 2016-07-31 ENCOUNTER — Encounter (HOSPITAL_COMMUNITY): Payer: Self-pay | Admitting: Surgery

## 2016-07-31 DIAGNOSIS — M109 Gout, unspecified: Secondary | ICD-10-CM | POA: Diagnosis present

## 2016-07-31 DIAGNOSIS — Z96651 Presence of right artificial knee joint: Secondary | ICD-10-CM | POA: Diagnosis present

## 2016-07-31 DIAGNOSIS — N401 Enlarged prostate with lower urinary tract symptoms: Secondary | ICD-10-CM | POA: Diagnosis present

## 2016-07-31 DIAGNOSIS — I119 Hypertensive heart disease without heart failure: Secondary | ICD-10-CM | POA: Diagnosis present

## 2016-07-31 DIAGNOSIS — K219 Gastro-esophageal reflux disease without esophagitis: Secondary | ICD-10-CM | POA: Diagnosis present

## 2016-07-31 DIAGNOSIS — E782 Mixed hyperlipidemia: Secondary | ICD-10-CM | POA: Diagnosis present

## 2016-07-31 DIAGNOSIS — Z7902 Long term (current) use of antithrombotics/antiplatelets: Secondary | ICD-10-CM | POA: Diagnosis not present

## 2016-07-31 DIAGNOSIS — Z7982 Long term (current) use of aspirin: Secondary | ICD-10-CM | POA: Diagnosis not present

## 2016-07-31 DIAGNOSIS — Z6841 Body Mass Index (BMI) 40.0 and over, adult: Secondary | ICD-10-CM | POA: Diagnosis not present

## 2016-07-31 DIAGNOSIS — I2510 Atherosclerotic heart disease of native coronary artery without angina pectoris: Secondary | ICD-10-CM | POA: Diagnosis present

## 2016-07-31 DIAGNOSIS — Z87891 Personal history of nicotine dependence: Secondary | ICD-10-CM | POA: Diagnosis not present

## 2016-07-31 DIAGNOSIS — G4733 Obstructive sleep apnea (adult) (pediatric): Secondary | ICD-10-CM | POA: Diagnosis present

## 2016-07-31 DIAGNOSIS — Z87442 Personal history of urinary calculi: Secondary | ICD-10-CM

## 2016-07-31 DIAGNOSIS — M1712 Unilateral primary osteoarthritis, left knee: Principal | ICD-10-CM | POA: Diagnosis present

## 2016-07-31 DIAGNOSIS — Z79899 Other long term (current) drug therapy: Secondary | ICD-10-CM | POA: Diagnosis not present

## 2016-07-31 DIAGNOSIS — I482 Chronic atrial fibrillation: Secondary | ICD-10-CM | POA: Diagnosis present

## 2016-07-31 DIAGNOSIS — I252 Old myocardial infarction: Secondary | ICD-10-CM | POA: Diagnosis not present

## 2016-07-31 DIAGNOSIS — F39 Unspecified mood [affective] disorder: Secondary | ICD-10-CM | POA: Diagnosis present

## 2016-07-31 DIAGNOSIS — Z955 Presence of coronary angioplasty implant and graft: Secondary | ICD-10-CM

## 2016-07-31 DIAGNOSIS — I959 Hypotension, unspecified: Secondary | ICD-10-CM | POA: Diagnosis not present

## 2016-07-31 DIAGNOSIS — M25562 Pain in left knee: Secondary | ICD-10-CM | POA: Diagnosis present

## 2016-07-31 HISTORY — PX: TOTAL KNEE ARTHROPLASTY: SHX125

## 2016-07-31 LAB — APTT: APTT: 34 s (ref 24–36)

## 2016-07-31 SURGERY — ARTHROPLASTY, KNEE, TOTAL
Anesthesia: Regional | Site: Knee | Laterality: Left

## 2016-07-31 MED ORDER — ROCURONIUM BROMIDE 10 MG/ML (PF) SYRINGE
PREFILLED_SYRINGE | INTRAVENOUS | Status: AC
  Filled 2016-07-31: qty 10

## 2016-07-31 MED ORDER — 0.9 % SODIUM CHLORIDE (POUR BTL) OPTIME
TOPICAL | Status: DC | PRN
  Administered 2016-07-31: 200 mL

## 2016-07-31 MED ORDER — PHENYLEPHRINE 40 MCG/ML (10ML) SYRINGE FOR IV PUSH (FOR BLOOD PRESSURE SUPPORT)
PREFILLED_SYRINGE | INTRAVENOUS | Status: DC | PRN
  Administered 2016-07-31 (×3): 80 ug via INTRAVENOUS

## 2016-07-31 MED ORDER — SODIUM CHLORIDE 0.9 % IV SOLN
INTRAVENOUS | Status: DC
  Administered 2016-07-31: 19:00:00 via INTRAVENOUS

## 2016-07-31 MED ORDER — ONDANSETRON HCL 4 MG PO TABS: 4.0000 mg | ORAL_TABLET | Freq: Four times a day (QID) | ORAL | Status: DC | PRN

## 2016-07-31 MED ORDER — MIDAZOLAM HCL 5 MG/5ML IJ SOLN
INTRAMUSCULAR | Status: DC | PRN
  Administered 2016-07-31: 2 mg via INTRAVENOUS

## 2016-07-31 MED ORDER — METHOCARBAMOL 1000 MG/10ML IJ SOLN: 500.0000 mg | Freq: Four times a day (QID) | INTRAVENOUS | Status: DC | PRN

## 2016-07-31 MED ORDER — LIDOCAINE 2% (20 MG/ML) 5 ML SYRINGE
INTRAMUSCULAR | Status: AC
  Filled 2016-07-31: qty 5

## 2016-07-31 MED ORDER — ATENOLOL 50 MG PO TABS
50.0000 mg | ORAL_TABLET | Freq: Every day | ORAL | Status: DC
  Administered 2016-08-01 – 2016-08-04 (×3): 50 mg via ORAL
  Filled 2016-07-31 (×3): qty 1

## 2016-07-31 MED ORDER — NIFEDIPINE ER OSMOTIC RELEASE 30 MG PO TB24
30.0000 mg | ORAL_TABLET | Freq: Every day | ORAL | Status: DC
  Administered 2016-08-01: 30 mg via ORAL
  Filled 2016-07-31 (×5): qty 1

## 2016-07-31 MED ORDER — PHENOL 1.4 % MT LIQD: 1.0000 | OROMUCOSAL | Status: DC | PRN

## 2016-07-31 MED ORDER — NITROGLYCERIN 0.4 MG SL SUBL: 0.4000 mg | SUBLINGUAL_TABLET | SUBLINGUAL | Status: DC | PRN

## 2016-07-31 MED ORDER — BUPIVACAINE-EPINEPHRINE (PF) 0.5% -1:200000 IJ SOLN
INTRAMUSCULAR | Status: DC | PRN
  Administered 2016-07-31: 30 mL via PERINEURAL

## 2016-07-31 MED ORDER — TAMSULOSIN HCL 0.4 MG PO CAPS
0.4000 mg | ORAL_CAPSULE | Freq: Every day | ORAL | Status: DC
  Administered 2016-07-31 – 2016-08-03 (×4): 0.4 mg via ORAL
  Filled 2016-07-31 (×4): qty 1

## 2016-07-31 MED ORDER — DABIGATRAN ETEXILATE MESYLATE 150 MG PO CAPS
150.0000 mg | ORAL_CAPSULE | Freq: Two times a day (BID) | ORAL | Status: DC
  Administered 2016-07-31 – 2016-08-04 (×8): 150 mg via ORAL
  Filled 2016-07-31 (×9): qty 1

## 2016-07-31 MED ORDER — PROPOFOL 10 MG/ML IV BOLUS
INTRAVENOUS | Status: DC | PRN
  Administered 2016-07-31: 200 mg via INTRAVENOUS

## 2016-07-31 MED ORDER — MIDAZOLAM HCL 2 MG/2ML IJ SOLN
1.0000 mg | Freq: Once | INTRAMUSCULAR | Status: AC
  Administered 2016-07-31: 1 mg via INTRAVENOUS

## 2016-07-31 MED ORDER — SUGAMMADEX SODIUM 200 MG/2ML IV SOLN
INTRAVENOUS | Status: DC | PRN
  Administered 2016-07-31: 200 mg via INTRAVENOUS

## 2016-07-31 MED ORDER — MIDAZOLAM HCL 2 MG/2ML IJ SOLN
INTRAMUSCULAR | Status: AC
  Administered 2016-07-31: 1 mg via INTRAVENOUS
  Filled 2016-07-31: qty 2

## 2016-07-31 MED ORDER — HYDROCHLOROTHIAZIDE 12.5 MG PO CAPS
12.5000 mg | ORAL_CAPSULE | Freq: Every day | ORAL | Status: DC
  Administered 2016-07-31 – 2016-08-01 (×2): 12.5 mg via ORAL
  Filled 2016-07-31 (×4): qty 1

## 2016-07-31 MED ORDER — LACTATED RINGERS IV SOLN
INTRAVENOUS | Status: DC
Start: 2016-07-31 — End: 2016-08-03
  Administered 2016-07-31 (×3): via INTRAVENOUS

## 2016-07-31 MED ORDER — METOCLOPRAMIDE HCL 5 MG PO TABS: 5.0000 mg | ORAL_TABLET | Freq: Three times a day (TID) | ORAL | Status: DC | PRN

## 2016-07-31 MED ORDER — MENTHOL 3 MG MT LOZG: 1.0000 | LOZENGE | OROMUCOSAL | Status: DC | PRN

## 2016-07-31 MED ORDER — FENTANYL CITRATE (PF) 100 MCG/2ML IJ SOLN
INTRAMUSCULAR | Status: AC
  Administered 2016-07-31: 50 ug via INTRAVENOUS
  Filled 2016-07-31: qty 2

## 2016-07-31 MED ORDER — ACETAMINOPHEN 325 MG PO TABS: 650.0000 mg | ORAL_TABLET | Freq: Four times a day (QID) | ORAL | Status: DC | PRN

## 2016-07-31 MED ORDER — SUCCINYLCHOLINE CHLORIDE 200 MG/10ML IV SOSY
PREFILLED_SYRINGE | INTRAVENOUS | Status: AC
  Filled 2016-07-31: qty 10

## 2016-07-31 MED ORDER — LISINOPRIL-HYDROCHLOROTHIAZIDE 20-12.5 MG PO TABS: 1.0000 | ORAL_TABLET | Freq: Every day | ORAL | Status: DC

## 2016-07-31 MED ORDER — BUPIVACAINE HCL (PF) 0.25 % IJ SOLN
INTRAMUSCULAR | Status: AC
  Filled 2016-07-31: qty 30

## 2016-07-31 MED ORDER — PANTOPRAZOLE SODIUM 40 MG PO TBEC
40.0000 mg | DELAYED_RELEASE_TABLET | Freq: Every day | ORAL | Status: DC
  Administered 2016-07-31 – 2016-08-04 (×5): 40 mg via ORAL
  Filled 2016-07-31 (×5): qty 1

## 2016-07-31 MED ORDER — DOCUSATE SODIUM 100 MG PO CAPS
100.0000 mg | ORAL_CAPSULE | Freq: Two times a day (BID) | ORAL | Status: DC
  Administered 2016-07-31 – 2016-08-04 (×8): 100 mg via ORAL
  Filled 2016-07-31 (×8): qty 1

## 2016-07-31 MED ORDER — BUPIVACAINE HCL (PF) 0.25 % IJ SOLN
INTRAMUSCULAR | Status: DC | PRN
  Administered 2016-07-31: 20 mL

## 2016-07-31 MED ORDER — ROCURONIUM BROMIDE 100 MG/10ML IV SOLN
INTRAVENOUS | Status: DC | PRN
  Administered 2016-07-31: 50 mg via INTRAVENOUS
  Administered 2016-07-31: 20 mg via INTRAVENOUS

## 2016-07-31 MED ORDER — ONDANSETRON HCL 4 MG/2ML IJ SOLN: 4.0000 mg | Freq: Once | INTRAMUSCULAR | Status: DC | PRN

## 2016-07-31 MED ORDER — SODIUM CHLORIDE 0.9 % IR SOLN
Status: DC | PRN
  Administered 2016-07-31: 3000 mL

## 2016-07-31 MED ORDER — CEFAZOLIN SODIUM-DEXTROSE 2-4 GM/100ML-% IV SOLN
2.0000 g | Freq: Three times a day (TID) | INTRAVENOUS | Status: AC
Start: 2016-07-31 — End: 2016-08-01
  Administered 2016-07-31 – 2016-08-01 (×2): 2 g via INTRAVENOUS
  Filled 2016-07-31 (×2): qty 100

## 2016-07-31 MED ORDER — ATORVASTATIN CALCIUM 40 MG PO TABS
40.0000 mg | ORAL_TABLET | Freq: Every day | ORAL | Status: DC
  Administered 2016-07-31 – 2016-08-03 (×4): 40 mg via ORAL
  Filled 2016-07-31 (×4): qty 1

## 2016-07-31 MED ORDER — FENTANYL CITRATE (PF) 100 MCG/2ML IJ SOLN
INTRAMUSCULAR | Status: DC | PRN
  Administered 2016-07-31: 100 ug via INTRAVENOUS
  Administered 2016-07-31: 50 ug via INTRAVENOUS

## 2016-07-31 MED ORDER — ONDANSETRON HCL 4 MG/2ML IJ SOLN: 4.0000 mg | Freq: Four times a day (QID) | INTRAMUSCULAR | Status: DC | PRN

## 2016-07-31 MED ORDER — HYDROMORPHONE HCL 1 MG/ML IJ SOLN: 1.0000 mg | INTRAMUSCULAR | Status: DC | PRN

## 2016-07-31 MED ORDER — ASPIRIN EC 325 MG PO TBEC
325.0000 mg | DELAYED_RELEASE_TABLET | Freq: Every day | ORAL | Status: DC
  Administered 2016-08-01 – 2016-08-03 (×3): 325 mg via ORAL
  Filled 2016-07-31 (×3): qty 1

## 2016-07-31 MED ORDER — FENTANYL CITRATE (PF) 100 MCG/2ML IJ SOLN
INTRAMUSCULAR | Status: AC
  Filled 2016-07-31: qty 2

## 2016-07-31 MED ORDER — METHOCARBAMOL 500 MG PO TABS
500.0000 mg | ORAL_TABLET | Freq: Four times a day (QID) | ORAL | Status: DC | PRN
  Administered 2016-07-31 – 2016-08-03 (×7): 500 mg via ORAL
  Filled 2016-07-31 (×8): qty 1

## 2016-07-31 MED ORDER — ONDANSETRON HCL 4 MG/2ML IJ SOLN
INTRAMUSCULAR | Status: DC | PRN
Start: 2016-07-31 — End: 2016-07-31
  Administered 2016-07-31: 4 mg via INTRAVENOUS

## 2016-07-31 MED ORDER — ACETAMINOPHEN 650 MG RE SUPP: 650.0000 mg | Freq: Four times a day (QID) | RECTAL | Status: DC | PRN

## 2016-07-31 MED ORDER — FLUOXETINE HCL 20 MG PO CAPS
20.0000 mg | ORAL_CAPSULE | Freq: Every day | ORAL | Status: DC
  Administered 2016-07-31 – 2016-08-04 (×5): 20 mg via ORAL
  Filled 2016-07-31 (×5): qty 1

## 2016-07-31 MED ORDER — LISINOPRIL 20 MG PO TABS
20.0000 mg | ORAL_TABLET | Freq: Every day | ORAL | Status: DC
  Administered 2016-07-31 – 2016-08-01 (×2): 20 mg via ORAL
  Filled 2016-07-31 (×4): qty 1

## 2016-07-31 MED ORDER — PROPOFOL 10 MG/ML IV BOLUS
INTRAVENOUS | Status: AC
  Filled 2016-07-31: qty 20

## 2016-07-31 MED ORDER — BISACODYL 10 MG RE SUPP: 10.0000 mg | Freq: Every day | RECTAL | Status: DC | PRN

## 2016-07-31 MED ORDER — METOCLOPRAMIDE HCL 5 MG/ML IJ SOLN: 5.0000 mg | Freq: Three times a day (TID) | INTRAMUSCULAR | Status: DC | PRN

## 2016-07-31 MED ORDER — OXYCODONE HCL 5 MG PO TABS
ORAL_TABLET | ORAL | Status: AC
  Filled 2016-07-31: qty 2

## 2016-07-31 MED ORDER — EPHEDRINE SULFATE-NACL 50-0.9 MG/10ML-% IV SOSY
PREFILLED_SYRINGE | INTRAVENOUS | Status: DC | PRN
  Administered 2016-07-31 (×2): 15 mg via INTRAVENOUS

## 2016-07-31 MED ORDER — POLYETHYLENE GLYCOL 3350 17 G PO PACK
17.0000 g | PACK | Freq: Every day | ORAL | Status: DC | PRN
  Administered 2016-08-03: 17 g via ORAL
  Filled 2016-07-31: qty 1

## 2016-07-31 MED ORDER — SUCCINYLCHOLINE CHLORIDE 20 MG/ML IJ SOLN
INTRAMUSCULAR | Status: DC | PRN
  Administered 2016-07-31: 120 mg via INTRAVENOUS

## 2016-07-31 MED ORDER — ALLOPURINOL 300 MG PO TABS
300.0000 mg | ORAL_TABLET | Freq: Every day | ORAL | Status: DC
  Administered 2016-07-31 – 2016-08-01 (×2): 300 mg via ORAL
  Administered 2016-08-02: 150 mg via ORAL
  Filled 2016-07-31 (×3): qty 1

## 2016-07-31 MED ORDER — OXYCODONE HCL 5 MG PO TABS
5.0000 mg | ORAL_TABLET | ORAL | Status: DC | PRN
  Administered 2016-07-31 – 2016-08-03 (×10): 10 mg via ORAL
  Filled 2016-07-31 (×9): qty 2

## 2016-07-31 MED ORDER — FENTANYL CITRATE (PF) 100 MCG/2ML IJ SOLN
50.0000 ug | Freq: Once | INTRAMUSCULAR | Status: AC
  Administered 2016-07-31: 50 ug via INTRAVENOUS

## 2016-07-31 MED ORDER — MIDAZOLAM HCL 2 MG/2ML IJ SOLN
INTRAMUSCULAR | Status: AC
  Filled 2016-07-31: qty 2

## 2016-07-31 MED ORDER — LIDOCAINE HCL (CARDIAC) 20 MG/ML IV SOLN
INTRAVENOUS | Status: DC | PRN
  Administered 2016-07-31: 50 mg via INTRAVENOUS

## 2016-07-31 MED ORDER — FENTANYL CITRATE (PF) 100 MCG/2ML IJ SOLN
25.0000 ug | INTRAMUSCULAR | Status: DC | PRN
  Administered 2016-07-31 (×2): 50 ug via INTRAVENOUS

## 2016-07-31 SURGICAL SUPPLY — 73 items
BANDAGE ACE 4X5 VEL STRL LF (GAUZE/BANDAGES/DRESSINGS) ×3 IMPLANT
BANDAGE ACE 6X5 VEL STRL LF (GAUZE/BANDAGES/DRESSINGS) ×3 IMPLANT
BANDAGE ESMARK 6X9 LF (GAUZE/BANDAGES/DRESSINGS) ×1 IMPLANT
BENZOIN TINCTURE PRP APPL 2/3 (GAUZE/BANDAGES/DRESSINGS) ×3 IMPLANT
BLADE SAGITTAL 25.0X1.19X90 (BLADE) ×2 IMPLANT
BLADE SAGITTAL 25.0X1.19X90MM (BLADE) ×1
BLADE SAW SGTL 13X75X1.27 (BLADE) ×3 IMPLANT
BNDG ELASTIC 6X10 VLCR STRL LF (GAUZE/BANDAGES/DRESSINGS) ×3 IMPLANT
BNDG ESMARK 6X9 LF (GAUZE/BANDAGES/DRESSINGS) ×3
BOWL SMART MIX CTS (DISPOSABLE) ×3 IMPLANT
CAP KNEE TOTAL 3 SIGMA ×3 IMPLANT
CEMENT HV SMART SET (Cement) ×6 IMPLANT
CLOSURE WOUND 1/2 X4 (GAUZE/BANDAGES/DRESSINGS) ×2
COVER SURGICAL LIGHT HANDLE (MISCELLANEOUS) ×3 IMPLANT
CUFF TOURNIQUET SINGLE 34IN LL (TOURNIQUET CUFF) ×3 IMPLANT
CUFF TOURNIQUET SINGLE 44IN (TOURNIQUET CUFF) IMPLANT
DRAPE ORTHO SPLIT 77X108 STRL (DRAPES) ×4
DRAPE SURG ORHT 6 SPLT 77X108 (DRAPES) ×2 IMPLANT
DRAPE U-SHAPE 47X51 STRL (DRAPES) ×3 IMPLANT
DRSG PAD ABDOMINAL 8X10 ST (GAUZE/BANDAGES/DRESSINGS) ×3 IMPLANT
DURAPREP 26ML APPLICATOR (WOUND CARE) ×3 IMPLANT
ELECT REM PT RETURN 9FT ADLT (ELECTROSURGICAL) ×3
ELECTRODE REM PT RTRN 9FT ADLT (ELECTROSURGICAL) ×1 IMPLANT
EVACUATOR 1/8 PVC DRAIN (DRAIN) IMPLANT
FACESHIELD WRAPAROUND (MASK) ×3 IMPLANT
GAUZE SPONGE 4X4 12PLY STRL (GAUZE/BANDAGES/DRESSINGS) ×3 IMPLANT
GAUZE XEROFORM 1X8 LF (GAUZE/BANDAGES/DRESSINGS) ×3 IMPLANT
GAUZE XEROFORM 5X9 LF (GAUZE/BANDAGES/DRESSINGS) ×3 IMPLANT
GLOVE BIOGEL PI IND STRL 7.5 (GLOVE) ×3 IMPLANT
GLOVE BIOGEL PI IND STRL 8 (GLOVE) ×2 IMPLANT
GLOVE BIOGEL PI INDICATOR 7.5 (GLOVE) ×6
GLOVE BIOGEL PI INDICATOR 8 (GLOVE) ×4
GLOVE ORTHO TXT STRL SZ7.5 (GLOVE) ×6 IMPLANT
GOWN STRL REUS W/ TWL LRG LVL3 (GOWN DISPOSABLE) ×1 IMPLANT
GOWN STRL REUS W/ TWL XL LVL3 (GOWN DISPOSABLE) ×1 IMPLANT
GOWN STRL REUS W/TWL 2XL LVL3 (GOWN DISPOSABLE) ×3 IMPLANT
GOWN STRL REUS W/TWL LRG LVL3 (GOWN DISPOSABLE) ×2
GOWN STRL REUS W/TWL XL LVL3 (GOWN DISPOSABLE) ×2
HANDPIECE INTERPULSE COAX TIP (DISPOSABLE) ×2
IMMOBILIZER KNEE 22 UNIV (SOFTGOODS) ×3 IMPLANT
KIT BASIN OR (CUSTOM PROCEDURE TRAY) ×3 IMPLANT
KIT ROOM TURNOVER OR (KITS) ×3 IMPLANT
MANIFOLD NEPTUNE II (INSTRUMENTS) ×3 IMPLANT
MARKER SKIN DUAL TIP RULER LAB (MISCELLANEOUS) ×3 IMPLANT
NEEDLE HYPO 25GX1X1/2 BEV (NEEDLE) ×3 IMPLANT
NS IRRIG 1000ML POUR BTL (IV SOLUTION) ×3 IMPLANT
PACK TOTAL JOINT (CUSTOM PROCEDURE TRAY) ×3 IMPLANT
PACK UNIVERSAL I (CUSTOM PROCEDURE TRAY) ×3 IMPLANT
PAD ARMBOARD 7.5X6 YLW CONV (MISCELLANEOUS) ×6 IMPLANT
PAD CAST 4YDX4 CTTN HI CHSV (CAST SUPPLIES) ×1 IMPLANT
PADDING CAST ABS 4INX4YD NS (CAST SUPPLIES) ×2
PADDING CAST ABS COTTON 4X4 ST (CAST SUPPLIES) ×1 IMPLANT
PADDING CAST COTTON 4X4 STRL (CAST SUPPLIES) ×2
PADDING CAST COTTON 6X4 STRL (CAST SUPPLIES) ×3 IMPLANT
SET HNDPC FAN SPRY TIP SCT (DISPOSABLE) ×1 IMPLANT
STAPLER VISISTAT 35W (STAPLE) ×3 IMPLANT
STRIP CLOSURE SKIN 1/2X4 (GAUZE/BANDAGES/DRESSINGS) ×4 IMPLANT
SUCTION FRAZIER HANDLE 10FR (MISCELLANEOUS) ×2
SUCTION TUBE FRAZIER 10FR DISP (MISCELLANEOUS) ×1 IMPLANT
SUT VIC AB 0 CT1 27 (SUTURE) ×2
SUT VIC AB 0 CT1 27XBRD ANBCTR (SUTURE) ×1 IMPLANT
SUT VIC AB 1 CT1 27 (SUTURE) ×4
SUT VIC AB 1 CT1 27XBRD ANTBC (SUTURE) ×2 IMPLANT
SUT VIC AB 1 CTX 18 (SUTURE) ×6 IMPLANT
SUT VIC AB 1 CTX 27 (SUTURE) ×3 IMPLANT
SUT VIC AB 2-0 CT1 27 (SUTURE) ×4
SUT VIC AB 2-0 CT1 TAPERPNT 27 (SUTURE) ×2 IMPLANT
SUT VIC AB 3-0 X1 27 (SUTURE) ×3 IMPLANT
SYR CONTROL 10ML LL (SYRINGE) ×3 IMPLANT
TOWEL OR 17X24 6PK STRL BLUE (TOWEL DISPOSABLE) ×3 IMPLANT
TOWEL OR 17X26 10 PK STRL BLUE (TOWEL DISPOSABLE) ×3 IMPLANT
TRAY FOLEY CATH 16FRSI W/METER (SET/KITS/TRAYS/PACK) ×3 IMPLANT
WATER STERILE IRR 1000ML POUR (IV SOLUTION) ×6 IMPLANT

## 2016-07-31 NOTE — Brief Op Note (Signed)
07/31/2016  3:21 PM  PATIENT:  Evan Warner  65 y.o. male  PRE-OPERATIVE DIAGNOSIS:  Osteoarthritis Left Knee  POST-OPERATIVE DIAGNOSIS:  Osteoarthritis Left Knee  PROCEDURE:  Procedure(s): LEFT TOTAL KNEE ARTHROPLASTY (Left)  SURGEON:  Surgeon(s) and Role:    * Eldred MangesMark C Yates, MD - Primary  PHYSICIAN ASSISTANT: Zonia Kiefjames Sharne Linders pa-c    ANESTHESIA:   general  EBL:  Total I/O In: 1000 [I.V.:1000] Out: -   BLOOD ADMINISTERED:none  DRAINS: none   LOCAL MEDICATIONS USED:  MARCAINE     SPECIMEN:  No Specimen  DISPOSITION OF SPECIMEN:  N/A  COUNTS:  YES  TOURNIQUET:   Total Tourniquet Time Documented: Thigh (Left) - 73 minutes Total: Thigh (Left) - 73 minutes   DICTATION: .Reubin Milanragon Dictation  PLAN OF CARE: Admit to inpatient   PATIENT DISPOSITION:  PACU - hemodynamically stable.

## 2016-07-31 NOTE — Anesthesia Preprocedure Evaluation (Addendum)
Anesthesia Evaluation  Patient identified by MRN, date of birth, ID band Patient awake    Reviewed: Allergy & Precautions, NPO status , Patient's Chart, lab work & pertinent test results  History of Anesthesia Complications Negative for: history of anesthetic complications  Airway Mallampati: II  TM Distance: >3 FB Neck ROM: Full    Dental  (+) Dental Advisory Given, Missing   Pulmonary sleep apnea and Continuous Positive Airway Pressure Ventilation , former smoker,    Pulmonary exam normal breath sounds clear to auscultation       Cardiovascular hypertension, Pt. on home beta blockers and Pt. on medications + CAD, + Past MI and + Cardiac Stents  + dysrhythmias Atrial Fibrillation  Rhythm:Irregular Rate:Normal     Neuro/Psych PSYCHIATRIC DISORDERS (mood disorder) negative neurological ROS     GI/Hepatic Neg liver ROS, GERD  Medicated,  Endo/Other  Morbid obesity  Renal/GU negative Renal ROS     Musculoskeletal  (+) Arthritis , Osteoarthritis,    Abdominal   Peds  Hematology  (+) Blood dyscrasia (Pradaxa--last took Saturday PM), ,   Anesthesia Other Findings Day of surgery medications reviewed with the patient.  Reproductive/Obstetrics                            Anesthesia Physical Anesthesia Plan  ASA: III  Anesthesia Plan: General and Regional   Post-op Pain Management:  Regional for Post-op pain   Induction: Intravenous  Airway Management Planned: Oral ETT  Additional Equipment:   Intra-op Plan:   Post-operative Plan: Extubation in OR  Informed Consent: I have reviewed the patients History and Physical, chart, labs and discussed the procedure including the risks, benefits and alternatives for the proposed anesthesia with the patient or authorized representative who has indicated his/her understanding and acceptance.   Dental advisory given  Plan Discussed with:  CRNA  Anesthesia Plan Comments: (Risks/benefits of general anesthesia discussed with patient including risk of damage to teeth, lips, gum, and tongue, nausea/vomiting, allergic reactions to medications, and the possibility of heart attack, stroke and death.  All patient questions answered.  Patient wishes to proceed.  Discussed risks and benefits of adductor canal block including failure, bleeding, infection, nerve damage, weakness. Discussed that the block may not prevent all of the pain in the knee. Questions answered. Patient consents to block. )        Anesthesia Quick Evaluation

## 2016-07-31 NOTE — H&P (Signed)
TOTAL KNEE ADMISSION H&P  Patient is being admitted for left total knee arthroplasty.  Subjective:  Chief Complaint:left knee pain.  HPI: Evan Warner, 65 y.o. male, has a history of pain and functional disability in the left knee due to arthritis and has failed non-surgical conservative treatments for greater than 12 weeks to includeNSAID's and/or analgesics, corticosteriod injections, use of assistive devices and activity modification.  Onset of symptoms was gradual, starting 10 years ago with gradually worsening course since that time.  .  Patient currently rates pain in the left knee(s) at 10 out of 10 with activity. Patient has worsening of pain with activity and weight bearing, pain that interferes with activities of daily living, crepitus and joint swelling.  Patient has evidence of subchondral sclerosis, periarticular osteophytes and joint space narrowing by imaging studies.. There is no active infection.  Patient Active Problem List   Diagnosis Date Noted  . OSA (obstructive sleep apnea) 01/23/2012  . ESSENTIAL HYPERTENSION, BENIGN 01/03/2010  . CORONARY ATHEROSCLEROSIS NATIVE CORONARY ARTERY 01/03/2010  . HYPERLIPIDEMIA-MIXED 05/08/2009  . OBESITY-MORBID (>100') 05/08/2009   Past Medical History:  Diagnosis Date  . A-fib (HCC)    chronic afib history per 02/20/16 [facility] cardioloy note  . Arthritis   . Coronary atherosclerosis of native coronary artery    DES ramus 2005, LVEF 55%.Takes Pradaxa daily  . Enlarged prostate    takes Flomax nightly  . Essential hypertension, benign    takes Atenolol,Lisinipril-HCTZ,and Procardia daily  . GERD (gastroesophageal reflux disease)    takes Pantoprazole daily  . Gout    takes Allopurinol daily  . History of kidney stones   . Hyperlipidemia    takes Simvastatin daily  . Joint pain   . Joint swelling   . MI (myocardial infarction) [facility]    [facility]  . Mood disorder (HCC)    takes Fluoxetine daily  .  Nocturia   . OSA (obstructive sleep apnea)    On CPAP  . Peripheral edema    occasionally  . Pneumonia    history of   . Urinary frequency     Past Surgical History:  Procedure Laterality Date  . ANKLE ARTHROSCOPY     Left  . BACK SURGERY    . CARDIAC CATHETERIZATION  2005  . CORONARY ANGIOPLASTY     1 stent  . skin tags removed    . TOTAL KNEE ARTHROPLASTY     Right    Prescriptions Prior to Admission  Medication Sig Dispense Refill Last Dose  . allopurinol (ZYLOPRIM) 300 MG tablet Take 300 mg by mouth daily.   07/30/2016 at Unknown time  . aspirin 81 MG chewable tablet Chew 81 mg by mouth daily.   07/30/2016 at Unknown time  . atenolol (TENORMIN) 50 MG tablet TAKE 1 TABLET BY MOUTH EVERY DAY 30 tablet 10 07/31/2016 at 0900  . dabigatran (PRADAXA) 150 MG CAPS capsule Take 150 mg by mouth 2 (two) times daily.   Past Week at Unknown time  . diclofenac (VOLTAREN) 75 MG EC tablet Take 75 mg by mouth 2 (two) times daily.   Past Week at Unknown time  . fish oil-omega-3 fatty acids 1000 MG capsule Take 1 capsule by mouth daily.     Past Week at Unknown time  . FLUoxetine (PROZAC) 20 MG capsule Take 20 mg by mouth daily.   07/30/2016 at Unknown time  . lisinopril-hydrochlorothiazide (PRINZIDE,ZESTORETIC) 20-12.5 MG tablet Take 1 tablet by mouth daily.   07/30/2016 at Unknown  time  . Multiple Vitamins-Minerals (MULTI COMPLETE PO) Take by mouth daily.   Past Week at Unknown time  . NIFEdipine (PROCARDIA-XL/ADALAT CC) 30 MG 24 hr tablet Take 30 mg by mouth daily.   07/30/2016 at Unknown time  . NON FORMULARY c-papa  And O2   07/30/2016 at Unknown time  . pantoprazole (PROTONIX) 40 MG tablet Take 40 mg by mouth daily.   07/30/2016 at Unknown time  . simvastatin (ZOCOR) 80 MG tablet Take 80 mg by mouth daily.   07/30/2016 at Unknown time  . Tamsulosin HCl (FLOMAX) 0.4 MG CAPS Take 0.4 mg by mouth at bedtime.     07/30/2016 at Unknown time  . clopidogrel (PLAVIX) 75 MG tablet TAKE 1 TABLET BY MOUTH  EVERY DAY (Patient not taking: Reported on 07/25/2016) 90 tablet 3 Not Taking at Unknown time  . Coenzyme Q-10 100 MG capsule Take 100 mg by mouth daily.     More than a month at Unknown time  . hydrochlorothiazide (MICROZIDE) 12.5 MG capsule Take 1 capsule (12.5 mg total) by mouth daily. (Patient not taking: Reported on 07/25/2016) 90 capsule 2 Not Taking at Unknown time  . nitroGLYCERIN (NITROSTAT) 0.4 MG SL tablet Place 0.4 mg under the tongue as needed.     Unknown at Unknown time  . ramipril (ALTACE) 10 MG capsule TAKE ONE CAPSULE BY MOUTH EVERY DAY (Patient not taking: Reported on 07/25/2016) 90 capsule 3 Not Taking at Unknown time  . VYTORIN 10-80 MG per tablet TAKE 1 TABLET BY MOUTH EVERY DAY (Patient not taking: Reported on 07/25/2016) 90 tablet 3 Not Taking at Unknown time   Allergies  Allergen Reactions  . No Known Allergies     Social History  Substance Use Topics  . Smoking status: Former Smoker    Types: Cigarettes  . Smokeless tobacco: Never Used     Comment: quit smoking in 2005  . Alcohol use Yes     Comment: occasionally beer    History reviewed. No pertinent family history.   Review of Systems  Constitutional: Negative.   Eyes: Negative.   Respiratory: Negative.   Cardiovascular: Negative.   Gastrointestinal: Negative.   Genitourinary: Negative.   Skin: Negative.   Neurological: Negative.   Psychiatric/Behavioral: Negative.     Objective:  Physical Exam  Constitutional: He is oriented to person, place, and time. He appears well-developed and well-nourished. No distress.  HENT:  Head: Normocephalic and atraumatic.  Eyes: EOM are normal. Pupils are equal, round, and reactive to light.  Neck: Normal range of motion.  Cardiovascular: Normal rate.   Respiratory: Effort normal. No respiratory distress.  GI: He exhibits no distension.  Musculoskeletal: He exhibits tenderness.  Neurological: He is alert and oriented to person, place, and time.  Skin: Skin is warm  and dry.  Psychiatric: He has a normal mood and affect.    Vital signs in last 24 hours: Temp:  [98.6 F (37 C)] 98.6 F (37 C) (09/20 1036) Pulse Rate:  [71] 71 (09/20 1036) Resp:  [20] 20 (09/20 1036) BP: (145)/(67) 145/67 (09/20 1036) SpO2:  [96 %] 96 % (09/20 1036) Weight:  [161 kg (355 lb)] 161 kg (355 lb) (09/20 1036)  Labs:   Estimated body mass index is 45.58 kg/m as calculated from the following:   Height as of 07/25/16: 6\' 2"  (1.88 m).   Weight as of this encounter: 161 kg (355 lb).   Imaging Review Plain radiographs demonstrate moderate degenerative joint disease of the left  knee(s). The overall alignment ismild varus. The bone quality appears to be good for age and reported activity level.  Assessment/Plan:  End stage arthritis, left knee   The patient history, physical examination, clinical judgment of the provider and imaging studies are consistent with end stage degenerative joint disease of the left knee(s) and total knee arthroplasty is deemed medically necessary. The treatment options including medical management, injection therapy arthroscopy and arthroplasty were discussed at length. The risks and benefits of total knee arthroplasty were presented and reviewed. The risks due to aseptic loosening, infection, stiffness, patella tracking problems, thromboembolic complications and other imponderables were discussed. The patient acknowledged the explanation, agreed to proceed with the plan and consent was signed. Patient is being admitted for inpatient treatment for surgery, pain control, PT, OT, prophylactic antibiotics, VTE prophylaxis, progressive ambulation and ADL's and discharge planning. The patient is planning to be discharged home with home health services

## 2016-07-31 NOTE — Transfer of Care (Signed)
Immediate Anesthesia Transfer of Care Note  Patient: Evan Warner  Procedure(s) Performed: Procedure(s): LEFT TOTAL KNEE ARTHROPLASTY (Left)  Patient Location: PACU  Anesthesia Type:General and GA combined with regional for post-op pain  Level of Consciousness: awake, alert , oriented and patient cooperative  Airway & Oxygen Therapy: Patient Spontanous Breathing and Patient connected to nasal cannula oxygen  Post-op Assessment: Report given to RN, Post -op Vital signs reviewed and stable and Patient moving all extremities X 4  Post vital signs: Reviewed and stable  Last Vitals:  Vitals:   07/31/16 1235 07/31/16 1240  BP:  136/61  Pulse: 72 87  Resp:    Temp:      Last Pain:  Vitals:   07/31/16 1036  TempSrc: Oral      Patients Stated Pain Goal: 8 (07/31/16 1130)  Complications: No apparent anesthesia complications

## 2016-07-31 NOTE — Anesthesia Procedure Notes (Signed)
Procedure Name: Intubation Date/Time: 07/31/2016 1:17 PM Performed by: Dorie RankQUINN, Brandi Tomlinson M Pre-anesthesia Checklist: Emergency Drugs available, Patient identified, Suction available, Patient being monitored and Timeout performed Patient Re-evaluated:Patient Re-evaluated prior to inductionOxygen Delivery Method: Circle system utilized Preoxygenation: Pre-oxygenation with 100% oxygen Intubation Type: IV induction Ventilation: Mask ventilation without difficulty Laryngoscope Size: Mac and 3 Grade View: Grade III Tube type: Oral Tube size: 7.5 mm Number of attempts: 1 Airway Equipment and Method: Stylet Placement Confirmation: ETT inserted through vocal cords under direct vision,  positive ETCO2 and breath sounds checked- equal and bilateral Secured at: 22 cm Tube secured with: Tape Dental Injury: Teeth and Oropharynx as per pre-operative assessment

## 2016-07-31 NOTE — Anesthesia Procedure Notes (Signed)
Anesthesia Regional Block:  Adductor canal block  Pre-Anesthetic Checklist: ,, timeout performed, Correct Patient, Correct Site, Correct Laterality, Correct Procedure, Correct Position, site marked, Risks and benefits discussed,  Surgical consent,  Pre-op evaluation,  At surgeon's request and post-op pain management  Laterality: Left  Prep: chloraprep       Needles:  Injection technique: Single-shot  Needle Type: Echogenic Needle     Needle Length: 9cm 9 cm Needle Gauge: 21 and 21 G    Additional Needles:  Procedures: ultrasound guided (picture in chart) Adductor canal block Narrative:  Injection made incrementally with aspirations every 5 mL.  Performed by: Personally  Anesthesiologist: Tiaunna Buford EDWARD  Additional Notes: No pain on injection. No increased resistance to injection. Injection made in 5cc increments.  Good needle visualization.  Patient tolerated procedure well.      

## 2016-07-31 NOTE — Progress Notes (Signed)
Orthopedic Tech Progress Note Patient Details:  Evan LabellaRoger D Warner 10/22/1951 [id number]  CPM Left Knee CPM Left Knee: On Left Knee Flexion (Degrees): 90 Left Knee Extension (Degrees): 0   Jennye MoccasinHughes, [name] 07/31/2016, 5:36 PM

## 2016-07-31 NOTE — Interval H&P Note (Signed)
History and Physical Interval Note:  07/31/2016 12:48 PM  Evan Warner  has presented today for surgery, with the diagnosis of Osteoarthritis Left Knee  The various methods of treatment have been discussed with the patient and family. After consideration of risks, benefits and other options for treatment, the patient has consented to  Procedure(s): LEFT TOTAL KNEE ARTHROPLASTY (Left) as a surgical intervention .  The patient's history has been reviewed, patient examined, no change in status, stable for surgery.  I have reviewed the patient's chart and labs.  Questions were answered to the patient's satisfaction.     Eldred MangesMark C Sheppard Luckenbach

## 2016-08-01 ENCOUNTER — Encounter (HOSPITAL_COMMUNITY): Payer: Self-pay | Admitting: Orthopaedic Surgery

## 2016-08-01 LAB — BASIC METABOLIC PANEL
Anion gap: 9 (ref 5–15)
BUN: 11 mg/dL (ref 6–20)
CALCIUM: 8.4 mg/dL — AB (ref 8.9–10.3)
CO2: 28 mmol/L (ref 22–32)
CREATININE: 1.17 mg/dL (ref 0.61–1.24)
Chloride: 101 mmol/L (ref 101–111)
GFR calc non Af Amer: >60 mL/min (ref >60)
Glucose, Bld: 155 mg/dL — ABNORMAL HIGH (ref 65–99)
Potassium: 3.6 mmol/L (ref 3.5–5.1)
Sodium: 138 mmol/L (ref 135–145)

## 2016-08-01 LAB — CBC
HEMATOCRIT: 37.2 % — AB (ref 39.0–52.0)
Hemoglobin: 12.1 g/dL — ABNORMAL LOW (ref 13.0–17.0)
MCH: 30.2 pg (ref 26.0–34.0)
MCHC: 32.5 g/dL (ref 30.0–36.0)
MCV: 92.8 fL (ref 78.0–100.0)
Platelets: 185 10*3/uL (ref 150–400)
RBC: 4.01 MIL/uL — ABNORMAL LOW (ref 4.22–5.81)
RDW: 13.5 % (ref 11.5–15.5)
WBC: 7.9 10*3/uL (ref 4.0–10.5)

## 2016-08-01 MED ORDER — DICLOFENAC SODIUM 75 MG PO TBEC
75.0000 mg | DELAYED_RELEASE_TABLET | Freq: Two times a day (BID) | ORAL | Status: DC
  Administered 2016-08-01 – 2016-08-04 (×6): 75 mg via ORAL
  Filled 2016-08-01 (×8): qty 1

## 2016-08-01 NOTE — Evaluation (Signed)
Physical Therapy Evaluation Patient Details Name: Evan LabellaRoger D Mellor MRN: [id number] DOB: 02/09/1951 Today's Date: 08/01/2016   History of Present Illness  Pt is a 65 y/o male s/p L TKA. PMH including but not limited to HTN, A-fib, obesity, gout, hx of MI in 2005 and previous R TKA (date unknown).   Clinical Impression  Pt presented supine in bed with HOB elevated, L LE in CPM (0-60), awake and willing to participate in therapy session. Prior to admission, pt stated that he was independent with functional mobility. Pt required mod A x2 to achieve sitting EOB, max A x3 to achieve standing and max A x2 to return to supine. At this time, PT recommending pt d/c to SNF for further intensive therapy services prior to returning home. Pt would continue to benefit from skilled physical therapy services at this time while admitted and after d/c to address his limitations in order to improve his overall safety and independence with functional mobility.     Follow Up Recommendations SNF    Equipment Recommendations  None recommended by PT    Recommendations for Other Services       Precautions / Restrictions Precautions Precautions: Fall;Knee Required Braces or Orthoses: Knee Immobilizer - Left Knee Immobilizer - Left: On except when in CPM Restrictions Weight Bearing Restrictions: Yes LLE Weight Bearing: Weight bearing as tolerated      Mobility  Bed Mobility Overal bed mobility: +2 for physical assistance;Needs Assistance Bed Mobility: Supine to Sit;Sit to Supine     Supine to sit: +2 for physical assistance;Mod assist;HOB elevated Sit to supine: +2 for physical assistance;Max assist   General bed mobility comments: pt required assist with movement of L LE and with upper body to achieve sitting EOB. He required more assistance to return to supine after standing from bed. Pt's son insisted on helping with bed mobility. Pt was able to use bilateral UEs on bed rails to reposition himself in  the bed.  Transfers Overall transfer level: Needs assistance Equipment used: Rolling walker (2 wheeled) Transfers: Sit to/from Stand Sit to Stand: Max assist;+2 physical assistance;From elevated surface (+3 (son insisted on helping))         General transfer comment: Pt made 3 attempts to stand from sitting EOB with max A x3. On the fourth attempt he was able to achieve standing with flexed posture and max A x3. Pt stood for approximately one minute.  Ambulation/Gait             General Gait Details: did not occur  Stairs            Wheelchair Mobility    Modified Rankin (Stroke Patients Only)       Balance Overall balance assessment: Needs assistance Sitting-balance support: Feet supported;Bilateral upper extremity supported Sitting balance-Leahy Scale: Poor     Standing balance support: During functional activity;Bilateral upper extremity supported Standing balance-Leahy Scale: Poor Standing balance comment: pt reliant on bilateral UEs on RW                              Pertinent Vitals/Pain Pain Assessment: Faces Faces Pain Scale: Hurts even more Pain Location: L knee Pain Descriptors / Indicators: Sore;Discomfort;Grimacing;Guarding;Heaviness Pain Intervention(s): Monitored during session;Repositioned;Ice applied    Home Living Family/patient expects to be discharged to:: Private residence Living Arrangements: Spouse/significant other Available Help at Discharge: Family;Available PRN/intermittently (wife, sister, son) Type of Home: House Home Access: Stairs to enter;Other (comment) (garage entrance)  Entrance Stairs-Rails: None Entrance Stairs-Number of Steps: 2 Home Layout: One level Home Equipment: Crutches;Walker - 2 wheels;Grab bars - toilet      Prior Function Level of Independence: Independent         Comments: pt reported that he enjoys playing golf     Hand Dominance   Dominant Hand: Right    Extremity/Trunk  Assessment   Upper Extremity Assessment: Defer to OT evaluation           Lower Extremity Assessment: LLE deficits/detail   LLE Deficits / Details: Pt with decreased strength and ROM limitations secondary to post-op. Sensation is impaired in L foot as compared to R.     Communication   Communication: No difficulties  Cognition Arousal/Alertness: Awake/alert;Suspect due to medications (more talkative per son's report) Behavior During Therapy: [facility] for tasks assessed/performed;Restless;Anxious Overall Cognitive Status: Within Functional Limits for tasks assessed                      General Comments      Exercises Total Joint Exercises Goniometric ROM: Flexion = 55 degrees; Extension = lacking 15 degrees to neutral; measured in supine   Assessment/Plan    PT Assessment Patient needs continued PT services  PT Problem List Decreased strength;Decreased range of motion;Decreased activity tolerance;Decreased balance;Decreased mobility;Decreased coordination;Decreased knowledge of use of DME;Pain          PT Treatment Interventions DME instruction;Gait training;Stair training;Functional mobility training;Therapeutic activities;Therapeutic exercise;Balance training;Neuromuscular re-education;Patient/family education    PT Goals (Current goals can be found in the Care Plan section)  Acute Rehab PT Goals Patient Stated Goal: go to rehab to increase strength prior to returning home PT Goal Formulation: With patient/family Time For Goal Achievement: 08/08/16 Potential to Achieve Goals: Good    Frequency 7X/week   Barriers to discharge        Co-evaluation PT/OT/SLP Co-Evaluation/Treatment: Yes Reason for Co-Treatment: For patient/therapist safety PT goals addressed during session: Mobility/safety with mobility;Balance;Proper use of DME;Strengthening/ROM         End of Session Equipment Utilized During Treatment: Gait belt;Left knee immobilizer Activity Tolerance:  Patient limited by fatigue;Patient limited by pain Patient left: in bed;with call bell/phone within reach;with family/visitor present Nurse Communication: Mobility status;Need for lift equipment         Time: 1007-1058 PT Time Calculation (min) (ACUTE ONLY): 51 min   Charges:   PT Evaluation $PT Eval Moderate Complexity: 1 Procedure     PT G CodesAlessandra Bevels Asuzena Weis 08/01/2016, 12:51 PM Deborah Chalk, PT, DPT [phone]

## 2016-08-01 NOTE — Plan of Care (Signed)
Problem: Safety: Goal: Ability to remain free from injury will improve Outcome: Progressing No injury noted, safety precautions and fall preventions maintained  Problem: Pain Managment: Goal: General experience of comfort will improve Outcome: Progressing Pain is manageable with oxycodone  Problem: Bowel/Gastric: Goal: Will not experience complications related to bowel motility Outcome: Progressing No bowel issues reported

## 2016-08-01 NOTE — Progress Notes (Signed)
Orthopedic Tech Progress Note Patient Details:  Evan Warner 07/15/1951 [id number]  Patient ID: Evan Warner, male   DOB: 11/09/1951, 65 y.o.   MRN: [id number] Applied cpm 0-50  Trinna PostMartinez, [name] 08/01/2016, 5:43 AM

## 2016-08-01 NOTE — Op Note (Signed)
Evan Warner:  Evan Warner, Evan Warner             ACCOUNT NO.:  [id number]  MEDICAL RECORD NO.:  [id number]  LOCATION:  [id number]                        FACILITY:  [facility]  PHYSICIAN:  Ryane Konieczny C. Ophelia CharterYates, M.D.    DATE OF BIRTH:  12-28-1950  DATE OF PROCEDURE:  07/31/2016 DATE OF DISCHARGE:                              OPERATIVE REPORT   PREOPERATIVE DIAGNOSIS:  Left knee osteoarthritis.  POSTOPERATIVE DIAGNOSIS:  Left knee osteoarthritis.  PROCEDURE:  Left total knee arthroplasty.  SURGEON:  Kisa Fujii C. Ophelia CharterYates, M.D.  ASSISTANT:  Genene ChurnJames M. Barry Dieneswens, PA-C, medically necessary and present for the entire procedure.  ANESTHESIA:  General plus preoperative adductor block.  IMPLANTS:  Sigma DePuy Rotating Platform #6 femur, #6 tibia, 10 mm rotating platform, #41, 3 peg patellar button.  DESCRIPTION OF PROCEDURE:  After standard prepping and draping with preoperative block, DuraPrep, and Ancef prophylaxis, proximal thigh tourniquet, heel bump, and lateral post had been applied.  Usual extremity sheets and drapes, impervious, Coban, sterile skin marker, Betadine, Steri-Drape were applied.  Time-out procedure.  Right leg wrapped with an Esmarch.  Tourniquet inflated to 350.  Midline incision was made.  Medial parapatellar incision was made.  Bovie was used for hemostasis.  Immediately noted was 2 cm loose body that was partially scarred down to the ACL, was mobile, and was removed with a rongeur with multiple bites.  Menisci were resected.  There were bone-on-bone changes, tricompartmental primary osteoarthritis changes.  Knee was tight, and initially plan was 10 mm resection on the femur.  We added 4 mm extra since the patient only had flexion to 70 degrees and lacked 10 degrees reaching full extension.  Extra 2 was added to the tibia.  The patient was large, had a #6 size on his opposite knee.  Chamfer cuts, box cuts were made and I took a considerable amount of time to resect the posterior aspect of the meniscus  since the patient's knee could not be flexed to 90 until large chunks of bone removed from the back of the knee.  Chamfer cuts, keel preparation, pulsatile lavage, vacuum mixing of cement, cementing of the tibia followed by femur, inserting the poly which was trialed first, and then the patella, which was prepared cutting from facet to facet and was sized for a #41.  Good patellar tracking.  Cement was hard at 15 minutes.  Tourniquet deflated.  All excess of cement had been removed.  There was a good alignment, good flexion and extension balance, and spacer blocks were checked during the case.  Hemostasis was obtained.  Standard layered closure.  Subcuticular closure on the skin.  Postop dressing and knee immobilizer.     Kaladin Noseworthy C. Ophelia CharterYates, M.D.     MCY/MEDQ  D:  07/31/2016  T:  08/01/2016  Job:  [id number]

## 2016-08-01 NOTE — Anesthesia Postprocedure Evaluation (Signed)
Anesthesia Post Note  Patient: Janett LabellaRoger D Montini  Procedure(s) Performed: Procedure(s) (LRB): LEFT TOTAL KNEE ARTHROPLASTY (Left)  Patient location during evaluation: PACU Anesthesia Type: General Level of consciousness: awake and alert Pain management: pain level controlled Vital Signs Assessment: post-procedure vital signs reviewed and stable Respiratory status: spontaneous breathing, nonlabored ventilation and respiratory function stable Cardiovascular status: blood pressure returned to baseline and stable Postop Assessment: no signs of nausea or vomiting Anesthetic complications: no    Last Vitals:  Vitals:   08/01/16 0530 08/01/16 1500  BP: 111/86 122/70  Pulse: 98 82  Resp: 18 20  Temp: 36.8 C 36.7 C    Last Pain:  Vitals:   08/01/16 1757  TempSrc:   PainSc: 4                  Sharis Keeran A

## 2016-08-01 NOTE — Care Management (Addendum)
Case manager spoke with patient concerning discharge plan. He states therapy informed him He will need shortterm rehab. Case manager notified Child psychotherapistocial worker. Patient is covered by the [employer].

## 2016-08-01 NOTE — Progress Notes (Signed)
Physical Therapy Treatment Patient Details Name: Evan LabellaRoger D Warner MRN: [id number] DOB: 09/19/1951 Today's Date: 08/01/2016    History of Present Illness Pt is a 65 y/o male s/p L TKA. PMH including but not limited to HTN, A-fib, obesity, gout, hx of MI in 2005 and previous R TKA (date unknown).     PT Comments    Pt presented supine in bed with HOB elevated, awake and willing to participate in therapy session. Pt's nurse was in room administering medications when PT entered. Pt participated in therapeutic exercises during session in supine. After completing exercises, pt stated that he was "tired" and too fatigued for further therapeutic interventions. Pt would continue to benefit from skilled physical therapy services at this time while admitted and after d/c to address his limitations in order to improve his overall safety and independence with functional mobility.   Follow Up Recommendations  SNF     Equipment Recommendations  None recommended by PT    Recommendations for Other Services       Precautions / Restrictions Precautions Precautions: Fall;Knee Required Braces or Orthoses: Knee Immobilizer - Left Knee Immobilizer - Left: On except when in CPM Restrictions Weight Bearing Restrictions: Yes LLE Weight Bearing: Weight bearing as tolerated    Mobility  Bed Mobility   General bed mobility comments: pt participated in therapeutic exercises in supine position  Transfers       General transfer comment: deferred to next session  Ambulation/Gait                 Stairs            Wheelchair Mobility    Modified Rankin (Stroke Patients Only)       Balance                    Cognition Arousal/Alertness: Awake/alert Behavior During Therapy: WFL for tasks assessed/performed Overall Cognitive Status: Within Functional Limits for tasks assessed                      Exercises Total Joint Exercises Ankle Circles/Pumps: AROM;Both;10  reps;Supine Quad Sets: AROM;Strengthening;Left;10 reps;Supine Short Arc Quad: AAROM;Left;10 reps;Supine Heel Slides: AROM;Strengthening;Left;10 reps;Supine Hip ABduction/ADduction: AAROM;Left;10 reps;Supine Straight Leg Raises: AAROM;Left;10 reps;Supine    General Comments        Pertinent Vitals/Pain Pain Assessment: Faces Faces Pain Scale: Hurts a little bit Pain Location: L knee Pain Descriptors / Indicators: Sore Pain Intervention(s): Monitored during session    Home Living Family/patient expects to be discharged to:: Private residence Living Arrangements: Spouse/significant other Available Help at Discharge: Other (Comment);Family;Available PRN/intermittently (wife, sister, son) Type of Home: House Home Access: Stairs to enter;Other (comment) Entrance Stairs-Rails: None Home Layout: One level Home Equipment: Crutches;Walker - 2 wheels;Grab bars - toilet      Prior Function Level of Independence: Independent      Comments: pt reported that he enjoys playing golf   PT Goals (current goals can now be found in the care plan section) Acute Rehab PT Goals Patient Stated Goal: go to rehab to increase strength prior to returning home PT Goal Formulation: With patient/family Time For Goal Achievement: 08/08/16 Potential to Achieve Goals: Good Progress towards PT goals: Progressing toward goals    Frequency    7X/week      PT Plan Current plan remains appropriate       End of Session Equipment Utilized During Treatment: Left knee immobilizer Activity Tolerance: Patient limited by fatigue;Patient limited by lethargy Patient  left: in bed;with call bell/phone within reach;with family/visitor present     Time: 4098-1191 PT Time Calculation (min) (ACUTE ONLY): 14 min  Charges:  $Therapeutic Exercise: 8-22 mins                    G Codes:      Alessandra Bevels Toney Lizaola 2016/08/09, 5:12 PM Deborah Chalk, PT, DPT [phone]

## 2016-08-01 NOTE — Progress Notes (Signed)
SW reached out to [facility]'s [facility] Work[facility]/ Ms.Mock to inform her that pt is in need of SNF. SW left a voicemail and is awaiting a call back to see if [facility] or if its okay to be referred to other facilities.  Crista CurbBrittney Ellayna Hilligoss, MSW [phone]

## 2016-08-01 NOTE — Evaluation (Signed)
Occupational Therapy Evaluation Patient Details Name: Evan Warner MRN: [id number] DOB: [birth year] Today's Date: 08/01/2016    History of Present Illness Pt is a 65 y/o male s/p L TKA. PMH including but not limited to HTN, A-fib, obesity, gout, hx of MI in 2005 and previous R TKA (date unknown).    Clinical Impression   Pt pleasant and willing to work with therapy today. PTA Pt independent in ADL and IADL. Pt states that he went off arthritis medication prior to surgery and it left him furniture walking and with decrease in mobility and function. Pt presenting with current deficits listed below. Due to size and weight of Pt, and current state of weakness in BLE he benefits from +2 assistance. Pt and son in agreement that he would benefit from short term stay at rehab facility to increase strength and independence for safety of Pt and caregivers.  Pt will benefit from skilled OT in the acute care setting to increase safety and adaptive knowledge and independence in completing ADL. OT to follow acutely.     Follow Up Recommendations  SNF;Supervision/Assistance - 24 hour    Equipment Recommendations  Other (comment) (TBD by next venue of care)    Recommendations for Other Services       Precautions / Restrictions Precautions Precautions: Fall;Knee Required Braces or Orthoses: Knee Immobilizer - Left Knee Immobilizer - Left: On except when in CPM Restrictions Weight Bearing Restrictions: Yes LLE Weight Bearing: Weight bearing as tolerated      Mobility Bed Mobility Overal bed mobility: +2 for physical assistance;Needs Assistance Bed Mobility: Supine to Sit;Sit to Supine     Supine to sit: +2 for physical assistance;Mod assist;HOB elevated Sit to supine: +2 for physical assistance;Max assist   General bed mobility comments: pt required assist with movement of L LE and with upper body to achieve sitting EOB. He required more assistance to return to supine after standing from  bed. Pt's son insisted on helping with bed mobility. Pt was able to use bilateral UEs on bed rails to reposition himself in the bed.  Transfers Overall transfer level: Needs assistance Equipment used: Rolling walker (2 wheeled) Transfers: Sit to/from Stand Sit to Stand: Max assist;+2 physical assistance;From elevated surface (+3 (son insisted on helping))         General transfer comment: Pt made 3 attempts to stand from sitting EOB with max A x3. On the fourth attempt he was able to achieve standing with flexed posture and max A x3. Pt stood for approximately one minute.    Balance Overall balance assessment: Needs assistance Sitting-balance support: Feet supported;Bilateral upper extremity supported Sitting balance-Leahy Scale: Poor     Standing balance support: During functional activity;Bilateral upper extremity supported Standing balance-Leahy Scale: Poor Standing balance comment: pt reliant on bilateral UEs on RW                             ADL Overall ADL's : Needs assistance/impaired Eating/Feeding: Set up;Bed level   Grooming: Wash/dry face;Set up;Bed level Grooming Details (indicate cue type and reason): Pt able to peform bed level ADL with HOB elevated         Upper Body Dressing : Sitting;Minimal assistance;Bed level Upper Body Dressing Details (indicate cue type and reason): to don second gown to cover the back Lower Body Dressing: Total assistance Lower Body Dressing Details (indicate cue type and reason): Pt unable to don/doff socks but expressing a desire  to wear shorts (not present in  room) Toilet Transfer: +2 for physical assistance;+2 for safety/equipment;Requires wide/bariatric Toilet Transfer Details (indicate cue type and reason): no bariatric BSC in room, Pt willing but unable to transfer to recliner. Stood with max assit from 2 therapists and son for approx 60 seconds         Functional mobility during ADLs: +2 for physical assistance;+2  for safety/equipment;Caregiver able to provide necessary level of assistance;Rolling walker General ADL Comments: Pt unable to ambulate, Pt able to stand with 2 therapist assist and son helping (son insisted on helping). Pt with increased tenderness in RLE he says due to being off medication prior to surgery. Pt with major change in independence and strength. Pt morbidly obese, and needs bariatric equipment.     Vision     Perception     Praxis      Pertinent Vitals/Pain Pain Assessment: Faces Faces Pain Scale: Hurts even more Pain Location: L knee Pain Descriptors / Indicators: Sore;Discomfort;Grimacing;Guarding;Heaviness Pain Intervention(s): Monitored during session;Repositioned;Ice applied     Hand Dominance Right   Extremity/Trunk Assessment Upper Extremity Assessment Upper Extremity Assessment: Overall WFL for tasks assessed   Lower Extremity Assessment Lower Extremity Assessment: LLE deficits/detail LLE Deficits / Details: Pt with decreased strength and ROM limitations secondary to post-op. Sensation is impaired in L foot as compared to R.       Communication Communication Communication: No difficulties   Cognition Arousal/Alertness: Awake/alert;Suspect due to medications (more talkative per son's report)) Behavior During Therapy: [facility] for tasks assessed/performed;Restless;Anxious Overall Cognitive Status: Within Functional Limits for tasks assessed                     General Comments       Exercises Exercises: Total Joint     Shoulder Instructions      Home Living Family/patient expects to be discharged to:: Private residence Living Arrangements: Spouse/significant other Available Help at Discharge: Other (Comment);Family;Available PRN/intermittently (wife, sister, son) Type of Home: House Home Access: Stairs to enter;Other (comment) Entrance Stairs-Number of Steps: 2 Entrance Stairs-Rails: None Home Layout: One level     Bathroom Shower/Tub:  Producer, television/film/video: Handicapped height Bathroom Accessibility: Yes How Accessible: Accessible via walker Home Equipment: Crutches;Walker - 2 wheels;Grab bars - toilet          Prior Functioning/Environment Level of Independence: Independent        Comments: pt reported that he enjoys playing golf        OT Problem List: Decreased strength;Decreased range of motion;Decreased activity tolerance;Impaired balance (sitting and/or standing);Decreased safety awareness;Decreased knowledge of use of DME or AE;Obesity;Pain   OT Treatment/Interventions:      OT Goals(Current goals can be found in the care plan section) Acute Rehab OT Goals Patient Stated Goal: go to rehab to increase strength prior to returning home OT Goal Formulation: With patient/family Time For Goal Achievement: 08/08/16 Potential to Achieve Goals: Good ADL Goals Pt Will Perform Grooming: with supervision;sitting Pt Will Perform Lower Body Dressing: with min assist;sit to/from stand;with adaptive equipment Pt Will Transfer to Toilet: ambulating;bedside commode;with mod assist Pt Will Perform Toileting - Clothing Manipulation and hygiene: sit to/from stand;with min assist Pt Will Perform Tub/Shower Transfer: with mod assist;rolling walker;tub bench  OT Frequency: Min 2X/week   Barriers to D/C:            Co-evaluation PT/OT/SLP Co-Evaluation/Treatment: Yes Reason for Co-Treatment: For patient/therapist safety PT goals addressed during session: Mobility/safety with mobility;Balance;Proper use  of DME;Strengthening/ROM OT goals addressed during session: ADL's and self-care      End of Session Equipment Utilized During Treatment: Gait belt;Rolling walker;Left knee immobilizer CPM Left Knee CPM Left Knee: Off Nurse Communication: Other (comment) (in room)  Activity Tolerance: Patient limited by fatigue Patient left: in bed;with call bell/phone within reach;with family/visitor present;with  nursing/sitter in room   Time: 1010-1102 OT Time Calculation (min): 52 min Charges:  OT General Charges $OT Visit: 1 Procedure OT Evaluation $OT Eval Moderate Complexity: 1 Procedure OT Treatments $Self Care/Home Management : 8-22 mins G-Codes:    Evern BioLaura J Akela Pocius 08/01/2016, 3:57 PM  Sherryl MangesLaura Margorie Renner OTR/L [phone]

## 2016-08-01 NOTE — Progress Notes (Signed)
Subjective: 1 Day Post-Op Procedure(s) (LRB): LEFT TOTAL KNEE ARTHROPLASTY (Left) Patient reports pain as moderate.    Objective: Vital signs in last 24 hours: Temp:  [97.8 F (36.6 C)-100.6 F (38.1 C)] 98.2 F (36.8 C) (09/21 0530) Pulse Rate:  [66-98] 98 (09/21 0530) Resp:  [17-25] 18 (09/21 0530) BP: (111-161)/(55-86) 111/86 (09/21 0530) SpO2:  [91 %-100 %] 98 % (09/21 0530) Weight:  [161 kg (355 lb)] 161 kg (355 lb) (09/20 1036)  Intake/Output from previous day: 09/20 0701 - 09/21 0700 In: 3085 [P.O.:240; I.V.:2845] Out: 1600 [Urine:1600] Intake/Output this shift: No intake/output data recorded.   Recent Labs  08/01/16 0636  HGB 12.1*    Recent Labs  08/01/16 0636  WBC 7.9  RBC 4.01*  HCT 37.2*  PLT 185    Recent Labs  08/01/16 0636  NA 138  K 3.6  CL 101  CO2 28  BUN 11  CREATININE 1.17  GLUCOSE 155*  CALCIUM 8.4*   No results for input(s): LABPT, INR in the last 72 hours.  Neurologically intact  Assessment/Plan: 1 Day Post-Op Procedure(s) (LRB): LEFT TOTAL KNEE ARTHROPLASTY (Left) Up with therapy  Eldred MangesMark C Daryl Beehler 08/01/2016, 7:39 AM

## 2016-08-01 NOTE — Progress Notes (Signed)
Patient refused CPAP for tonight, asked that machine be removed from room.  RT removed machine.

## 2016-08-02 LAB — CBC
HEMATOCRIT: 33.5 % — AB (ref 39.0–52.0)
HEMOGLOBIN: 10.8 g/dL — AB (ref 13.0–17.0)
MCH: 30.3 pg (ref 26.0–34.0)
MCHC: 32.2 g/dL (ref 30.0–36.0)
MCV: 93.8 fL (ref 78.0–100.0)
Platelets: 184 10*3/uL (ref 150–400)
RBC: 3.57 MIL/uL — AB (ref 4.22–5.81)
RDW: 13.6 % (ref 11.5–15.5)
WBC: 9.4 10*3/uL (ref 4.0–10.5)

## 2016-08-02 MED ORDER — OXYCODONE-ACETAMINOPHEN 7.5-325 MG PO TABS: 1.0000 | ORAL_TABLET | Freq: Four times a day (QID) | ORAL | 0 refills | Status: DC | PRN

## 2016-08-02 MED ORDER — ASPIRIN EC 81 MG PO TBEC: 81.0000 mg | DELAYED_RELEASE_TABLET | Freq: Every day | ORAL | Status: DC

## 2016-08-02 MED ORDER — DOCUSATE SODIUM 100 MG PO CAPS: 100.0000 mg | ORAL_CAPSULE | Freq: Two times a day (BID) | ORAL | 0 refills | Status: DC

## 2016-08-02 MED ORDER — MAGNESIUM CITRATE PO SOLN
1.0000 | Freq: Once | ORAL | Status: AC
  Administered 2016-08-02: 1 via ORAL
  Filled 2016-08-02: qty 296

## 2016-08-02 MED ORDER — ALLOPURINOL 300 MG PO TABS
150.0000 mg | ORAL_TABLET | Freq: Every day | ORAL | Status: DC
  Administered 2016-08-03 – 2016-08-04 (×2): 150 mg via ORAL
  Filled 2016-08-02 (×2): qty 1

## 2016-08-02 MED ORDER — METHOCARBAMOL 500 MG PO TABS: 500.0000 mg | ORAL_TABLET | Freq: Four times a day (QID) | ORAL | 0 refills | Status: DC | PRN

## 2016-08-02 MED ORDER — ASPIRIN 325 MG PO TBEC: 325.0000 mg | DELAYED_RELEASE_TABLET | Freq: Every day | ORAL | 0 refills | Status: DC

## 2016-08-02 NOTE — Progress Notes (Signed)
Occupational Therapy Treatment Patient Details Name: Janett LabellaRoger D Percifield MRN: [id number] DOB: 06/07/1951 Today's Date: 08/02/2016    History of present illness Pt is a 65 y/o male s/p L TKA. PMH including but not limited to HTN, A-fib, obesity, gout, hx of MI in 2005 and previous R TKA (date unknown).    OT comments  Pt. Making gains with skilled OT.  Able to complete bed mobility, stand pivot transfer, and use of A/E this session.  Focus of next session toilet and tub transfers.    Follow Up Recommendations  SNF;Supervision/Assistance - 24 hour    Equipment Recommendations  Other (comment)    Recommendations for Other Services      Precautions / Restrictions Precautions Precautions: Fall;Knee Required Braces or Orthoses: Knee Immobilizer - Left Knee Immobilizer - Left: On except when in CPM Restrictions LLE Weight Bearing: Weight bearing as tolerated       Mobility Bed Mobility Overal bed mobility: Needs Assistance Bed Mobility: Supine to Sit     Supine to sit: Min assist     General bed mobility comments: HOB flat, no rail, able to use b ues and push through elbows into sitting with min a to guid trunk.  able to scoot hips to eob   Transfers Overall transfer level: Needs assistance Equipment used: Rolling walker (2 wheeled) Transfers: Sit to/from [employer] CorporationStand;Stand Pivot Transfers Sit to Stand: Mod assist Stand pivot transfers: Min assist       General transfer comment: sit/stand with RW and pivot transfer to recliner.  states he has a "big boy" RW that he will have his wife bring for him to use    Balance                                   ADL Overall ADL's : Needs assistance/impaired             Lower Body Bathing: With adaptive equipment;Minimal assistance;Sitting/lateral leans Lower Body Bathing Details (indicate cue type and reason): simulated with demo of A/E, pt. states his wife will be assisting and A/E not needed     Lower Body Dressing:  Sitting/lateral leans;With adaptive equipment;Minimal assistance Lower Body Dressing Details (indicate cue type and reason): pt. able to return demo of use of sock aide and reacher.  states wife will be assisting with LB needs no A/E needs Toilet Transfer: Minimal assistance;Stand-pivot;Ambulation;RW Toilet Transfer Details (indicate cue type and reason): simulated with functional mobility transferring from eob to recliner Toileting- Clothing Manipulation and Hygiene: Sit to/from stand;Maximal assistance Toileting - Clothing Manipulation Details (indicate cue type and reason): simulated during activities in room       General ADL Comments: pt. with improved mobility this day, states pain is also well managed.  able to complete bed mobility, and stand pivot transfer.  introduced and provided demo and return demo of A/E.  pt. not interested in use      Vision                     Perception     Praxis      Cognition   Behavior During Therapy: [facility] for tasks assessed/performed Overall Cognitive Status: Within Functional Limits for tasks assessed                       Extremity/Trunk Assessment  Exercises     Shoulder Instructions       General Comments      Pertinent Vitals/ Pain       Pain Assessment: No/denies pain Pain Intervention(s): Premedicated before session  Home Living                                          Prior Functioning/Environment              Frequency  Min 2X/week        Progress Toward Goals  OT Goals(current goals can now be found in the care plan section)  Progress towards OT goals: Progressing toward goals     Plan Discharge plan remains appropriate    Co-evaluation                 End of Session Equipment Utilized During Treatment: Gait belt;Rolling walker;Left knee immobilizer CPM Left Knee CPM Left Knee: Off   Activity Tolerance Patient tolerated treatment well    Patient Left in chair;with call bell/phone within reach   Nurse Communication          Time: 1610-9604 OT Time Calculation (min): 25 min  Charges: OT General Charges $OT Visit: 1 Procedure OT Treatments $Self Care/Home Management : 23-37 mins  Robet Leu, COTA/L 08/02/2016, 9:55 AM

## 2016-08-02 NOTE — Progress Notes (Signed)
PASRR: [id number]  Evan Warner, MSW [phone]

## 2016-08-02 NOTE — Progress Notes (Signed)
Subjective: Doing well.  No complaints.  Waiting for transfer to snf. Has not had a bowel movement.  No nausea/vomiting.    Objective: Vital signs in last 24 hours: Temp:  [98.1 F (36.7 C)-98.3 F (36.8 C)] 98.1 F (36.7 C) (09/22 0506) Pulse Rate:  [64-100] 77 (09/22 1033) Resp:  [18-20] 18 (09/22 0506) BP: (82-122)/(50-70) 84/50 (09/22 1033) SpO2:  [92 %-98 %] 92 % (09/22 0506)  Intake/Output from previous day: 09/21 0701 - 09/22 0700 In: 760 [P.O.:760] Out: 650 [Urine:650] Intake/Output this shift: Total I/O In: 240 [P.O.:240] Out: 200 [Urine:200]   Recent Labs  08/01/16 0636 08/02/16 0454  HGB 12.1* 10.8*    Recent Labs  08/01/16 0636 08/02/16 0454  WBC 7.9 9.4  RBC 4.01* 3.57*  HCT 37.2* 33.5*  PLT 185 184    Recent Labs  08/01/16 0636  NA 138  K 3.6  CL 101  CO2 28  BUN 11  CREATININE 1.17  GLUCOSE 155*  CALCIUM 8.4*   No results for input(s): LABPT, INR in the last 72 hours.  Exam:  Alert and oriented.  NAD.  aquacel dressing intact.  NVI.   Assessment/Plan: 1)Will give mag citrate now.   2)Hypotensive- blood pressure meds held and 300cc bolus ordered per Dr Ophelia CharterYates.   3)Plan for transfer to SNF when bed available. Patient states that he is ok with [place] if [place].  Discharge summary complete and scripts on chart.    Evan Warner,Evan Warner 08/02/2016, 11:53 AM

## 2016-08-02 NOTE — Progress Notes (Signed)
SW has spoke with patient [facility] social worker Occupational psychologistQuiana Mock. She faxed SW a referral sheet and informed her to fill it out and fax clinicals. SW completed referral sheet and sent clinicals to the fax number she requested at [phone].  [place]. [facility] social worker states their team usually gives calls regarding pt's around 3:00pm.  Crista CurbBrittney Garold Sheeler, MSW [phone]

## 2016-08-02 NOTE — Progress Notes (Signed)
Physical Therapy Treatment Patient Details Name: Evan LabellaRoger D Warner MRN: [id number] DOB: 11/10/1951 Today's Date: 08/02/2016    History of Present Illness Pt is a 65 y/o male s/p L TKA. PMH including but not limited to HTN, A-fib, obesity, gout, hx of MI in 2005 and previous R TKA (date unknown).     PT Comments    Pt presented supine in bed with HOB elevated, L LE in CPM, awake and willing to participate in therapy session. Pt's wife was present throughout session as well. Pt participated in multiple LE strengthening exercises in supine in bed and making good progress with strength of L LE as noted by less assistance needed. Pt would continue to benefit from skilled physical therapy services at this time while admitted and after d/c to address his limitations in order to improve his overall safety and independence with functional mobility.    Follow Up Recommendations  SNF     Equipment Recommendations  None recommended by PT    Recommendations for Other Services       Precautions / Restrictions Precautions Precautions: Fall;Knee Required Braces or Orthoses: Knee Immobilizer - Left Knee Immobilizer - Left: On except when in CPM Restrictions Weight Bearing Restrictions: Yes LLE Weight Bearing: Weight bearing as tolerated    Mobility  Bed Mobility               General bed mobility comments: pt performed therapeutic exercises in supine  Transfers                 General transfer comment: pt performed therapeutic exercises in supine  Ambulation/Gait                 Stairs            Wheelchair Mobility    Modified Rankin (Stroke Patients Only)       Balance                                    Cognition Arousal/Alertness: Awake/alert Behavior During Therapy: WFL for tasks assessed/performed Overall Cognitive Status: Within Functional Limits for tasks assessed                      Exercises Total Joint  Exercises Ankle Circles/Pumps: AROM;Both;20 reps;Supine Quad Sets: AROM;Strengthening;Left;10 reps;Supine Gluteal Sets: AROM;Strengthening;Both;10 reps;Supine Short Arc Quad: AAROM;Left;10 reps;Supine Heel Slides: AROM;Strengthening;Left;10 reps;Supine Hip ABduction/ADduction: AAROM;Left;10 reps;Supine Straight Leg Raises: AROM;Strengthening;Left;10 reps;Supine Bridges: AROM;Strengthening;Both;10 reps;Supine    General Comments        Pertinent Vitals/Pain Pain Assessment: 0-10 Pain Score: 6  Pain Location: L knee Pain Descriptors / Indicators: Grimacing;Guarding;Sore Pain Intervention(s): Monitored during session;Repositioned    Home Living                      Prior Function            PT Goals (current goals can now be found in the care plan section) Acute Rehab PT Goals Patient Stated Goal: go to rehab to increase strength prior to returning home PT Goal Formulation: With patient/family Time For Goal Achievement: 08/08/16 Potential to Achieve Goals: Good Progress towards PT goals: Progressing toward goals    Frequency    7X/week      PT Plan Current plan remains appropriate    Co-evaluation             End of Session  Activity Tolerance: Patient limited by fatigue;Patient limited by pain Patient left: in bed;with call bell/phone within reach;with family/visitor present     Time: 1645-1701 PT Time Calculation (min) (ACUTE ONLY): 16 min  Charges:  $Therapeutic Exercise: 8-22 mins                    G CodesAlessandra Bevels Denyce Harr 2016/08/17, 5:17 PM Deborah Chalk, PT, DPT [phone]

## 2016-08-02 NOTE — Clinical Social Work Note (Signed)
Clinical Social Work Assessment  Patient Details  Name: Evan LabellaRoger D Mcalpine MRN: [id number] Date of Birth: 11/24/1950  Date of referral:  08/02/16               Reason for consult:  Facility Placement                Permission sought to share information with:   (Facilities) Permission granted to share information::   (Facilities)  Name::        Agency::     Relationship::     Contact Information:     Housing/Transportation Living arrangements for the past 2 months:  Single Family Home (Patient lives at home with his wife.) Source of Information:  Patient Patient Interpreter Needed:  None Criminal Activity/Legal Involvement Pertinent to Current Situation/Hospitalization:  No - Comment as needed Significant Relationships:  Spouse Lives with:  Spouse Do you feel safe going back to the place where you live?   (Patient interested in facility) Need for family participation in patient care:  Yes (Comment)  Care giving concerns:  Patient needs assistance with ADLs.   Social Worker assessment / plan:  Patient needs assistance with ADLs. He and family are interested in a facility. [facility] will [facility] due to that being his primary insurance.  Employment status:  Retired Health and safety inspectornsurance information:   (VA) PT Recommendations:  Skilled Nursing Facility Information / Referral to community resources:     Patient/Family's Response to care:  Appropriate.   Patient/Family's Understanding of and Emotional Response to Diagnosis, Current Treatment, and Prognosis:  No questions.  Emotional Assessment Appearance:  Appears stated age Attitude/Demeanor/Rapport:   (Appropriate.) Affect (typically observed):  Accepting Orientation:  Oriented to Self, Oriented to Place, Oriented to  Time, Oriented to Situation Alcohol / Substance use:    Psych involvement (Current and /or in the community):  No (Comment)  Discharge Needs  Concerns to be addressed:  Adjustment to Illness Readmission within the last 30  days:  No Current discharge risk:  None Barriers to Discharge:  No Barriers Identified   Alene MiresWhitaker, Lorri Fukuhara R 08/02/2016, 3:39 PM

## 2016-08-02 NOTE — Progress Notes (Signed)
Dr. Ophelia CharterYates contacted patient's blood pressure continues to be low.  Last reading is 84/50 and pulse was 77.  Verbal order to administer 300 cc of IV NS.  Will continue to monitor.

## 2016-08-02 NOTE — Progress Notes (Signed)
Physical Therapy Treatment Patient Details Name: Evan Warner MRN: [id number] DOB: 09/22/1951 Today's Date: 08/02/2016    History of Present Illness Pt is a 65 y/o male s/p L TKA. PMH including but not limited to HTN, A-fib, obesity, gout, hx of MI in 2005 and previous R TKA (date unknown).     PT Comments    Pt presented sitting OOB in recliner when PT entered room. Pt making good progress since previous session; however, still requiring mod A x2 to achieve standing from sitting position in recliner. Pt participated in gait training today with L knee immobilizer donned as he was not able to perform any SLR on L LE. Pt would continue to benefit from skilled physical therapy services at this time while admitted and after d/c to address his limitations in order to improve his overall safety and independence with functional mobility.   Follow Up Recommendations  SNF     Equipment Recommendations  None recommended by PT    Recommendations for Other Services       Precautions / Restrictions Precautions Precautions: Fall;Knee Required Braces or Orthoses: Knee Immobilizer - Left Knee Immobilizer - Left: On except when in CPM Restrictions Weight Bearing Restrictions: Yes LLE Weight Bearing: Weight bearing as tolerated    Mobility  Bed Mobility Overal bed mobility: Needs Assistance Bed Mobility: Supine to Sit     Supine to sit: Min assist     General bed mobility comments: pt sitting OOB in recliner when PT entered room  Transfers Overall transfer level: Needs assistance Equipment used: Rolling walker (2 wheeled) Transfers: Sit to/from Stand Sit to Stand: Mod assist;+2 physical assistance Stand pivot transfers: Min assist       General transfer comment: pt required mod A x2 to achieve full standing position from recliner  Ambulation/Gait Ambulation/Gait assistance: Min guard Ambulation Distance (Feet): 20 Feet (20 ft x4 with standing rest breaks and one seated rest  break) Assistive device: Rolling walker (2 wheeled) Gait Pattern/deviations: Step-to pattern;Step-through pattern;Decreased step length - right;Decreased stance time - left;Decreased weight shift to left Gait velocity: decreased Gait velocity interpretation: Below normal speed for age/gender General Gait Details: pt required VC'ing for sequencing with RW and postural cues   Stairs            Wheelchair Mobility    Modified Rankin (Stroke Patients Only)       Balance Overall balance assessment: Needs assistance Sitting-balance support: Feet supported;No upper extremity supported Sitting balance-Leahy Scale: Fair     Standing balance support: During functional activity;Bilateral upper extremity supported Standing balance-Leahy Scale: Poor                      Cognition Arousal/Alertness: Awake/alert Behavior During Therapy: WFL for tasks assessed/performed Overall Cognitive Status: Within Functional Limits for tasks assessed                      Exercises Total Joint Exercises Goniometric ROM: Flexion = 55 degrees; Extension = lacking 15 degrees; measured in sitting    General Comments        Pertinent Vitals/Pain Pain Assessment: Faces Faces Pain Scale: Hurts little more Pain Location: L knee Pain Descriptors / Indicators: Grimacing;Guarding Pain Intervention(s): Monitored during session;Repositioned;Ice applied    Home Living                      Prior Function  PT Goals (current goals can now be found in the care plan section) Acute Rehab PT Goals Patient Stated Goal: go to rehab to increase strength prior to returning home PT Goal Formulation: With patient/family Time For Goal Achievement: 08/08/16 Potential to Achieve Goals: Good Progress towards PT goals: Progressing toward goals    Frequency    7X/week      PT Plan Current plan remains appropriate    Co-evaluation             End of Session  Equipment Utilized During Treatment: Gait belt;Left knee immobilizer Activity Tolerance: Patient limited by fatigue;Patient limited by pain Patient left: in chair;with call bell/phone within reach     Time: 0950-1017 PT Time Calculation (min) (ACUTE ONLY): 27 min  Charges:  $Gait Training: 23-37 mins                    G CodesAlessandra Bevels:      Shephanie Romas M Caitriona Sundquist 08/02/2016, 11:53 AM Deborah ChalkJennifer Kinslie Hove, PT, DPT [phone]

## 2016-08-02 NOTE — Discharge Instructions (Signed)

## 2016-08-02 NOTE — Progress Notes (Signed)
SW spoke with Fuller [place]Quiana Mock of [facility] who confirms that [phone] is the correct fax number for patient referrals. Ella BodoQuianna states that she called team to inform them abut clinicals. She states that multiple people have informed them about issues with information coming through to their fax number today. Ella BodoQuianna states that she called to see if they would review pt information due to SW faxing it in early this morning before 11:00am. However, she states that the intake team will look at intake for patient Monday, and not to fax any information for the patient until Monday.  Ella BodoQuianna states that if approved Monday, the patient should be able to go to the facility the same day. Lenox PondsQuainna states that upon discharge to page her so she can arrange transportation for the patient.  Ferd GlassingQuinna Pager [phone]  Crista CurbBrittney Milan Clare, MSW [phone]

## 2016-08-02 NOTE — Discharge Summary (Signed)
Patient ID: Evan Warner MRN: [id number] DOB/AGE: 65/02/1951 65 y.o.  Admit date: 07/31/2016 Discharge date: 08/02/2016  Admission Diagnoses:  Active Problems:   Left knee DJD   Discharge Diagnoses:  Active Problems:   Left knee DJD  status post Procedure(s): LEFT TOTAL KNEE ARTHROPLASTY  Past Medical History:  Diagnosis Date  . A-fib (HCC)    chronic afib history per 02/20/16 [facility] cardioloy note  . Arthritis   . Coronary atherosclerosis of native coronary artery    DES ramus 2005, LVEF 55%.Takes Pradaxa daily  . Enlarged prostate    takes Flomax nightly  . Essential hypertension, benign    takes Atenolol,Lisinipril-HCTZ,and Procardia daily  . GERD (gastroesophageal reflux disease)    takes Pantoprazole daily  . Gout    takes Allopurinol daily  . History of kidney stones   . Hyperlipidemia    takes Simvastatin daily  . Joint pain   . Joint swelling   . [place] [facility] infarction[facility]    [facility] [place] [place]  . Mood disorder (HCC)    takes Fluoxetine daily  . Nocturia   . OSA (obstructive sleep apnea)    On CPAP  . Peripheral edema    occasionally  . Pneumonia    history of   . Urinary frequency     Surgeries: Procedure(s): LEFT TOTAL KNEE ARTHROPLASTY on 07/31/2016   Consultants:   Discharged Condition: Improved  Hospital Course: Evan LabellaRoger D Pursell is an 65 y.o. male who was admitted 07/31/2016 for operative treatment of knee djd. Patient failed conservative treatments (please see the history and physical for the specifics) and had severe unremitting pain that affects sleep, daily activities and work/hobbies. After pre-op clearance, the patient was taken to the operating room on 07/31/2016 and underwent  Procedure(s): LEFT TOTAL KNEE ARTHROPLASTY.    Patient was given perioperative antibiotics: Anti-infectives    Start     Dose/Rate Route Frequency Ordered Stop   07/31/16 2100  ceFAZolin (ANCEF) IVPB 2g/100 mL premix     2 g 200 mL/hr  over 30 Minutes Intravenous Every 8 hours 07/31/16 1716 08/01/16 0535   07/31/16 1215  ceFAZolin (ANCEF) 3 g in dextrose 5 % 50 mL IVPB     3 g 130 mL/hr over 30 Minutes Intravenous To [facility] 07/30/16 0952 07/31/16 1321       Patient was given sequential compression devices and early ambulation to prevent DVT.   Patient benefited maximally from hospital stay and there were no complications. At the time of discharge, the patient was urinating/moving their bowels without difficulty, tolerating a regular diet, pain is controlled with oral pain medications and they have been cleared by PT/OT.   Recent vital signs: Patient Vitals for the past 24 hrs:  BP Temp Temp src Pulse Resp SpO2  08/02/16 1033 (!) 84/50 - - 77 - -  08/02/16 0701 (!) 99/59 - - - - -  08/02/16 0506 (!) 82/52 98.1 F (36.7 C) Oral 64 18 92 %  08/01/16 2124 111/60 98.3 F (36.8 C) Oral 100 18 93 %  08/01/16 1500 122/70 98.1 F (36.7 C) Oral 82 20 98 %     Recent laboratory studies:  Recent Labs  08/01/16 0636 08/02/16 0454  WBC 7.9 9.4  HGB 12.1* 10.8*  HCT 37.2* 33.5*  PLT 185 184  NA 138  --   K 3.6  --   CL 101  --   CO2 28  --   BUN 11  --  CREATININE 1.17  --   GLUCOSE 155*  --   CALCIUM 8.4*  --      Discharge Medications:     Medication List    STOP taking these medications   aspirin 81 MG chewable tablet Replaced by:  aspirin 325 MG EC tablet   diclofenac 75 MG EC tablet Commonly known as:  VOLTAREN   MULTI COMPLETE PO     TAKE these medications   allopurinol 300 MG tablet Commonly known as:  ZYLOPRIM Take 300 mg by mouth daily.   aspirin EC 81 MG tablet Take 1 tablet (81 mg total) by mouth daily.   aspirin 325 MG EC tablet Take 1 tablet (325 mg total) by mouth daily with breakfast. Start taking on:  08/03/2016 Replaces:  aspirin 81 MG chewable tablet   atenolol 50 MG tablet Commonly known as:  TENORMIN TAKE 1 TABLET BY MOUTH EVERY DAY   Coenzyme Q-10 100 MG  capsule Take 100 mg by mouth daily.   dabigatran 150 MG Caps capsule Commonly known as:  PRADAXA Take 150 mg by mouth 2 (two) times daily.   docusate sodium 100 MG capsule Commonly known as:  COLACE Take 1 capsule (100 mg total) by mouth 2 (two) times daily.   fish oil-omega-3 fatty acids 1000 MG capsule Take 1 capsule by mouth daily.   FLUoxetine 20 MG capsule Commonly known as:  PROZAC Take 20 mg by mouth daily.   lisinopril-hydrochlorothiazide 20-12.5 MG tablet Commonly known as:  PRINZIDE,ZESTORETIC Take 1 tablet by mouth daily.   methocarbamol 500 MG tablet Commonly known as:  ROBAXIN Take 1 tablet (500 mg total) by mouth every 6 (six) hours as needed for muscle spasms.   NIFEdipine 30 MG 24 hr tablet Commonly known as:  PROCARDIA-XL/ADALAT CC Take 30 mg by mouth daily.   nitroGLYCERIN 0.4 MG SL tablet Commonly known as:  NITROSTAT Place 0.4 mg under the tongue as needed.   NON FORMULARY c-papa  And O2   oxyCODONE-acetaminophen 7.5-325 MG tablet Commonly known as:  PERCOCET Take 1 tablet by mouth every 6 (six) hours as needed for severe pain.   pantoprazole 40 MG tablet Commonly known as:  PROTONIX Take 40 mg by mouth daily.   simvastatin 80 MG tablet Commonly known as:  ZOCOR Take 80 mg by mouth daily.   tamsulosin 0.4 MG Caps capsule Commonly known as:  FLOMAX Take 0.4 mg by mouth at bedtime.       Diagnostic Studies: Dg Chest 2 View  Result Date: 07/25/2016 CLINICAL DATA:  65 year old male preoperative study for left knee surgery. Initial encounter. Former smoker. EXAM: CHEST  2 VIEW COMPARISON:  CTA chest 10/07/2010 and earlier. FINDINGS: Mild to moderate cardiomegaly appears increased since 2011. Other mediastinal contours are within normal limits. Visualized tracheal air column is within normal limits. Lung volumes remain stable and within normal limits. Mild eventration of the right hemidiaphragm. No pneumothorax, pulmonary edema, pleural effusion  or confluent pulmonary opacity. No acute osseous abnormality identified. IMPRESSION: Cardiomegaly has increased since 2011. No acute cardiopulmonary abnormality. Electronically Signed   By: Odessa Fleming M.D.   On: 07/25/2016 18:42        Discharge Plan:  discharge to snf  Follow up appointment: Needs return office visit 2 weeks postop.  Call office to schedule appointment.     Signed: Naida Sleight for  Annell Greening MD  [facility] [phone] 08/02/2016, 11:33 AM

## 2016-08-03 LAB — CBC
HEMATOCRIT: 33.4 % — AB (ref 39.0–52.0)
HEMOGLOBIN: 10.7 g/dL — AB (ref 13.0–17.0)
MCH: 30.1 pg (ref 26.0–34.0)
MCHC: 32 g/dL (ref 30.0–36.0)
MCV: 93.8 fL (ref 78.0–100.0)
Platelets: 214 10*3/uL (ref 150–400)
RBC: 3.56 MIL/uL — AB (ref 4.22–5.81)
RDW: 13.7 % (ref 11.5–15.5)
WBC: 8.6 10*3/uL (ref 4.0–10.5)

## 2016-08-03 NOTE — Progress Notes (Addendum)
Physical Therapy Treatment Patient Details Name: Evan Warner MRN: [id number] DOB: 04/14/1951 Today's Date: 08/03/2016    History of Present Illness Pt is a 65 y/o male s/p L TKA. PMH including but not limited to HTN, A-fib, obesity, gout, hx of MI in 2005 and previous R TKA (date unknown).     PT Comments    Pt presented supine in bed with HOB elevated, awake and willing to participate in therapy session. Pt making good progress towards achieving his functional goals. Pt would continue to benefit from skilled physical therapy services at this time while admitted and after d/c to address his limitations in order to improve his overall safety and independence with functional mobility. PT planning to perform stair training at next session.   Follow Up Recommendations  Home health PT;Supervision for mobility/OOB     Equipment Recommendations  None recommended by PT    Recommendations for Other Services       Precautions / Restrictions Precautions Precautions: Fall;Knee Required Braces or Orthoses: Knee Immobilizer - Left Knee Immobilizer - Left: On except when in CPM;Other (comment) (pt still unable to perform SLR x10 independently) Restrictions Weight Bearing Restrictions: Yes LLE Weight Bearing: Weight bearing as tolerated    Mobility  Bed Mobility Overal bed mobility: Needs Assistance Bed Mobility: Supine to Sit     Supine to sit: Min guard;HOB elevated     General bed mobility comments: Pt required increased time and heavy use of bedrails  Transfers Overall transfer level: Needs assistance Equipment used: Rolling walker (2 wheeled) Transfers: Sit to/from Stand Sit to Stand: Mod assist         General transfer comment: pt required mod A to power up and achieve full standing position from recliner and from bed (not elevated)  Ambulation/Gait Ambulation/Gait assistance: Min guard Ambulation Distance (Feet): 20 Feet (20 ft x5 with standing rest breaks  frequently and one sitting rest break) Assistive device: Rolling walker (2 wheeled) Gait Pattern/deviations: Step-to pattern;Step-through pattern;Decreased step length - right;Decreased stance time - left;Decreased weight shift to left Gait velocity: decreased Gait velocity interpretation: Below normal speed for age/gender General Gait Details: pt required VC'ing for sequencing with RW and postural cues   Stairs            Wheelchair Mobility    Modified Rankin (Stroke Patients Only)       Balance Overall balance assessment: Needs assistance Sitting-balance support: Feet supported;No upper extremity supported Sitting balance-Leahy Scale: Fair     Standing balance support: During functional activity;Bilateral upper extremity supported Standing balance-Leahy Scale: Poor Standing balance comment: pt heavily reliant on bilateral UEs on RW                    Cognition Arousal/Alertness: Awake/alert Behavior During Therapy: WFL for tasks assessed/performed Overall Cognitive Status: Within Functional Limits for tasks assessed                      Exercises Total Joint Exercises Goniometric ROM: Flexion = 55 degrees; Extension = lacking 15 degrees to neutral; measured in sitting    General Comments        Pertinent Vitals/Pain Pain Assessment: Faces Faces Pain Scale: Hurts a little bit Pain Location: L knee Pain Descriptors / Indicators: Sore Pain Intervention(s): Monitored during session;Repositioned    Home Living                      Prior Function  PT Goals (current goals can now be found in the care plan section) Acute Rehab PT Goals Patient Stated Goal: to get stronger PT Goal Formulation: With patient/family Time For Goal Achievement: 08/08/16 Potential to Achieve Goals: Good Progress towards PT goals: Progressing toward goals    Frequency    7X/week      PT Plan Discharge plan needs to be updated     Co-evaluation             End of Session Equipment Utilized During Treatment: Gait belt;Left knee immobilizer Activity Tolerance: Patient limited by fatigue Patient left: Other (comment);with call bell/phone within reach (pt sitting on toilet to attempt BM; pt agreeable to use call bell in bathroom when finished so that a staff member could assist him back to bed)     Time: 1324-4010 PT Time Calculation (min) (ACUTE ONLY): 43 min  Charges:  $Gait Training: 8-22 mins $Therapeutic Activity: 23-37 mins                    G CodesAlessandra Bevels Hughey Rittenberry 08/23/2016, 11:38 AM Deborah Chalk, PT, DPT [phone]

## 2016-08-03 NOTE — Care Management Note (Addendum)
Case Management Note  Patient Details  Name: Janett LabellaRoger D Mariotti MRN: [id number] Date of Birth: 12/26/1950  Subjective/Objective: 65 yr old male s/p left total knee arthroplasty.                   Action/Plan: Case manager and Dr. Ophelia CharterYates spoke with patient concerning discharge plan. Dr. Ophelia CharterYates aware [facility], patient ambulating more and He and Dr. Ophelia CharterYates [facility]. Patient states he has rolling walker, doesn't need 3in1, has elevated toilets and has grab bars.  CM explained to Dr. Ophelia CharterYates that [facility].    Expected Discharge Date:   08/04/16               Expected Discharge Plan:  Home w Home Health Services  In-House Referral:  Clinical Social Work  Discharge planning Services  CM Consult  Post Acute Care Choice:  Home Health Choice offered to:  Patient  DME Arranged:    DME Agency:     HH Arranged:  PT HH Agency:  Other - See comment (to be determined by [facility])  Status of Service:  In process, will continue to follow  If discussed at Long Length of Stay Meetings, dates discussed:    Additional Comments:  Durenda GuthrieBrady, Mckinnon Glick Naomi, RN 08/03/2016, 9:53 AM

## 2016-08-03 NOTE — Progress Notes (Signed)
Orthopedic Tech Progress Note Patient Details:  Janett LabellaRoger D Longbottom 08/08/1951 [id number]  CPM Left Knee CPM Left Knee: On Left Knee Flexion (Degrees): 50 Left Knee Extension (Degrees): 0   Saul FordyceJennifer C Hadley Detloff 08/03/2016, 2:36 PM

## 2016-08-03 NOTE — Progress Notes (Signed)
Physical Therapy Treatment Patient Details Name: Evan Warner MRN: [id number] DOB: [birth year] Today's Date: 08/03/2016    History of Present Illness Pt is a 65 y/o male s/p L TKA. PMH including but not limited to HTN, A-fib, obesity, gout, hx of MI in 2005 and previous R TKA (date unknown).     PT Comments    Pt presented sitting OOB in recliner when PT entered room. Pt making good progress towards achieving his functional goals. He participated in stair training during this session with wife present; however, would benefit from additional stair training with wife and son practicing with him. Pt would continue to benefit from skilled physical therapy services at this time while admitted and after d/c to address his limitations in order to improve his overall safety and independence with functional mobility.   Follow Up Recommendations  Home health PT;Supervision for mobility/OOB     Equipment Recommendations  None recommended by PT    Recommendations for Other Services       Precautions / Restrictions Precautions Precautions: Fall;Knee Required Braces or Orthoses: Knee Immobilizer - Left Knee Immobilizer - Left: On except when in CPM;Other (comment) (pt still unable to perform SLR x10) Restrictions Weight Bearing Restrictions: Yes LLE Weight Bearing: Weight bearing as tolerated    Mobility  Bed Mobility Overal bed mobility: Needs Assistance Bed Mobility: Sit to Supine       Sit to supine: Min guard   General bed mobility comments: pt able to perform sit to supine with HOB flat, no bed rails and min guard for safety  Transfers Overall transfer level: Needs assistance Equipment used: Rolling walker (2 wheeled) Transfers: Sit to/from Stand Sit to Stand: Mod assist         General transfer comment: pt required mod A to power up and achieve full standing position from recliner   Ambulation/Gait Ambulation/Gait assistance: Min guard Ambulation Distance (Feet): 40  Feet Assistive device: Rolling walker (2 wheeled) Gait Pattern/deviations: Step-to pattern;Step-through pattern;Decreased step length - right;Decreased stance time - left;Decreased weight shift to left Gait velocity: decreased Gait velocity interpretation: Below normal speed for age/gender General Gait Details: pt required VC'ing for sequencing with RW and postural cues   Stairs Stairs: Yes Stairs assistance: Min assist Stair Management: No rails;Step to pattern;Backwards;With walker Number of Stairs: 2 General stair comments: pt ascended with R LE leading and descended with L LE leading. pt required demonstration and frequent VC'ing. Min A provided to stabilize RW. Pt's wife was present during stair training and handout was provided.  Wheelchair Mobility    Modified Rankin (Stroke Patients Only)       Balance Overall balance assessment: Needs assistance Sitting-balance support: Feet supported;No upper extremity supported Sitting balance-Leahy Scale: Fair     Standing balance support: During functional activity;Bilateral upper extremity supported Standing balance-Leahy Scale: Poor                      Cognition Arousal/Alertness: Awake/alert Behavior During Therapy: WFL for tasks assessed/performed Overall Cognitive Status: Within Functional Limits for tasks assessed                      Exercises      General Comments        Pertinent Vitals/Pain Pain Assessment: Faces Faces Pain Scale: Hurts a little bit Pain Location: L knee Pain Descriptors / Indicators: Sore Pain Intervention(s): Monitored during session;Repositioned    Home Living  Prior Function            PT Goals (current goals can now be found in the care plan section) Acute Rehab PT Goals Patient Stated Goal: to get stronger PT Goal Formulation: With patient/family Time For Goal Achievement: 08/08/16 Potential to Achieve Goals: Good Progress  towards PT goals: Progressing toward goals    Frequency    7X/week      PT Plan Current plan remains appropriate    Co-evaluation             End of Session Equipment Utilized During Treatment: Gait belt;Left knee immobilizer Activity Tolerance: Patient limited by fatigue Patient left: in bed;with call bell/phone within reach;with family/visitor present     Time: 1445-1500 PT Time Calculation (min) (ACUTE ONLY): 15 min  Charges:  $Gait Training: 8-22 mins                    G CodesAlessandra Warner:      Evan Warner 08/03/2016, 5:22 PM Evan ChalkJennifer Lavaughn Warner, PT, DPT [phone]

## 2016-08-03 NOTE — Progress Notes (Signed)
Subjective: 3 Days Post-Op Procedure(s) (LRB): LEFT TOTAL KNEE ARTHROPLASTY (Left) Patient reports pain as mild and moderate.    Objective: Vital signs in last 24 hours: Temp:  [98 F (36.7 C)-98.7 F (37.1 C)] 98.2 F (36.8 C) (09/23 0540) Pulse Rate:  [71-85] 85 (09/23 0540) Resp:  [16] 16 (09/23 0540) BP: (84-117)/(47-72) 117/72 (09/23 0540) SpO2:  [90 %-98 %] 98 % (09/23 0540)  Intake/Output from previous day: 09/22 0701 - 09/23 0700 In: 720 [P.O.:720] Out: 600 [Urine:600] Intake/Output this shift: No intake/output data recorded.   Recent Labs  08/01/16 0636 08/02/16 0454 08/03/16 0535  HGB 12.1* 10.8* 10.7*    Recent Labs  08/02/16 0454 08/03/16 0535  WBC 9.4 8.6  RBC 3.57* 3.56*  HCT 33.5* 33.4*  PLT 184 214    Recent Labs  08/01/16 0636  NA 138  K 3.6  CL 101  CO2 28  BUN 11  CREATININE 1.17  GLUCOSE 155*  CALCIUM 8.4*   No results for input(s): LABPT, INR in the last 72 hours.  Neurologically intact  Assessment/Plan: 3 Days Post-Op Procedure(s) (LRB): LEFT TOTAL KNEE ARTHROPLASTY (Left) Up with therapy  Moving slowly. Will continue therapy with plan for possible home with HHPT.   Evan MangesMark C Ignace Warner 08/03/2016, 9:49 AM

## 2016-08-04 NOTE — Progress Notes (Signed)
Occupational Therapy Treatment Patient Details Name: Evan Warner MRN: [id number] DOB: [birth year] Today's Date: 08/04/2016    History of present illness Pt is a 65 y/o male s/p L TKA. PMH including but not limited to HTN, A-fib, obesity, gout, hx of MI in 2005 and previous R TKA (date unknown).    OT comments  Pt. Able to complete toileting and shower stall transfers at S level of A.  Reports wife able to assist with LB ADLS. Prn.  Pain is well managed and pt. Is eager for d/c home today.  OTR/l to sign off and adjust d/c recommendation per pt. And MD request.    Follow Up Recommendations  Supervision/Assistance - 24 hour    Equipment Recommendations       Recommendations for Other Services      Precautions / Restrictions Precautions Precaution Comments: pt. declined [place] of KI, states "i dont use it, just let me show you how im doing" Required Braces or Orthoses: Knee Immobilizer - Left Knee Immobilizer - Left: On except when in CPM;Other (comment) Restrictions LLE Weight Bearing: Weight bearing as tolerated       Mobility Bed Mobility               General bed mobility comments: Pt. seated in recliner upon arrival into room  Transfers Overall transfer level: Needs assistance Equipment used: Rolling walker (2 wheeled) Transfers: Sit to/from [employer] Transfers Sit to Stand: Supervision Stand pivot transfers: Supervision            Balance                                   ADL Overall ADL's : Needs assistance/impaired     Grooming: Standing;Supervision/safety Grooming Details (indicate cue type and reason): simulated during toileting tasks in b.room this am       Lower Body Bathing Details (indicate cue type and reason): pt. states A/E not needed       Lower Body Dressing Details (indicate cue type and reason): pt. states A/E not needed, wife will be assisting Toilet Transfer: Supervision/safety;Grab bars;RW;Comfort height  toilet;Ambulation Toilet Transfer Details (indicate cue type and reason): pt. has grab bars at home and does not require 3n1 over the commode Toileting- Clothing Manipulation and Hygiene: Supervision/safety;Sit to/from stand   Tub/ Shower Transfer: Walk-in shower;Supervision/safety;Ambulation;Anterior/posterior;Rolling walker Tub/Shower Transfer Details (indicate cue type and reason): pt. able to return demo for shower stall transfer without any inst. cues or physical assist Functional mobility during ADLs: Supervision/safety;Rolling walker General ADL Comments: pt. moving great! declined use of KI during session, states "dont be mad but i dont use it when peopel arent around".  not buckling or LOB noted.  able to perform toileting and shower stall transfers at S level.  eager for d/c home today.  wife able to assist with LB ADLS.       Vision                     Perception     Praxis      Cognition   Behavior During Therapy: [facility] for tasks assessed/performed Overall Cognitive Status: Within Functional Limits for tasks assessed                       Extremity/Trunk Assessment               Exercises  Shoulder Instructions       General Comments      Pertinent Vitals/ Pain       Pain Assessment: No/denies pain  Home Living                                          Prior Functioning/Environment              Frequency  Min 2X/week        Progress Toward Goals  OT Goals(current goals can now be found in the care plan section)  Progress towards OT goals: Goals met/education completed, patient discharged from [place] Discharge plan needs to be updated    Co-evaluation                 End of Session Equipment Utilized During Treatment: Gait belt;Rolling walker CPM Left Knee CPM Left Knee: On Left Knee Flexion (Degrees): 65 Left Knee Extension (Degrees): 0   Activity Tolerance Patient tolerated  treatment well   Patient Left in chair;with call bell/phone within reach   Nurse Communication          Time: 6283-6629 OT Time Calculation (min): 14 min  Charges: OT General Charges $OT Visit: 1 Procedure OT Treatments $Self Care/Home Management : 8-22 mins  Janice Coffin, COTA/L 08/04/2016, 8:59 AM

## 2016-08-04 NOTE — Progress Notes (Signed)
Discharge instructions and prescriptions provided to patient and wife.  Dressing changed per order.  Patient declined pain medication at the time of discharge.  Patient without questions.

## 2016-08-04 NOTE — Progress Notes (Signed)
Physical Therapy Treatment Patient Details Name: Evan Warner MRN: [id number] DOB: Sep 17, 1951 Today's Date: 08/04/2016    History of Present Illness Pt is a 65 y/o male s/p L TKA. PMH including but not limited to HTN, A-fib, obesity, gout, hx of MI in 2005 and previous R TKA (date unknown).     PT Comments    Pts wife present.  Pt safe with mobilty.  Pt did 2 steps safely.  Pt educated on HEP.  Pt and wife feel pt ready for DC.  They feel pt doing and feeling so much better than he did at this time on his last TKR.  All education complete and pt ready for DC.  Nursing notified  Follow Up Recommendations  Home health PT;Supervision for mobility/OOB     Equipment Recommendations       Recommendations for Other Services       Precautions / Restrictions Precautions Precautions: Fall;Knee Restrictions LLE Weight Bearing: Weight bearing as tolerated    Mobility  Bed Mobility Overal bed mobility: Modified Independent Bed Mobility: Supine to Sit     Supine to sit: Supervision (pt used rail to help him up.  showed wife how to positon pts RW to act as rail if needed at home)        Transfers Overall transfer level: Modified independent Equipment used: Rolling walker (2 wheeled) Transfers: Sit to/from Stand Sit to Stand: Supervision         General transfer comment: pt stood easily from bed - no help needed  Ambulation/Gait Ambulation/Gait assistance: Supervision Ambulation Distance (Feet): 80 Feet Assistive device: Rolling walker (2 wheeled)       General Gait Details: pt still wanting to do step to technque.  I had him verbalize what he should be working on and he could.  then he was more step through gait pattern.  pt cued not to lean so heavily on RW as he progresses   Stairs   Stairs assistance: Min assist Stair Management: No rails;Backwards;With walker Number of Stairs: 2 General stair comments: I had wife help pt with up and down 2 steps. pt was abel  to cue wife as to what he needed her to do.  I emphasized safety aspects wtih up and down steps  Wheelchair Mobility    Modified Rankin (Stroke Patients Only)       Balance                                    Cognition Arousal/Alertness: Awake/alert Behavior During Therapy: WFL for tasks assessed/performed Overall Cognitive Status: Within Functional Limits for tasks assessed                      Exercises Total Joint Exercises Straight Leg Raises: AROM;10 reps (pt cued on keeping leg straight - he was abel to lock and lift this afternoon while supine) Goniometric ROM: pt planning to leave today.  I reviewed the HEP and made sure pt had copy to go home.  I had pt in sitting do long arc quad to hamstring curls - x 5 reps with pt working to get as far as he can in both directions.  doing very well.    General Comments        Pertinent Vitals/Pain Pain Assessment: 0-10 Pain Score: 4  Pain Location: left knee Pain Intervention(s): Monitored during session;Repositioned;Relaxation    Home Living  Prior Function            PT Goals (current goals can now be found in the care plan section) Progress towards PT goals: Goals met/education completed, patient discharged from PT    Frequency           PT Plan Current plan remains appropriate (pt excited about DC today.  Wife in agreement)    Co-evaluation             End of Session Equipment Utilized During Treatment: Gait belt (pt no longer needing KI) Activity Tolerance: Patient tolerated treatment well Patient left: in chair;with call bell/phone within reach;with family/visitor present     Time: 1415-1435 PT Time Calculation (min) (ACUTE ONLY): 20 min  Charges:  $Gait Training: 8-22 mins                    G Codes:      Loyal Buba 08/04/2016, 5:24 PM 08/04/2016   Rande Lawman, PT

## 2016-08-04 NOTE — Plan of Care (Signed)
Problem: Acute Rehab OT Goals (only OT should resolve) Goal: Pt. Will Perform Lower Body Dressing Outcome: Not Applicable Date Met: 95/28/41 Pt. Reports wife will assist with LB ADLS

## 2016-08-04 NOTE — Progress Notes (Signed)
Orthopedic Tech Progress Note Patient Details:  Evan LabellaRoger D Warner 07/01/1951 [id number]  Patient ID: Evan Labellaoger D Warner, male   DOB: 05/15/1951, 65 y.o.   MRN: [id number] Applied cpm 0-65  Evan Warner, Evan Warner 08/04/2016, 6:04 AM

## 2016-08-04 NOTE — Progress Notes (Signed)
Subjective: 4 Days Post-Op Procedure(s) (LRB): LEFT TOTAL KNEE ARTHROPLASTY (Left) Patient reports pain as 3 on 0-10 scale.    Objective: Vital signs in last 24 hours: Temp:  [97.9 F (36.6 C)-98.2 F (36.8 C)] 98.2 F (36.8 C) (09/24 0612) Pulse Rate:  [65-76] 75 (09/24 0612) Resp:  [16] 16 (09/24 0612) BP: (105-113)/(46-69) 113/69 (09/24 0612) SpO2:  [93 %-96 %] 95 % (09/24 0612)  Intake/Output from previous day: 09/23 0701 - 09/24 0700 In: -  Out: 600 [Urine:600] Intake/Output this shift: No intake/output data recorded.   Recent Labs  08/02/16 0454 08/03/16 0535  HGB 10.8* 10.7*    Recent Labs  08/02/16 0454 08/03/16 0535  WBC 9.4 8.6  RBC 3.57* 3.56*  HCT 33.5* 33.4*  PLT 184 214   No results for input(s): NA, K, CL, CO2, BUN, CREATININE, GLUCOSE, CALCIUM in the last 72 hours. No results for input(s): LABPT, INR in the last 72 hours.  Neurologically intact  Assessment/Plan: 4 Days Post-Op Procedure(s) (LRB): LEFT TOTAL KNEE ARTHROPLASTY (Left) Discharge home with home health  Eldred MangesMark C Keeghan Mcintire 08/04/2016, 10:01 AM

## 2016-08-04 NOTE — Progress Notes (Signed)
Physical Therapy Treatment Patient Details Name: Evan Warner MRN: [id number] DOB: 1951/10/31 Today's Date: 08/04/2016    History of Present Illness Pt is a 65 y/o male s/p L TKA. PMH including but not limited to HTN, A-fib, obesity, gout, hx of MI in 2005 and previous R TKA (date unknown).     PT Comments    Pt progressing nicely.  He was anticipating DC today.   Wife and son coming in later today and will practice steps and make decision on when pt safe to DC.  Dr Ophelia Charter agreeable to plan.  Follow Up Recommendations  Home health PT;Supervision for mobility/OOB     Equipment Recommendations  None recommended by PT    Recommendations for Other Services       Precautions / Restrictions Precautions Precautions: Knee;Fall Precaution Comments: pt. declined [place] of KI, states "i dont use it, just let me show you how im doing" Required Braces or Orthoses: Knee Immobilizer - Left Knee Immobilizer - Left: On when out of bed or walking;Discontinue once straight leg raise with < 10 degree lag Restrictions LLE Weight Bearing: Weight bearing as tolerated    Mobility  Bed Mobility               General bed mobility comments: Pt. seated in recliner upon arrival into room  Transfers Overall transfer level: Needs assistance Equipment used: Rolling walker (2 wheeled) Transfers: Sit to/from Stand Sit to Stand: Supervision Stand pivot transfers: Supervision       General transfer comment: pt able to stand from recliner with min guard only today  Ambulation/Gait Ambulation/Gait assistance: Min guard Ambulation Distance (Feet): 100 Feet Assistive device: Rolling walker (2 wheeled) Gait Pattern/deviations: Decreased stance time - left;Step-to pattern;Trunk flexed     General Gait Details: Worked with pt on step through gait technque - getting pt to get a more natural stride.  pt then cued to try to look up and not lean so heavily on RW for support   Stairs             Wheelchair Mobility    Modified Rankin (Stroke Patients Only)       Balance                                    Cognition Arousal/Alertness: Awake/alert Behavior During Therapy: WFL for tasks assessed/performed Overall Cognitive Status: Within Functional Limits for tasks assessed                      Exercises Total Joint Exercises Quad Sets: Left;10 reps (with foot sitting on chair in front of pt) Straight Leg Raises: AROM;Left (pt did 2 set of 5 from reclined positon in recliner) Long Arc Quad: AROM;Left;10 reps Knee Flexion: AROM;Left;10 reps (pt then did over pressure x 3 reps with other foot to get stretch) Goniometric ROM: left knee flxion 67 degrees and extension 10 degrees from full extension -measured in sitting    General Comments        Pertinent Vitals/Pain Pain Assessment: 0-10 Pain Score: 5  Pain Location: left knee Pain Intervention(s): Monitored during session;Repositioned;Relaxation    Home Living                      Prior Function            PT Goals (current goals can now be found in the care  plan section) Progress towards PT goals: Progressing toward goals    Frequency    7X/week      PT Plan Current plan remains appropriate    Co-evaluation             End of Session Equipment Utilized During Treatment: Gait belt;Left knee immobilizer Activity Tolerance: Patient limited by fatigue Patient left: in bed;with call bell/phone within reach     Time: 0945-1010 PT Time Calculation (min) (ACUTE ONLY): 25 min  Charges:  $Gait Training: 8-22 mins $Therapeutic Exercise: 8-22 mins                    G Codes:      Judson RochHildreth, Melquiades Kovar Gardner 08/04/2016, 12:47 PM 08/04/2016   Ranae PalmsElizabeth Sybel Standish, PT

## 2016-08-05 NOTE — Care Management (Signed)
Case manager spoke with patient's [facility] social Worker- Academic librarianQuona Mock concerning need for Home Health rather than SNF placement. Orders for Home Health Faxed to patient's [facility] MD- Dr. Ena DawleySekhon @ [phone]. Referral called to Clydie BraunKaren, [facility],  with approval of Ms. Mock

## 2016-08-13 ENCOUNTER — Inpatient Hospital Stay (INDEPENDENT_AMBULATORY_CARE_PROVIDER_SITE_OTHER): Payer: No Typology Code available for payment source | Admitting: Orthopaedic Surgery

## 2016-08-13 ENCOUNTER — Encounter (HOSPITAL_COMMUNITY): Payer: Self-pay | Admitting: Orthopaedic Surgery

## 2016-08-13 DIAGNOSIS — M1712 Unilateral primary osteoarthritis, left knee: Secondary | ICD-10-CM | POA: Diagnosis not present

## 2016-08-20 ENCOUNTER — Ambulatory Visit (HOSPITAL_COMMUNITY): Payer: No Typology Code available for payment source | Attending: Orthopaedic Surgery | Admitting: Physical Therapy

## 2016-08-20 DIAGNOSIS — R600 Localized edema: Secondary | ICD-10-CM | POA: Diagnosis present

## 2016-08-20 DIAGNOSIS — M25662 Stiffness of left knee, not elsewhere classified: Secondary | ICD-10-CM | POA: Diagnosis present

## 2016-08-20 DIAGNOSIS — R262 Difficulty in walking, not elsewhere classified: Secondary | ICD-10-CM | POA: Diagnosis present

## 2016-08-20 DIAGNOSIS — R2681 Unsteadiness on feet: Secondary | ICD-10-CM | POA: Insufficient documentation

## 2016-08-20 NOTE — Therapy (Signed)
[facility] [address] [place], [place], [address] Phone: [phone]   Fax:  [phone]  Physical Therapy Evaluation  Patient Details  Name: Evan Warner MRN: [id number] Date of Birth: 1950-12-23 Referring Provider: Annell Greening   Encounter Date: 08/20/2016      PT End of Session - 08/20/16 1752    Visit Number 1   Number of Visits 18   Date for PT Re-Evaluation 09/10/16   Authorization Type [facility] Choice (this insurance needs G-codes)   Authorization Time Period 08/20/16 to 10/01/16   Authorization - Visit Number 1   Authorization - Number of Visits 10   PT Start Time 1432   PT Stop Time 1511   PT Time Calculation (min) 39 min   Activity Tolerance Patient tolerated treatment well   Behavior During Therapy [facility] for tasks assessed/performed      Past Medical History:  Diagnosis Date  . A-fib (HCC)    chronic afib history per 02/20/16 [facility] cardioloy note  . Arthritis   . Coronary atherosclerosis of native coronary artery    DES ramus 2005, LVEF 55%.Takes Pradaxa daily  . Enlarged prostate    takes Flomax nightly  . Essential hypertension, benign    takes Atenolol,Lisinipril-HCTZ,and Procardia daily  . GERD (gastroesophageal reflux disease)    takes Pantoprazole daily  . Gout    takes Allopurinol daily  . History of kidney stones   . Hyperlipidemia    takes Simvastatin daily  . Joint pain   . Joint swelling   . MI (myocardial infarction)    [facility]  . Mood disorder (HCC)    takes Fluoxetine daily  . Nocturia   . OSA (obstructive sleep apnea)    On CPAP  . Peripheral edema    occasionally  . Pneumonia    history of   . Urinary frequency     Past Surgical History:  Procedure Laterality Date  . ANKLE ARTHROSCOPY     Left  . BACK SURGERY    . CARDIAC CATHETERIZATION  2005  . CORONARY ANGIOPLASTY     1 stent  . skin tags removed    . TOTAL KNEE ARTHROPLASTY     Right  . TOTAL KNEE  ARTHROPLASTY Left 07/31/2016   Procedure: LEFT TOTAL KNEE ARTHROPLASTY;  Surgeon: Eldred Manges, MD;  Location: [facility] OR;  Service: Orthopedics;  Laterality: Left;    There were no vitals filed for this visit.       Subjective Assessment - 08/20/16 1439    Subjective Patient had his L knee replaced in September (9/20); he had HHPT after surgery and has since been discharged. Things have been going very well overall, it is hard for him to squat and get enough knee flexion to do certain tasks- he thinks he can get to around 80 degrees but would struggle to get to 90. Stairs are going well as long as he has a rail, he has not tried walking over grass/gravel yet. Most of his walking is on level surfaces right now. His balance has been pretty good in general, no falls or close calls. His wife has no additional concerns or comments today.    Pertinent History on beta-blockers, nitro as needed, HTN, OSA, A-fib medically managed, hx gout, hx MI, mood disorder, L ankle surgery, R knee replacement    How long can you sit comfortably? 2 hours    How long can you stand comfortably? good while with assistive  device; maybe 30 minutes without device    How long can you walk comfortably? maybe a block with device    Patient Stated Goals get back to PLOF    Currently in Pain? No/denies  at worst, 2/10            [facility] PT Assessment - 08/20/16 0001      Assessment   Medical Diagnosis L total knee replacement    Referring Provider Annell Greening    Onset Date/Surgical Date 07/31/16   Next MD Visit Dr. Ophelia Charter early November      Precautions   Precaution Comments fall, L total knee      Balance Screen   Has the patient fallen in the past 6 months No   Has the patient had a decrease in activity level because of a fear of falling?  No   Is the patient reluctant to leave their home because of a fear of falling?  No     Prior Function   Level of Independence Independent;Independent with basic ADLs;Independent  with gait;Independent with transfers   Vocation Retired     Observation/Other Assessments   Focus on Therapeutic Outcomes (FOTO)  56% limited      AROM   Left Knee Extension 12   Left Knee Flexion 82     Strength   Right Hip Flexion 4/5   Right Hip ABduction 4+/5   Left Hip Flexion 4+/5   Left Hip ABduction 4+/5   Right Knee Flexion 5/5   Right Knee Extension 5/5   Left Knee Flexion 4/5   Left Knee Extension 5/5   Right Ankle Dorsiflexion 5/5   Left Ankle Dorsiflexion 5/5     Ambulation/Gait   Gait Comments reduced L knee TKE, flexed at hips, reduced L Knee flexion      6 minute walk test results    Aerobic Endurance Distance Walked 577   Endurance additional comments , walker      High Level Balance   High Level Balance Comments 2-3 seconds B SLS                            PT Education - 08/20/16 1752    Education provided Yes   Education Details prognosis, HEP, POC; discussed focus of HEP (quad sets and knee flexion stretch) which patient says he already has on handouts at home   Person(s) Educated Patient;Spouse   Methods Explanation;Demonstration   Comprehension Verbalized understanding;Returned demonstration;Need further instruction          PT Short Term Goals - 08/20/16 1759      PT SHORT TERM GOAL #1   Title Patient to demonstrate ROM L knee of 0-115 degrees in order to improve gait mechanics and general functional task performance    Time 3   Period Weeks   Status New     PT SHORT TERM GOAL #2   Title Patient will be able to ambulate unlimited distances with pain L knee no more than 1/10 in order to improve general mobility and community access    Time 3   Period Weeks   Status New     PT SHORT TERM GOAL #3   Title Patient to be independent in management strategies for correct icing technique, patella/scar massage, and retrograde massage in order to assist with progression of status and improve self-efficacy in managing  condition    Time 3   Period Weeks   Status  New     PT SHORT TERM GOAL #4   Title Patient to be independent in correctly and consistently performing appropriate HEP, to be updated PRN    Time 1   Period Weeks   Status New           PT Long Term Goals - 08/20/16 1803      PT LONG TERM GOAL #1   Title Patient to demonstrate strength 5/5 in order to improve functional task performance and reduce pain with extended activities    Time 6   Period Weeks   Status New     PT LONG TERM GOAL #2   Title Patient to be able to ambulate 65850ft during 3MWT to demonstrate improved mobility and safe community access    Time 6   Period Weeks   Status New     PT LONG TERM GOAL #3   Title Patient to be able to maintain SLS for 30 seconds on each LE in order to show reduced fall risk and improved balance skills    Time 6   Period Weeks   Status New     PT LONG TERM GOAL #4   Title Patient to be able to reciprocally ascend/descend full flight of stairs with U railing, minimal unsteadiness, good eccentric control in order to improve home and community access skills    Time 6   Period Weeks   Status New               Plan - 08/20/16 1753    Clinical Impression Statement Patient arrives after total knee replacement surgery which was performed on 07/31/16; he states that he has been doing rather well but is concerned about the amount of edema/ROM restriction in his knee at this point. He considers himself a "risk-taker" and is really interested in pushing himself as much as possible. Upon examination, noted significant L knee stiffness with localized edema, some functional muscle weakness, gait deviations and impairment, and unsteadiness as noted in SLS based positions. Patient's incision appears to be well healing and without signs of infection, although majority of incision continues to be covered by steri-strips- therapy staff will continue to monitor. He will benefit from skilled PT  services in order to address identified functional limitations and assist in reaching optimal level of function.    Rehab Potential Excellent   Clinical Impairments Affecting Rehab Potential good motivation to participate in skilled PT services    PT Frequency 3x / week   PT Duration 6 weeks   PT Treatment/Interventions ADLs/Self Care Home Management;Biofeedback;Cryotherapy;Moist Heat;DME Instruction;Gait training;Stair training;Functional mobility training;Therapeutic activities;Therapeutic exercise;Balance training;Neuromuscular re-education;Patient/family education;Manual techniques;Scar mobilization;Passive range of motion;Energy conservation;Taping   PT Next Visit Plan review HEP and goals; focus on ROM and edema control, then progress to strengthening as ROM normalizes. Balance and gait throughout sessions as well. Monitor healing status of incision.    PT Home Exercise Plan 10/10: quad sets and knee flexion stretch (patient refused handout as he already has these)   Recommended Other Services possible compression stocking       Patient will benefit from skilled therapeutic intervention in order to improve the following deficits and impairments:  Abnormal gait, Decreased skin integrity, Improper body mechanics, Pain, Decreased coordination, Decreased mobility, Decreased scar mobility, Increased muscle spasms, Postural dysfunction, Decreased range of motion, Decreased strength, Hypomobility, Decreased balance, Difficulty walking, Increased edema, Impaired flexibility  Visit Diagnosis: Stiffness of left knee, not elsewhere classified - Plan: PT plan of care cert/re-cert  Localized edema - Plan: PT plan of care cert/re-cert  Difficulty in walking, not elsewhere classified - Plan: PT plan of care cert/re-cert  Unsteadiness on feet - Plan: PT plan of care cert/re-cert      G-Codes - 27-Aug-2016 1807    Functional Assessment Tool Used Based on skilled clinical assessment of ROM, edema,  strength, gait, balance    Functional Limitation Mobility: Walking and moving around   Mobility: Walking and Moving Around Current Status ([id number]) At least 40 percent but less than 60 percent impaired, limited or restricted   Mobility: Walking and Moving Around Goal Status [phone]) At least 20 percent but less than 40 percent impaired, limited or restricted       Problem List Patient Active Problem List   Diagnosis Date Noted  . 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 OSA (obstructive sleep apnea) 01/23/2012  . ESSENTIAL HYPERTENSION, BENIGN 01/03/2010  . CORONARY ATHEROSCLEROSIS NATIVE CORONARY ARTERY 01/03/2010  . HYPERLIPIDEMIA-MIXED 05/08/2009  . OBESITY-MORBID (>100') 05/08/2009    Nedra Hai PT, DPT [phone]  [facility] [address] [place], [place], [address] Phone: [phone]   Fax:  [phone]  Name: LORAIN KEAST MRN: [id number] Date of Birth: 04/29/1951

## 2016-08-21 ENCOUNTER — Ambulatory Visit (HOSPITAL_COMMUNITY): Payer: No Typology Code available for payment source

## 2016-08-21 DIAGNOSIS — R2681 Unsteadiness on feet: Secondary | ICD-10-CM

## 2016-08-21 DIAGNOSIS — M25662 Stiffness of left knee, not elsewhere classified: Secondary | ICD-10-CM

## 2016-08-21 DIAGNOSIS — R262 Difficulty in walking, not elsewhere classified: Secondary | ICD-10-CM

## 2016-08-21 DIAGNOSIS — R600 Localized edema: Secondary | ICD-10-CM

## 2016-08-21 NOTE — Therapy (Signed)
[place] [facility] [address] [place], [place], [address] Phone: [phone]   Fax:  [phone]  Physical Therapy Treatment  Patient Details  Name: Evan LabellaRoger D Warner MRN: [id number] Date of Birth: 12/27/1950 Referring Provider: Annell GreeningMark Yates   Encounter Date: 08/21/2016      PT End of Session - 08/21/16 0956    Visit Number 2   Number of Visits 48   Date for PT Re-Evaluation 09/10/16   Authorization Type [employer] Choice (this insurance needs G-codes)   Authorization Time Period 08/20/16 to 10/01/16   Authorization - Visit Number 2   Authorization - Number of Visits 10   PT Start Time 0948   PT Stop Time 1028   PT Time Calculation (min) 40 min      Past Medical History:  Diagnosis Date  . A-fib (HCC)    chronic afib history per 02/20/16 [facility] note  . Arthritis   . Coronary atherosclerosis of native coronary artery    DES ramus 2005, LVEF 55%.Takes Pradaxa daily  . Enlarged prostate    takes Flomax nightly  . Essential hypertension, benign    takes Atenolol,Lisinipril-HCTZ,and Procardia daily  . GERD (gastroesophageal reflux disease)    takes Pantoprazole daily  . Gout    takes Allopurinol daily  . History of kidney stones   . Hyperlipidemia    takes Simvastatin daily  . Joint pain   . Joint swelling   . [facility]  . Mood disorder (HCC)    takes Fluoxetine daily  . Nocturia   . OSA (obstructive sleep apnea)    On CPAP  . Peripheral edema    occasionally  . Pneumonia    history of   . Urinary frequency     Past Surgical History:  Procedure Laterality Date  . ANKLE ARTHROSCOPY     Left  . BACK SURGERY    . CARDIAC CATHETERIZATION  2005  . CORONARY ANGIOPLASTY     1 stent  . skin tags removed    . TOTAL KNEE ARTHROPLASTY     Right  . TOTAL KNEE ARTHROPLASTY Left 07/31/2016   Procedure: LEFT TOTAL KNEE ARTHROPLASTY;  Surgeon: Eldred MangesMark C Yates, MD;  Location: [facility] OR;   Service: Orthopedics;  Laterality: Left;    There were no vitals filed for this visit.      Subjective Assessment - 08/21/16 0952    Subjective Pt stated he has minmal pain today, 2/10 dull soreness.  Reports compliance with HEP without questions.   Pertinent History on beta-blockers, nitro as needed, HTN, OSA, A-fib medically managed, hx gout, hx MI, mood disorder, L ankle surgery, R knee replacement    Patient Stated Goals get back to PLOF    Currently in Pain? Yes   Pain Score 2    Pain Location Knee   Pain Orientation Left   Pain Descriptors / Indicators Dull;Sore   Pain Type Surgical pain   Pain Onset 1 to 4 weeks ago   Pain Frequency Intermittent   Aggravating Factors  bending knee   Pain Relieving Factors RICE, movements, get up and walk   Effect of Pain on Daily Activities decreased ADLs                         [facility] Adult PT Treatment/Exercise - 08/21/16 0001      Ambulation/Gait   Gait Comments reduced L knee TKE, flexed at  hips, reduced L Knee flexion      Exercises   Exercises Knee/Hip     Knee/Hip Exercises: Stretches   Active Hamstring Stretch 3 reps;30 seconds   Active Hamstring Stretch Limitations supine wiht rope     Knee/Hip Exercises: Supine   Quad Sets 10 reps   Short Arc Quad Sets 15 reps   Heel Slides 15 reps   Knee Flexion AAROM   Knee Flexion Limitations 8-88 degrees     Manual Therapy   Manual Therapy Edema management   Manual therapy comments Manual techniques complete separate rest of tx   Edema Management Retro massage with LE elevated                PT Education - 08/21/16 1035    Education provided Yes   Education Details Reviewed goals, compliance and assured correct technqiue with HEP, copy of eval given to pt.     Person(s) Educated Patient   Methods Explanation;Demonstration;Handout   Comprehension Verbalized understanding;Returned demonstration          PT Short Term Goals - 08/20/16 1759       PT SHORT TERM GOAL #1   Title Patient to demonstrate ROM L knee of 0-115 degrees in order to improve gait mechanics and general functional task performance    Time 3   Period Weeks   Status New     PT SHORT TERM GOAL #2   Title Patient will be able to ambulate unlimited distances with pain L knee no more than 1/10 in order to improve general mobility and community access    Time 3   Period Weeks   Status New     PT SHORT TERM GOAL #3   Title Patient to be independent in management strategies for correct icing technique, patella/scar massage, and retrograde massage in order to assist with progression of status and improve self-efficacy in managing condition    Time 3   Period Weeks   Status New     PT SHORT TERM GOAL #4   Title Patient to be independent in correctly and consistently performing appropriate HEP, to be updated PRN    Time 1   Period Weeks   Status New           PT Long Term Goals - 08/20/16 1803      PT LONG TERM GOAL #1   Title Patient to demonstrate strength 5/5 in order to improve functional task performance and reduce pain with extended activities    Time 6   Period Weeks   Status New     PT LONG TERM GOAL #2   Title Patient to be able to ambulate 644ft during to demonstrate improved mobility and safe community access    Time 6   Period Weeks   Status New     PT LONG TERM GOAL #3   Title Patient to be able to maintain SLS for 30 seconds on each LE in order to show reduced fall risk and improved balance skills    Time 6   Period Weeks   Status New     PT LONG TERM GOAL #4   Title Patient to be able to reciprocally ascend/descend full flight of stairs with U railing, minimal unsteadiness, good eccentric control in order to improve home and community access skills    Time 6   Period Weeks   Status New  Plan - 08/21/16 1005    Clinical Impression Statement Reviewed goals, compliance and assured correct technique with HEP  and copy of eval given.  Knee continues to have steristrips with scab on incision, no heat or redness or other signs of infection present.  Session focus on improving knee mobilty with good form and minimal cueing required.   Retro massage complete for edema control with improved AROM to 8-88 degrees at EOS.  No reports of pain, pt was encouraged to apply ice once home for edema and pain control.     Rehab Potential Excellent   Clinical Impairments Affecting Rehab Potential good motivation to participate in skilled PT services    PT Frequency 3x / week   PT Duration 6 weeks   PT Treatment/Interventions ADLs/Self Care Home Management;Biofeedback;Cryotherapy;Moist Heat;DME Instruction;Gait training;Stair training;Functional mobility training;Therapeutic activities;Therapeutic exercise;Balance training;Neuromuscular re-education;Patient/family education;Manual techniques;Scar mobilization;Passive range of motion;Energy conservation;Taping   PT Next Visit Plan Session focus on ROM and edema control, then progress to strengthening as ROM normalizes. Balance and gait throughout sessions as well. Monitor healing status of incision.    PT Home Exercise Plan 10/10: quad sets and knee flexion stretch (patient refused handout as he already has these)      Patient will benefit from skilled therapeutic intervention in order to improve the following deficits and impairments:  Abnormal gait, Decreased skin integrity, Improper body mechanics, Pain, Decreased coordination, Decreased mobility, Decreased scar mobility, Increased muscle spasms, Postural dysfunction, Decreased range of motion, Decreased strength, Hypomobility, Decreased balance, Difficulty walking, Increased edema, Impaired flexibility  Visit Diagnosis: Stiffness of left knee, not elsewhere classified  Localized edema  Difficulty in walking, not elsewhere classified  Unsteadiness on feet    Problem List Patient Active Problem List   Diagnosis  Date Noted  . 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 OSA (obstructive sleep apnea) 01/23/2012  . ESSENTIAL HYPERTENSION, BENIGN 01/03/2010  . CORONARY ATHEROSCLEROSIS NATIVE CORONARY ARTERY 01/03/2010  . HYPERLIPIDEMIA-MIXED 05/08/2009  . OBESITY-MORBID (>100') 05/08/2009   Becky Sax, LPTA; CBIS [phone]  Juel Burrow 08/21/2016, 10:35 AM  [place] [facility] [address] [place], [place], [address] Phone: [phone]   Fax:  [phone]  Name: Evan Warner MRN: [id number] Date of Birth: [birth year]

## 2016-08-23 ENCOUNTER — Ambulatory Visit (HOSPITAL_COMMUNITY): Payer: No Typology Code available for payment source

## 2016-08-23 DIAGNOSIS — R600 Localized edema: Secondary | ICD-10-CM

## 2016-08-23 DIAGNOSIS — M25662 Stiffness of left knee, not elsewhere classified: Secondary | ICD-10-CM

## 2016-08-23 DIAGNOSIS — R2681 Unsteadiness on feet: Secondary | ICD-10-CM

## 2016-08-23 DIAGNOSIS — R262 Difficulty in walking, not elsewhere classified: Secondary | ICD-10-CM

## 2016-08-23 NOTE — Therapy (Signed)
[place] [facility] [address] [place], [place], [address] Phone: [phone]   Fax:  [phone]  Physical Therapy Treatment  Patient Details  Name: Evan Warner MRN: [id number] Date of Birth: 12/14/1950 Referring Provider: Annell GreeningMark Yates   Encounter Date: 08/23/2016      PT End of Session - 08/23/16 1003    Visit Number 3   Number of Visits 18   Date for PT Re-Evaluation 09/10/16   Authorization Type [employer] Choice (this insurance needs G-codes)   Authorization Time Period 08/20/16 to 10/01/16   Authorization - Visit Number 3   Authorization - Number of Visits 10   PT Start Time 0950   PT Stop Time 1030   PT Time Calculation (min) 40 min   Equipment Utilized During Treatment Gait belt   Activity Tolerance Patient tolerated treatment well   Behavior During Therapy [facility] for tasks assessed/performed      Past Medical History:  Diagnosis Date  . A-fib (HCC)    chronic afib history per 02/20/16 [facility] note  . Arthritis   . Coronary atherosclerosis of native coronary artery    DES ramus 2005, LVEF 55%.Takes Pradaxa daily  . Enlarged prostate    takes Flomax nightly  . Essential hypertension, benign    takes Atenolol,Lisinipril-HCTZ,and Procardia daily  . GERD (gastroesophageal reflux disease)    takes Pantoprazole daily  . Gout    takes Allopurinol daily  . History of kidney stones   . Hyperlipidemia    takes Simvastatin daily  . Joint pain   . Joint swelling   . [place] [facility]  . Mood disorder (HCC)    takes Fluoxetine daily  . Nocturia   . OSA (obstructive sleep apnea)    On CPAP  . Peripheral edema    occasionally  . Pneumonia    history of   . Urinary frequency     Past Surgical History:  Procedure Laterality Date  . ANKLE ARTHROSCOPY     Left  . BACK SURGERY    . CARDIAC CATHETERIZATION  2005  . CORONARY ANGIOPLASTY     1 stent  . skin tags removed    .  TOTAL KNEE ARTHROPLASTY     Right  . TOTAL KNEE ARTHROPLASTY Left 07/31/2016   Procedure: LEFT TOTAL KNEE ARTHROPLASTY;  Surgeon: Eldred MangesMark C Yates, MD;  Location: [facility] OR;  Service: Orthopedics;  Laterality: Left;    There were no vitals filed for this visit.      Subjective Assessment - 08/23/16 1001    Subjective Pt stated knee is feeling good today, pain minimal today. 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     Pertinent History on beta-blockers, nitro as needed, HTN, OSA, A-fib medically managed, hx gout, hx MI, mood disorder, L ankle surgery, R knee replacement    Patient Stated Goals get back to PLOF    Currently in Pain? Yes   Pain Score 2    Pain Location Knee   Pain Orientation Left   Pain Descriptors / Indicators Sore   Pain Type Surgical pain   Pain Onset 1 to 4 weeks ago   Pain Frequency Intermittent   Aggravating Factors  bending knee   Pain Relieving Factors RICE, movements, get up and walk   Effect of Pain on Daily Activities Decreased ADLs  [facility] Adult PT Treatment/Exercise - 08/23/16 0001      Ambulation/Gait   Ambulation/Gait Yes   Ambulation/Gait Assistance 5: Supervision   Ambulation/Gait Assistance Details Cueing to increase stride length to improve knee extension with gait   Ambulation Distance (Feet) 125 Feet  125 with RW to increase stride length; 226 feet with SPC   Assistive device Rolling walker;Straight cane   Gait Pattern Step-through pattern   Ambulation Surface Level;Indoor   Gait Comments reduced L knee TKE, flexed at hips, reduced L Knee flexion      Knee/Hip Exercises: Stretches   Active Hamstring Stretch 3 reps;30 seconds   Active Hamstring Stretch Limitations supine wiht rope   Knee: Self-Stretch Limitations 10x 10" holds on 12in step   Gastroc Stretch 3 reps;30 seconds   Gastroc Stretch Limitations slant  board     Knee/Hip Exercises: Supine   Quad Sets 10 reps   Short Arc Quad Sets 15 reps   Heel Slides 15 reps   Heel Slides Limitations 5-91 degrees   Knee Flexion AAROM   Knee Flexion Limitations 5-91 degrees                  PT Short Term Goals - 08/20/16 1759      PT SHORT TERM GOAL #1   Title Patient to demonstrate ROM L knee of 0-115 degrees in order to improve gait mechanics and general functional task performance    Time 3   Period Weeks   Status New     PT SHORT TERM GOAL #2   Title Patient will be able to ambulate unlimited distances with pain L knee no more than 1/10 in order to improve general mobility and community access    Time 3   Period Weeks   Status New     PT SHORT TERM GOAL #3   Title Patient to be independent in management strategies for correct icing technique, patella/scar massage, and retrograde massage in order to assist with progression of status and improve self-efficacy in managing condition    Time 3   Period Weeks   Status New     PT SHORT TERM GOAL #4   Title Patient to be independent in correctly and consistently performing appropriate HEP, to be updated PRN    Time 1   Period Weeks   Status New           PT Long Term Goals - 08/20/16 1803      PT LONG TERM GOAL #1   Title Patient to demonstrate strength 5/5 in order to improve functional task performance and reduce pain with extended activities    Time 6   Period Weeks   Status New     PT LONG TERM GOAL #2   Title Patient to be able to ambulate 659ft during to demonstrate improved mobility and safe community access    Time 6   Period Weeks   Status New     PT LONG TERM GOAL #3   Title Patient to be able to maintain SLS for 30 seconds on each LE in order to show reduced fall risk and improved balance skills    Time 6   Period Weeks   Status New     PT LONG TERM GOAL #4   Title Patient to be able to reciprocally ascend/descend full flight of stairs with U  railing, minimal unsteadiness, good eccentric control in order to improve home and community access skills    Time  6   Period Weeks   Status New               Plan - 08/23/16 1035    Clinical Impression Statement Session focus on improving knee mobility and gait training with LRAD.  Pt progressing well towards goals with reports of compliance wiht HEP with good form through therex, reports of increased walking and minimal reoprts of pain or edema present at entrance today.  Added stretches to assist with knee mobility and additional stretches given as HEP.  Improved ROM at EOS to 5-91 degrees.  Gait training wiht supervision with SPC with good balance and mechanics noted, minimal cueing to increase stride length to improve knee extension with gait.  No reports of pain at EOS.     Rehab Potential Excellent   Clinical Impairments Affecting Rehab Potential good motivation to participate in skilled PT services    PT Frequency 3x / week   PT Duration 6 weeks   PT Treatment/Interventions ADLs/Self Care Home Management;Biofeedback;Cryotherapy;Moist Heat;DME Instruction;Gait training;Stair training;Functional mobility training;Therapeutic activities;Therapeutic exercise;Balance training;Neuromuscular re-education;Patient/family education;Manual techniques;Scar mobilization;Passive range of motion;Energy conservation;Taping   PT Next Visit Plan Session focus on ROM and edema control, then progress to strengthening as ROM normalizes. Balance and gait throughout sessions as well. Monitor healing status of incision.    PT Home Exercise Plan 10/10: quad sets and knee flexion stretch (patient refused handout as he already has these); 10/13:  addition of knee extension hang with light weight on knee and knee drives given.      Patient will benefit from skilled therapeutic intervention in order to improve the following deficits and impairments:  Abnormal gait, Decreased skin integrity, Improper body  mechanics, Pain, Decreased coordination, Decreased mobility, Decreased scar mobility, Increased muscle spasms, Postural dysfunction, Decreased range of motion, Decreased strength, Hypomobility, Decreased balance, Difficulty walking, Increased edema, Impaired flexibility  Visit Diagnosis: Stiffness of left knee, not elsewhere classified  Localized edema  Difficulty in walking, not elsewhere classified  Unsteadiness on feet     Problem List Patient Active Problem List   Diagnosis Date Noted  . 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 OSA (obstructive sleep apnea) 01/23/2012  . ESSENTIAL HYPERTENSION, BENIGN 01/03/2010  . CORONARY ATHEROSCLEROSIS NATIVE CORONARY ARTERY 01/03/2010  . HYPERLIPIDEMIA-MIXED 05/08/2009  . OBESITY-MORBID (>100') 05/08/2009   Becky Sax, LPTA; CBIS [phone]  Juel Burrow 08/23/2016, 10:43 AM   [facility] [address] [place], [place], [address] Phone: [phone]   Fax:  [phone]  Name: Evan Warner MRN: [id number] Date of Birth: 07/26/1951

## 2016-08-23 NOTE — Patient Instructions (Signed)
Discussed (no print out given): supine/seated position with heel supported and light weight on proximal knee for prolonged periods of time to assist with extension     With Left foot on mantle bend knee until you feel good stretch on front of knee.  Complete with 10 reps and 10 second holds to improve knee flexion.

## 2016-08-27 ENCOUNTER — Ambulatory Visit (HOSPITAL_COMMUNITY): Payer: No Typology Code available for payment source | Admitting: Physical Therapy

## 2016-08-27 DIAGNOSIS — R600 Localized edema: Secondary | ICD-10-CM

## 2016-08-27 DIAGNOSIS — R2681 Unsteadiness on feet: Secondary | ICD-10-CM

## 2016-08-27 DIAGNOSIS — M25662 Stiffness of left knee, not elsewhere classified: Secondary | ICD-10-CM | POA: Diagnosis not present

## 2016-08-27 DIAGNOSIS — R262 Difficulty in walking, not elsewhere classified: Secondary | ICD-10-CM

## 2016-08-27 NOTE — Therapy (Signed)
[facility] [address] [place], [place], [address] Phone: [phone]   Fax:  [phone]  Physical Therapy Treatment  Patient Details  Name: Evan Warner MRN: [id number] Date of Birth: 10/03/1951 Referring Provider: Annell GreeningMark Yates   Encounter Date: 08/27/2016      PT End of Session - 08/27/16 1205    Visit Number 4   Number of Visits 18   Date for PT Re-Evaluation 09/10/16   Authorization Type [employer] Choice (this insurance needs G-codes)   Authorization Time Period 08/20/16 to 10/01/16   Authorization - Visit Number 4   Authorization - Number of Visits 10   PT Start Time 1117   PT Stop Time 1155   PT Time Calculation (min) 38 min   Activity Tolerance Patient tolerated treatment well   Behavior [facility] Therapy [facility] for tasks assessed/performed      Past Medical History:  Diagnosis Date  . A-fib (HCC)    chronic afib history per 02/20/16 [facility]  . Arthritis   . Coronary atherosclerosis of native coronary artery    DES ramus 2005, LVEF 55%.Takes Pradaxa daily  . Enlarged prostate    takes Flomax nightly  . Essential hypertension, benign    takes Atenolol,Lisinipril-HCTZ,and Procardia daily  . GERD (gastroesophageal reflux disease)    takes Pantoprazole daily  . Gout    takes Allopurinol daily  . History of kidney stones   . Hyperlipidemia    takes Simvastatin daily  . Joint pain   . Joint swelling   . [facility]  . Mood disorder (HCC)    takes Fluoxetine daily  . Nocturia   . OSA (obstructive sleep apnea)    On CPAP  . Peripheral edema    occasionally  . Pneumonia    history of   . Urinary frequency     Past Surgical History:  Procedure Laterality Date  . ANKLE ARTHROSCOPY     Left  . BACK SURGERY    . CARDIAC CATHETERIZATION  2005  . CORONARY ANGIOPLASTY     1 stent  . skin tags removed    . TOTAL KNEE ARTHROPLASTY     Right  . TOTAL KNEE  ARTHROPLASTY Left 07/31/2016   Procedure: LEFT TOTAL KNEE ARTHROPLASTY;  Surgeon: Eldred MangesMark C Yates, MD;  Location: [facility] OR;  Service: Orthopedics;  Laterality: Left;    There were no vitals filed for this visit.      Subjective Assessment - 08/27/16 1118    Subjective Patient [place]; he felt OK but the main part where he felt stress was his quad rather than his knee joint. HIs steri-strips  have come off completely.    Pertinent History on beta-blockers, nitro as needed, HTN, OSA, A-fib medically managed, hx gout, hx MI, mood disorder, L ankle surgery, R knee replacement    Patient Stated Goals get back to PLOF    Currently in Pain? Yes   Pain Score 2    Pain Location Knee   Pain Orientation Left                         [facility] Adult PT Treatment/Exercise - 08/27/16 0001      Ambulation/Gait   Ambulation/Gait Yes   Ambulation/Gait Assistance 6: Modified independent (Device/Increase time)   Ambulation Distance (Feet) 452 Feet   Assistive device None   Gait  Pattern Step-through pattern   Ambulation Surface Level;Indoor   Gait Comments reduced L knee ROM     Knee/Hip Exercises: Stretches   Active Hamstring Stretch 3 reps;30 seconds   Active Hamstring Stretch Limitations 12 inch box    Knee: Self-Stretch Limitations 15x10 second holds on 12 inch box    Gastroc Stretch 3 reps;30 seconds   Gastroc Stretch Limitations slant board     Knee/Hip Exercises: Supine   Quad Sets 15 reps   Quad Sets Limitations 3 seconds    Knee Flexion AAROM   Knee Flexion Limitations x10 in prone    Other Supine Knee/Hip Exercises knee extension 1x10 AAROM in supine      Manual Therapy   Manual Therapy Joint mobilization;Soft tissue mobilization   Manual therapy comments Manual techniques complete separate rest of tx   Joint Mobilization patella mobs all directions    Soft tissue mobilization scar massage (avoiding scabs); distal L quad STM                  PT Education - 08/27/16 1205    Education provided Yes   Education Details hold off on vitamin E oil as there appears be some ongoing open areas under scabs    Person(s) Educated Patient   Methods Explanation   Comprehension Verbalized understanding          PT Short Term Goals - 08/20/16 1759      PT SHORT TERM GOAL #1   Title Patient to demonstrate ROM L knee of 0-115 degrees in order to improve gait mechanics and general functional task performance    Time 3   Period Weeks   Status New     PT SHORT TERM GOAL #2   Title Patient will be able to ambulate unlimited distances with pain L knee no more than 1/10 in order to improve general mobility and community access    Time 3   Period Weeks   Status New     PT SHORT TERM GOAL #3   Title Patient to be independent in management strategies for correct icing technique, patella/scar massage, and retrograde massage in order to assist with progression of status and improve self-efficacy in managing condition    Time 3   Period Weeks   Status New     PT SHORT TERM GOAL #4   Title Patient to be independent in correctly and consistently performing appropriate HEP, to be updated PRN    Time 1   Period Weeks   Status New           PT Long Term Goals - 08/20/16 1803      PT LONG TERM GOAL #1   Title Patient to demonstrate strength 5/5 in order to improve functional task performance and reduce pain with extended activities    Time 6   Period Weeks   Status New     PT LONG TERM GOAL #2   Title Patient to be able to ambulate 6100ft during to demonstrate improved mobility and safe community access    Time 6   Period Weeks   Status New     PT LONG TERM GOAL #3   Title Patient to be able to maintain SLS for 30 seconds on each LE in order to show reduced fall risk and improved balance skills    Time 6   Period Weeks   Status New     PT LONG TERM GOAL #4   Title Patient to  be able to reciprocally  ascend/descend full flight of stairs with U railing, minimal unsteadiness, good eccentric control in order to improve home and community access skills    Time 6   Period Weeks   Status New               Plan - 08/27/16 1208    Clinical Impression Statement Focused today's session on functional knee ROM today, with extensive manual for scar massage, patella mobility, and with STM done to distal quad to assist in improving general knee ROM and comfort. Able to improve L knee extension based on improved quality of end feel however L knee flexion continues to be quite stiff in general. Ended session with gait with no device on level surfaces with S-Mod(I), occasional cue for heel-toe and stride length.    Rehab Potential Excellent   Clinical Impairments Affecting Rehab Potential good motivation to participate in skilled PT services    PT Frequency 3x / week   PT Duration 6 weeks   PT Treatment/Interventions ADLs/Self Care Home Management;Biofeedback;Cryotherapy;Moist Heat;DME Instruction;Gait training;Stair training;Functional mobility training;Therapeutic activities;Therapeutic exercise;Balance training;Neuromuscular re-education;Patient/family education;Manual techniques;Scar mobilization;Passive range of motion;Energy conservation;Taping   PT Next Visit Plan Session focus on ROM and edema control, then progress to strengthening as ROM normalizes. Balance and gait throughout sessions as well. Monitor healing status of incision.    PT Home Exercise Plan 10/10: quad sets and knee flexion stretch (patient refused handout as he already has these); 10/13:  addition of knee extension hang with light weight on knee and knee drives given.   Consulted and Agree with Plan of Care Patient      Patient will benefit from skilled therapeutic intervention in order to improve the following deficits and impairments:  Abnormal gait, Decreased skin integrity, Improper body mechanics, Pain, Decreased  coordination, Decreased mobility, Decreased scar mobility, Increased muscle spasms, Postural dysfunction, Decreased range of motion, Decreased strength, Hypomobility, Decreased balance, Difficulty walking, Increased edema, Impaired flexibility  Visit Diagnosis: Stiffness of left knee, not elsewhere classified  Localized edema  Difficulty in walking, not elsewhere classified  Unsteadiness on feet     Problem List Patient Active Problem List   Diagnosis Date Noted  . 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 OSA (obstructive sleep apnea) 01/23/2012  . ESSENTIAL HYPERTENSION, BENIGN 01/03/2010  . CORONARY ATHEROSCLEROSIS NATIVE CORONARY ARTERY 01/03/2010  . HYPERLIPIDEMIA-MIXED 05/08/2009  . OBESITY-MORBID (>100') 05/08/2009    Nedra Hai PT, DPT [phone]  [facility] [address] [place], [place], [address] Phone: [phone]   Fax:  [phone]  Name: Evan Warner MRN: [id number] Date of Birth: [birth year]

## 2016-08-28 ENCOUNTER — Ambulatory Visit (HOSPITAL_COMMUNITY): Payer: No Typology Code available for payment source | Admitting: Physical Therapy

## 2016-08-28 DIAGNOSIS — R600 Localized edema: Secondary | ICD-10-CM

## 2016-08-28 DIAGNOSIS — M25662 Stiffness of left knee, not elsewhere classified: Secondary | ICD-10-CM

## 2016-08-28 DIAGNOSIS — R2681 Unsteadiness on feet: Secondary | ICD-10-CM

## 2016-08-28 DIAGNOSIS — R262 Difficulty in walking, not elsewhere classified: Secondary | ICD-10-CM

## 2016-08-28 NOTE — Therapy (Signed)
[facility] [address] [place], [place], [address] Phone: [phone]   Fax:  [phone]  Physical Therapy Treatment  Patient Details  Name: Evan LabellaRoger D Warner MRN: [id number] Date of Birth: 08/06/1951 Referring Provider: Annell GreeningMark Yates   Encounter Date: 08/28/2016      PT End of Session - 08/28/16 1102    Visit Number 5   Number of Visits 18   Date for PT Re-Evaluation 09/10/16   Authorization Type [employer] Choice (this insurance needs G-codes)   Authorization Time Period 08/20/16 to 10/01/16   Authorization - Visit Number 5   Authorization - Number of Visits 10   PT Start Time 1040   PT Stop Time 1119   PT Time Calculation (min) 39 min   Activity Tolerance Patient tolerated treatment well   Behavior During Therapy [facility] for tasks assessed/performed      Past Medical History:  Diagnosis Date  . A-fib (HCC)    chronic afib history per 02/20/16 [facility]  . Arthritis   . Coronary atherosclerosis of native coronary artery    DES ramus 2005, LVEF 55%.Takes Pradaxa daily  . Enlarged prostate    takes Flomax nightly  . Essential hypertension, benign    takes Atenolol,Lisinipril-HCTZ,and Procardia daily  . GERD (gastroesophageal reflux disease)    takes Pantoprazole daily  . Gout    takes Allopurinol daily  . History of kidney stones   . Hyperlipidemia    takes Simvastatin daily  . Joint pain   . Joint swelling   . [facility]  . Mood disorder (HCC)    takes Fluoxetine daily  . Nocturia   . OSA (obstructive sleep apnea)    On CPAP  . Peripheral edema    occasionally  . Pneumonia    history of   . Urinary frequency     Past Surgical History:  Procedure Laterality Date  . ANKLE ARTHROSCOPY     Left  . BACK SURGERY    . CARDIAC CATHETERIZATION  2005  . CORONARY ANGIOPLASTY     1 stent  . skin tags removed    . TOTAL KNEE ARTHROPLASTY     Right  . TOTAL KNEE  ARTHROPLASTY Left 07/31/2016   Procedure: LEFT TOTAL KNEE ARTHROPLASTY;  Surgeon: Eldred MangesMark C Yates, MD;  Location: [facility] OR;  Service: Orthopedics;  Laterality: Left;    There were no vitals filed for this visit.      Subjective Assessment - 08/28/16 1039    Subjective Pt states he was mowed his yard yesterday with as riding mower.  He is not having any problem walking.  At home he is not using his cane.     Pertinent History on beta-blockers, nitro as needed, HTN, OSA, A-fib medically managed, hx gout, hx MI, mood disorder, L ankle surgery, R knee replacement    Patient Stated Goals get back to PLOF    Currently in Pain? No/denies                         [facility] - 08/28/16 0001      Knee/Hip Exercises: Stretches   Quad Stretch Left;3 reps;30 seconds   Knee: Self-Stretch Limitations 15x10 second holds on 12 inch box    Gastroc Stretch 3 reps;30 seconds   Gastroc Stretch Limitations slant board     Knee/Hip Exercises: Standing   Heel Raises 10 reps  Knee Flexion Left;10 reps   Stairs 2 RT 8' step ; 1 RT 4" step      Knee/Hip Exercises: Supine   Quad Sets 15 reps   Heel Slides 10 reps   Heel Slides Limitations 98   Terminal Knee Extension 10 reps   Knee Extension PROM     Knee/Hip Exercises: Prone   Hamstring Curl 10 reps     Manual Therapy   Manual Therapy Joint mobilization;Soft tissue mobilization   Manual therapy comments Manual techniques complete separate rest of tx   Joint Mobilization patella mobs all directions    Soft tissue mobilization scar massage (avoiding scabs); distal L quad STM                 PT Education - 08/27/16 1205    Education provided Yes   Education Details hold off on vitamin E oil as there appears be some ongoing open areas under scabs    Person(s) Educated Patient   Methods Explanation   Comprehension Verbalized understanding          PT Short Term Goals - 08/28/16 1234      PT SHORT  TERM GOAL #1   Title Patient to demonstrate ROM L knee of 0-115 degrees in order to improve gait mechanics and general functional task performance    Time 3   Period Weeks   Status On-going     PT SHORT TERM GOAL #2   Title Patient will be able to ambulate unlimited distances with pain L knee no more than 1/10 in order to improve general mobility and community access    Time 3   Period Weeks   Status On-going     PT SHORT TERM GOAL #3   Title Patient to be independent in management strategies for correct icing technique, patella/scar massage, and retrograde massage in order to assist with progression of status and improve self-efficacy in managing condition    Time 3   Period Weeks   Status On-going     PT SHORT TERM GOAL #4   Title Patient to be independent in correctly and consistently performing appropriate HEP, to be updated PRN    Time 1   Period Weeks   Status On-going           PT Long Term Goals - 08/28/16 1234      PT LONG TERM GOAL #1   Title Patient to demonstrate strength 5/5 in order to improve functional task performance and reduce pain with extended activities    Time 6   Period Weeks   Status On-going     PT LONG TERM GOAL #2   Title Patient to be able to ambulate 630ft during to demonstrate improved mobility and safe community access    Time 6   Period Weeks   Status On-going     PT LONG TERM GOAL #3   Title Patient to be able to maintain SLS for 30 seconds on each LE in order to show reduced fall risk and improved balance skills    Time 6   Period Weeks   Status On-going     PT LONG TERM GOAL #4   Title Patient to be able to reciprocally ascend/descend full flight of stairs with U railing, minimal unsteadiness, good eccentric control in order to improve home and community access skills    Time 6   Period Weeks   Status On-going  Plan - 08/28/16 1103    Clinical Impression Statement Treatment continues to focus on ROM  with Passive, active assist and Active.  ROM has improved to 5 -98 degrees.  PT able to complete steps in a reciprocal motion    Rehab Potential Excellent   Clinical Impairments Affecting Rehab Potential good motivation to participate in skilled PT services    PT Frequency 3x / week   PT Duration 6 weeks   PT Treatment/Interventions ADLs/Self Care Home Management;Biofeedback;Cryotherapy;Moist Heat;DME Instruction;Gait training;Stair training;Functional mobility training;Therapeutic activities;Therapeutic exercise;Balance training;Neuromuscular re-education;Patient/family education;Manual techniques;Scar mobilization;Passive range of motion;Energy conservation;Taping   PT Next Visit Plan Session focus on ROM and edema control, then progress to strengthening as ROM normalizes. Balance and gait throughout sessions as well. Monitor healing status of incision.    PT Home Exercise Plan 10/10: quad sets and knee flexion stretch (patient refused handout as he already has these); 10/13:  addition of knee extension hang with light weight on knee and knee drives given.   Consulted and Agree with Plan of Care Patient      Patient will benefit from skilled therapeutic intervention in order to improve the following deficits and impairments:  Abnormal gait, Decreased skin integrity, Improper body mechanics, Pain, Decreased coordination, Decreased mobility, Decreased scar mobility, Increased muscle spasms, Postural dysfunction, Decreased range of motion, Decreased strength, Hypomobility, Decreased balance, Difficulty walking, Increased edema, Impaired flexibility  Visit Diagnosis: Stiffness of left knee, not elsewhere classified  Localized edema  Difficulty in walking, not elsewhere classified  Unsteadiness on feet     Problem List Patient Active Problem List   Diagnosis Date Noted  . 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 OSA (obstructive sleep apnea) 01/23/2012  . ESSENTIAL HYPERTENSION, BENIGN 01/03/2010  .  CORONARY ATHEROSCLEROSIS NATIVE CORONARY ARTERY 01/03/2010  . HYPERLIPIDEMIA-MIXED 05/08/2009  . OBESITY-MORBID (>100') 05/08/2009    Virgina Organ, PT CLT [phone] 08/28/2016, 12:35 PM  [facility] [address] [place], [place], [address] Phone: [phone]   Fax:  [phone]  Name: Evan Warner MRN: [id number] Date of Birth: [birth year]

## 2016-08-30 ENCOUNTER — Ambulatory Visit (HOSPITAL_COMMUNITY): Payer: No Typology Code available for payment source | Admitting: Physical Therapy

## 2016-08-30 DIAGNOSIS — R600 Localized edema: Secondary | ICD-10-CM

## 2016-08-30 DIAGNOSIS — M25662 Stiffness of left knee, not elsewhere classified: Secondary | ICD-10-CM

## 2016-08-30 DIAGNOSIS — R2681 Unsteadiness on feet: Secondary | ICD-10-CM

## 2016-08-30 DIAGNOSIS — R262 Difficulty in walking, not elsewhere classified: Secondary | ICD-10-CM

## 2016-08-30 NOTE — Therapy (Signed)
[facility] [address] [place], [place], [address] Phone: [phone]   Fax:  [phone]  Physical Therapy Treatment  Patient Details  Name: Evan Warner MRN: [id number] Date of Birth: 09/22/1951 Referring Provider: Annell GreeningMark Yates   Encounter Date: 08/30/2016      PT End of Session - 08/30/16 1003    Visit Number 6   Number of Visits 18   Date for PT Re-Evaluation 09/10/16   Authorization Type [employer] Choice (this insurance needs G-codes)   Authorization Time Period 08/20/16 to 10/01/16   Authorization - Visit Number 6   Authorization - Number of Visits 10   PT Start Time 0948   PT Stop Time 1030   PT Time Calculation (min) 42 min   Activity Tolerance Patient tolerated treatment well   Behavior [facility] Therapy [facility] for tasks assessed/performed      Past Medical History:  Diagnosis Date  . A-fib (HCC)    chronic afib history per 02/20/16 [facility] cardi[facility] note  . Arthritis   . Coronary atherosclerosis of native coronary artery    DES ramus 2005, LVEF 55%.Takes Pradaxa daily  . Enlarged prostate    takes Flomax nightly  . Essential hypertension, benign    takes Atenolol,Lisinipril-HCTZ,and Procardia daily  . GERD (gastroesophageal reflux disease)    takes Pantoprazole daily  . Gout    takes Allopurinol daily  . History of kidney stones   . Hyperlipidemia    takes Simvastatin daily  . Joint pain   . Joint swelling   . [facility]  . Mood disorder (HCC)    takes Fluoxetine daily  . Nocturia   . OSA (obstructive sleep apnea)    On CPAP  . Peripheral edema    occasionally  . Pneumonia    history of   . Urinary frequency     Past Surgical History:  Procedure Laterality Date  . ANKLE ARTHROSCOPY     Left  . BACK SURGERY    . CARDIAC CATHETERIZATION  2005  . CORONARY ANGIOPLASTY     1 stent  . skin tags removed    . TOTAL KNEE ARTHROPLASTY     Right  . TOTAL KNEE  ARTHROPLASTY Left 07/31/2016   Procedure: LEFT TOTAL KNEE ARTHROPLASTY;  Surgeon: Eldred MangesMark C Yates, MD;  Location: [facility] OR;  Service: Orthopedics;  Laterality: Left;    There were no vitals filed for this visit.      Subjective Assessment - 08/30/16 0952    Subjective Pt states he is going up and down his basement steps now; it is a little difficult but doing better.  Pt is going to the beach this weekend.    Pertinent History on beta-blockers, nitro as needed, HTN, OSA, A-fib medically managed, hx gout, hx MI, mood disorder, L ankle surgery, R knee replacement    Patient Stated Goals get back to PLOF    Currently in Pain? No/denies                         [facility] - 08/30/16 0001      Knee/Hip Exercises: Stretches   Quad Stretch Left;3 reps;30 seconds   Knee: Self-Stretch Limitations 10" x 5 reps    Gastroc Stretch 3 reps;30 seconds   Gastroc Stretch Limitations slant board     Knee/Hip Exercises: Standing   Forward Step Up Left;10 reps;Step Height: 6"  Step Down Left;10 reps   Rocker Board 2 minutes   Rocker Board Limitations Rt/ Lt and front to back    SLS one finger hold x 30"  x 2      Knee/Hip Exercises: Supine   Knee Extension PROM   Knee Extension Limitations 4   Knee Flexion PROM   Knee Flexion Limitations 100     Manual Therapy   Manual Therapy Soft tissue mobilization   Manual therapy comments Manual techniques complete separate rest of tx   Soft tissue mobilization to posterior aspect of knee to decrease tension in knee musculature.                   PT Short Term Goals - 08/28/16 1234      PT SHORT TERM GOAL #1   Title Patient to demonstrate ROM L knee of 0-115 degrees in order to improve gait mechanics and general functional task performance    Time 3   Period Weeks   Status On-going     PT SHORT TERM GOAL #2   Title Patient will be able to ambulate unlimited distances with pain L knee no more than 1/10 in  order to improve general mobility and community access    Time 3   Period Weeks   Status On-going     PT SHORT TERM GOAL #3   Title Patient to be independent in management strategies for correct icing technique, patella/scar massage, and retrograde massage in order to assist with progression of status and improve self-efficacy in managing condition    Time 3   Period Weeks   Status On-going     PT SHORT TERM GOAL #4   Title Patient to be independent in correctly and consistently performing appropriate HEP, to be updated PRN    Time 1   Period Weeks   Status On-going           PT Long Term Goals - 08/28/16 1234      PT LONG TERM GOAL #1   Title Patient to demonstrate strength 5/5 in order to improve functional task performance and reduce pain with extended activities    Time 6   Period Weeks   Status On-going     PT LONG TERM GOAL #2   Title Patient to be able to ambulate 617ft during to demonstrate improved mobility and safe community access    Time 6   Period Weeks   Status On-going     PT LONG TERM GOAL #3   Title Patient to be able to maintain SLS for 30 seconds on each LE in order to show reduced fall risk and improved balance skills    Time 6   Period Weeks   Status On-going     PT LONG TERM GOAL #4   Title Patient to be able to reciprocally ascend/descend full flight of stairs with U railing, minimal unsteadiness, good eccentric control in order to improve home and community access skills    Time 6   Period Weeks   Status On-going               Plan - 08/30/16 1003    Clinical Impression Statement Added rocker board, SLS and step up/down to program.  Pt continues to improve in ROM and stability.  Pt only needs minimal cuing to complete exercise with proper technique.     Rehab Potential Excellent   Clinical Impairments Affecting Rehab Potential good motivation to participate in skilled  PT services    PT Frequency 3x / week   PT Duration 6 weeks    PT Treatment/Interventions ADLs/Self Care Home Management;Biofeedback;Cryotherapy;Moist Heat;DME Instruction;Gait training;Stair training;Functional mobility training;Therapeutic activities;Therapeutic exercise;Balance training;Neuromuscular re-education;Patient/family education;Manual techniques;Scar mobilization;Passive range of motion;Energy conservation;Taping   PT Next Visit Plan PT treatment to focus on ROM and balance    PT Home Exercise Plan 10/10: quad sets and knee flexion stretch (patient refused handout as he already has these); 10/13:  addition of knee extension hang with light weight on knee and knee drives given.   Consulted and Agree with Plan of Care Patient      Patient will benefit from skilled therapeutic intervention in order to improve the following deficits and impairments:  Abnormal gait, Decreased skin integrity, Improper body mechanics, Pain, Decreased coordination, Decreased mobility, Decreased scar mobility, Increased muscle spasms, Postural dysfunction, Decreased range of motion, Decreased strength, Hypomobility, Decreased balance, Difficulty walking, Increased edema, Impaired flexibility  Visit Diagnosis: Stiffness of left knee, not elsewhere classified  Localized edema  Difficulty in walking, not elsewhere classified  Unsteadiness on feet     Problem List Patient Active Problem List   Diagnosis Date Noted  . 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 OSA (obstructive sleep apnea) 01/23/2012  . ESSENTIAL HYPERTENSION, BENIGN 01/03/2010  . CORONARY ATHEROSCLEROSIS NATIVE CORONARY ARTERY 01/03/2010  . HYPERLIPIDEMIA-MIXED 05/08/2009  . OBESITY-MORBID (>100') 05/08/2009    Virgina Organ, PT CLT [phone] 08/30/2016, 10:34 AM  [facility] [address] [place], [place], [address] Phone: [phone]   Fax:  [phone]  Name: Evan Warner MRN: [id number] Date of Birth: 1951/04/26

## 2016-09-02 ENCOUNTER — Ambulatory Visit (HOSPITAL_COMMUNITY): Payer: No Typology Code available for payment source | Admitting: Physical Therapy

## 2016-09-02 DIAGNOSIS — R600 Localized edema: Secondary | ICD-10-CM

## 2016-09-02 DIAGNOSIS — M25662 Stiffness of left knee, not elsewhere classified: Secondary | ICD-10-CM | POA: Diagnosis not present

## 2016-09-02 DIAGNOSIS — R2681 Unsteadiness on feet: Secondary | ICD-10-CM

## 2016-09-02 DIAGNOSIS — R262 Difficulty in walking, not elsewhere classified: Secondary | ICD-10-CM

## 2016-09-02 NOTE — Therapy (Signed)
[facility] [address] [place], [place], [address] Phone: [phone]   Fax:  [phone]  Physical Therapy Treatment  Patient Details  Name: Evan Warner MRN: [id number] Date of Birth: 05/21/1951 Referring Provider: Annell GreeningMark Yates   Encounter Date: 09/02/2016      PT End of Session - 09/02/16 1344    Visit Number 7   Number of Visits 18   Date for PT Re-Evaluation 09/10/16   Authorization Type [employer] Choice (this insurance needs G-codes)   Authorization Time Period 08/20/16 to 10/01/16   Authorization - Visit Number 7   Authorization - Number of Visits 10   PT Start Time 1303   PT Stop Time 1344   PT Time Calculation (min) 41 min   Activity Tolerance Patient tolerated treatment well   Behavior During Therapy [facility] for tasks assessed/performed      Past Medical History:  Diagnosis Date  . A-fib (HCC)    chronic afib history per 02/20/16 [facility] note  . Arthritis   . Coronary atherosclerosis of native coronary artery    DES ramus 2005, LVEF 55%.Takes Pradaxa daily  . Enlarged prostate    takes Flomax nightly  . Essential hypertension, benign    takes Atenolol,Lisinipril-HCTZ,and Procardia daily  . GERD (gastroesophageal reflux disease)    takes Pantoprazole daily  . Gout    takes Allopurinol daily  . History of kidney stones   . Hyperlipidemia    takes Simvastatin daily  . Joint pain   . Joint swelling   . [facility]  . Mood disorder (HCC)    takes Fluoxetine daily  . Nocturia   . OSA (obstructive sleep apnea)    On CPAP  . Peripheral edema    occasionally  . Pneumonia    history of   . Urinary frequency     Past Surgical History:  Procedure Laterality Date  . ANKLE ARTHROSCOPY     Left  . BACK SURGERY    . CARDIAC CATHETERIZATION  2005  . CORONARY ANGIOPLASTY     1 stent  . skin tags removed    . TOTAL KNEE ARTHROPLASTY     Right  . TOTAL KNEE  ARTHROPLASTY Left 07/31/2016   Procedure: LEFT TOTAL KNEE ARTHROPLASTY;  Surgeon: Eldred MangesMark C Yates, MD;  Location: [facility] OR;  Service: Orthopedics;  Laterality: Left;    There were no vitals filed for this visit.      Subjective Assessment - 09/02/16 1304    Subjective Patient arrives stating he is doing well, he went to the beach this weekend and reports it went very well, he was even able to hit some golf balls with no increased pain. He feels ready to get rid of his cane.    Pertinent History on beta-blockers, nitro as needed, HTN, OSA, A-fib medically managed, hx gout, hx MI, mood disorder, L ankle surgery, R knee replacement    Patient Stated Goals get back to PLOF    Currently in Pain? No/denies            [facility] PT Assessment - 09/02/16 0001      AROM   Left Knee Extension 5   Left Knee Flexion 100                     [facility] Adult PT Treatment/Exercise - 09/02/16 0001      Knee/Hip Exercises: Stretches   Active Hamstring Stretch  3 reps;30 seconds;Left   Active Hamstring Stretch Limitations 12 inch box    Knee: Self-Stretch Limitations 15x10 second holds    Gastroc Stretch 30 seconds;1 rep;Other (comment)   Gastroc Stretch Limitations slant board 1x90 seconds     Knee/Hip Exercises: Supine   Quad Sets 20 reps   Quad Sets Limitations 5 second holds      Manual Therapy   Manual Therapy Soft tissue mobilization;Joint mobilization   Manual therapy comments Manual techniques complete separate rest of tx   Edema Management Retro massage with LE elevated   Joint Mobilization patella mobs all directions, knee flexion mobs tib on femur grade III    Soft tissue mobilization scar massage (avoiding scabs/open areas), L distal quad                 PT Education - 09/02/16 1344    Education provided No          PT Short Term Goals - 08/28/16 1234      PT SHORT TERM GOAL #1   Title Patient to demonstrate ROM L knee of 0-115 degrees in order to improve gait  mechanics and general functional task performance    Time 3   Period Weeks   Status On-going     PT SHORT TERM GOAL #2   Title Patient will be able to ambulate unlimited distances with pain L knee no more than 1/10 in order to improve general mobility and community access    Time 3   Period Weeks   Status On-going     PT SHORT TERM GOAL #3   Title Patient to be independent in management strategies for correct icing technique, patella/scar massage, and retrograde massage in order to assist with progression of status and improve self-efficacy in managing condition    Time 3   Period Weeks   Status On-going     PT SHORT TERM GOAL #4   Title Patient to be independent in correctly and consistently performing appropriate HEP, to be updated PRN    Time 1   Period Weeks   Status On-going           PT Long Term Goals - 08/28/16 1234      PT LONG TERM GOAL #1   Title Patient to demonstrate strength 5/5 in order to improve functional task performance and reduce pain with extended activities    Time 6   Period Weeks   Status On-going     PT LONG TERM GOAL #2   Title Patient to be able to ambulate 644ft during to demonstrate improved mobility and safe community access    Time 6   Period Weeks   Status On-going     PT LONG TERM GOAL #3   Title Patient to be able to maintain SLS for 30 seconds on each LE in order to show reduced fall risk and improved balance skills    Time 6   Period Weeks   Status On-going     PT LONG TERM GOAL #4   Title Patient to be able to reciprocally ascend/descend full flight of stairs with U railing, minimal unsteadiness, good eccentric control in order to improve home and community access skills    Time 6   Period Weeks   Status On-going               Plan - 09/02/16 1345    Clinical Impression Statement Continued with functional stretching and strengthening today, continuing to work  on general ROM of knee as well as manual techniques  today; patient continues to be doing very well and is making good progress towards all goals as well as describing a much improved level of function outside of the clinic. Patient states that before surgery, he had much less ROM in his surgical knee and is quite pleased with the progress/ROM he has now.    Rehab Potential Excellent   Clinical Impairments Affecting Rehab Potential good motivation to participate in skilled PT services    PT Frequency 3x / week   PT Duration 6 weeks   PT Treatment/Interventions ADLs/Self Care Home Management;Biofeedback;Cryotherapy;Moist Heat;DME Instruction;Gait training;Stair training;Functional mobility training;Therapeutic activities;Therapeutic exercise;Balance training;Neuromuscular re-education;Patient/family education;Manual techniques;Scar mobilization;Passive range of motion;Energy conservation;Taping   PT Next Visit Plan PT treatment to focus on ROM and balance    PT Home Exercise Plan 10/10: quad sets and knee flexion stretch (patient refused handout as he already has these); 10/13:  addition of knee extension hang with light weight on knee and knee drives given.   Consulted and Agree with Plan of Care Patient      Patient will benefit from skilled therapeutic intervention in order to improve the following deficits and impairments:  Abnormal gait, Decreased skin integrity, Improper body mechanics, Pain, Decreased coordination, Decreased mobility, Decreased scar mobility, Increased muscle spasms, Postural dysfunction, Decreased range of motion, Decreased strength, Hypomobility, Decreased balance, Difficulty walking, Increased edema, Impaired flexibility  Visit Diagnosis: Stiffness of left knee, not elsewhere classified  Localized edema  Difficulty in walking, not elsewhere classified  Unsteadiness on feet     Problem List Patient Active Problem List   Diagnosis Date Noted  . 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 OSA (obstructive sleep apnea) 01/23/2012   . ESSENTIAL HYPERTENSION, BENIGN 01/03/2010  . CORONARY ATHEROSCLEROSIS NATIVE CORONARY ARTERY 01/03/2010  . HYPERLIPIDEMIA-MIXED 05/08/2009  . OBESITY-MORBID (>100') 05/08/2009    Nedra Hai PT, DPT [phone]  [facility] [address] [place], [place], [address] Phone: [phone]   Fax:  [phone]  Name: Evan Warner MRN: [id number] Date of Birth: [birth year]

## 2016-09-04 ENCOUNTER — Ambulatory Visit (HOSPITAL_COMMUNITY): Payer: No Typology Code available for payment source | Admitting: Physical Therapy

## 2016-09-04 DIAGNOSIS — R600 Localized edema: Secondary | ICD-10-CM

## 2016-09-04 DIAGNOSIS — M25662 Stiffness of left knee, not elsewhere classified: Secondary | ICD-10-CM | POA: Diagnosis not present

## 2016-09-04 DIAGNOSIS — R262 Difficulty in walking, not elsewhere classified: Secondary | ICD-10-CM

## 2016-09-04 DIAGNOSIS — R2681 Unsteadiness on feet: Secondary | ICD-10-CM

## 2016-09-04 NOTE — Therapy (Signed)
[facility] [address] [place], [place], [address] Phone: [phone]   Fax:  [phone]  Physical Therapy Treatment  Patient Details  Name: Evan Warner MRN: [id number] Date of Birth: [birth year] Referring Provider: Annell Greening   Encounter Date: 09/04/2016      PT End of Session - 09/04/16 1028    Visit Number 8   Number of Visits 18   Date for PT Re-Evaluation 09/10/16   Authorization Type [employer] Choice (this insurance needs G-codes)   Authorization Time Period 08/20/16 to 10/01/16   Authorization - Visit Number 8   Authorization - Number of Visits 10   PT Start Time 0946   PT Stop Time 1028   PT Time Calculation (min) 42 min   Activity Tolerance Patient tolerated treatment well   Behavior During Therapy [facility] for tasks assessed/performed      Past Medical History:  Diagnosis Date  . A-fib (HCC)    chronic afib history per 02/20/16 [facility] cardioloy note  . Arthritis   . Coronary atherosclerosis of native coronary artery    DES ramus 2005, LVEF 55%.Takes Pradaxa daily  . Enlarged prostate    takes Flomax nightly  . Essential hypertension, benign    takes Atenolol,Lisinipril-HCTZ,and Procardia daily  . GERD (gastroesophageal reflux disease)    takes Pantoprazole daily  . Gout    takes Allopurinol daily  . History of kidney stones   . Hyperlipidemia    takes Simvastatin daily  . Joint pain   . Joint swelling   . MI (myocardial infarction)    [facility]  . Mood disorder (HCC)    takes Fluoxetine daily  . Nocturia   . OSA (obstructive sleep apnea)    On CPAP  . Peripheral edema    occasionally  . Pneumonia    history of   . Urinary frequency     Past Surgical History:  Procedure Laterality Date  . ANKLE ARTHROSCOPY     Left  . BACK SURGERY    . CARDIAC CATHETERIZATION  2005  . CORONARY ANGIOPLASTY     1 stent  . skin tags removed    . TOTAL KNEE ARTHROPLASTY     Right  . TOTAL KNEE  ARTHROPLASTY Left 07/31/2016   Procedure: LEFT TOTAL KNEE ARTHROPLASTY;  Surgeon: Eldred Manges, MD;  Location: [facility] OR;  Service: Orthopedics;  Laterality: Left;    There were no vitals filed for this visit.      Subjective Assessment - 09/04/16 0948    Subjective Patient arrives stating he is doing well, he hit golf balls again and is feeling very well over all; he was a little sore after last session but is feeling good    Pertinent History on beta-blockers, nitro as needed, HTN, OSA, A-fib medically managed, hx gout, hx MI, mood disorder, L ankle surgery, R knee replacement    Currently in Pain? No/denies                         [facility] Adult PT Treatment/Exercise - 09/04/16 0001      Knee/Hip Exercises: Stretches   Active Hamstring Stretch 3 reps;30 seconds;Left   Active Hamstring Stretch Limitations 12 inch box    Quad Stretch Left;3 reps;30 seconds   Knee: Self-Stretch Limitations 15x10 second holds    Gastroc Stretch 30 seconds;Other (comment);3 reps   Gastroc Stretch Limitations slant board      Knee/Hip Exercises: Standing  Rocker Board 2 minutes   Rocker Board Limitations Rt/ Lt and front to back      Knee/Hip Exercises: Seated   Hamstring Curl AAROM;Left;20 reps   Hamstring Limitations 5 second holds      Knee/Hip Exercises: Supine   Quad Sets 20 reps   Quad Sets Limitations 5 second hold    Other Supine Knee/Hip Exercises AAROM knee extension and flexion 1x10 with overpressure                 PT Education - 09/04/16 1028    Education provided Yes   Education Details HEP updates, re-assess on Monday    Person(s) Educated Patient   Methods Explanation   Comprehension Verbalized understanding          PT Short Term Goals - 08/28/16 1234      PT SHORT TERM GOAL #1   Title Patient to demonstrate ROM L knee of 0-115 degrees in order to improve gait mechanics and general functional task performance    Time 3   Period Weeks   Status  On-going     PT SHORT TERM GOAL #2   Title Patient will be able to ambulate unlimited distances with pain L knee no more than 1/10 in order to improve general mobility and community access    Time 3   Period Weeks   Status On-going     PT SHORT TERM GOAL #3   Title Patient to be independent in management strategies for correct icing technique, patella/scar massage, and retrograde massage in order to assist with progression of status and improve self-efficacy in managing condition    Time 3   Period Weeks   Status On-going     PT SHORT TERM GOAL #4   Title Patient to be independent in correctly and consistently performing appropriate HEP, to be updated PRN    Time 1   Period Weeks   Status On-going           PT Long Term Goals - 08/28/16 1234      PT LONG TERM GOAL #1   Title Patient to demonstrate strength 5/5 in order to improve functional task performance and reduce pain with extended activities    Time 6   Period Weeks   Status On-going     PT LONG TERM GOAL #2   Title Patient to be able to ambulate 636ft during to demonstrate improved mobility and safe community access    Time 6   Period Weeks   Status On-going     PT LONG TERM GOAL #3   Title Patient to be able to maintain SLS for 30 seconds on each LE in order to show reduced fall risk and improved balance skills    Time 6   Period Weeks   Status On-going     PT LONG TERM GOAL #4   Title Patient to be able to reciprocally ascend/descend full flight of stairs with U railing, minimal unsteadiness, good eccentric control in order to improve home and community access skills    Time 6   Period Weeks   Status On-going               Plan - 09/04/16 1029    Clinical Impression Statement Continued focus on ROM based activities today as patient reports that this is his remaining concern; did note some improvement with ROM after manual overpressure/AAROM today, and encouraged patient to perform quad  stretch at home and begin exercise  on bicycle at gym to further focus improvement of general ROM. Consider possible upcoming DC due to very high level of function- will further discuss/assess this at re-assessment on 10/30.    Rehab Potential Excellent   Clinical Impairments Affecting Rehab Potential good motivation to participate in skilled PT services    PT Frequency 3x / week   PT Duration 6 weeks   PT Treatment/Interventions ADLs/Self Care Home Management;Biofeedback;Cryotherapy;Moist Heat;DME Instruction;Gait training;Stair training;Functional mobility training;Therapeutic activities;Therapeutic exercise;Balance training;Neuromuscular re-education;Patient/family education;Manual techniques;Scar mobilization;Passive range of motion;Energy conservation;Taping   PT Next Visit Plan PT treatment to focus on ROM and balance    PT Home Exercise Plan 10/10: quad sets and knee flexion stretch (patient refused handout as he already has these); 10/13:  addition of knee extension hang with light weight on knee and knee drives given.   Consulted and Agree with Plan of Care Patient      Patient will benefit from skilled therapeutic intervention in order to improve the following deficits and impairments:  Abnormal gait, Decreased skin integrity, Improper body mechanics, Pain, Decreased coordination, Decreased mobility, Decreased scar mobility, Increased muscle spasms, Postural dysfunction, Decreased range of motion, Decreased strength, Hypomobility, Decreased balance, Difficulty walking, Increased edema, Impaired flexibility  Visit Diagnosis: Stiffness of left knee, not elsewhere classified  Localized edema  Difficulty in walking, not elsewhere classified  Unsteadiness on feet     Problem List Patient Active Problem List   Diagnosis Date Noted  . 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 OSA (obstructive sleep apnea) 01/23/2012  . ESSENTIAL HYPERTENSION, BENIGN 01/03/2010  . CORONARY ATHEROSCLEROSIS NATIVE  CORONARY ARTERY 01/03/2010  . HYPERLIPIDEMIA-MIXED 05/08/2009  . OBESITY-MORBID (>100') 05/08/2009    Nedra HaiKristen Ketra Duchesne PT, DPT [phone]  [facility] [address] [place], [place], [address] Phone: [phone]   Fax:  [phone]  Name: Janett LabellaRoger D Weedon MRN: [id number] Date of Birth: 10/04/1951

## 2016-09-04 NOTE — Patient Instructions (Signed)
    Prone Quad Stretch with Strap  Place strap/belt around foot. Lay on stomach and hold strap over opposite should as to the leg. Pull strap bending knee until stretch is felt in front of leg.  Hold for 30 seconds and relax; repeat 3 times on your left leg, at least twice a day.    BICYCLE  Use one of the reclining bikes at the senior center or [facility]; you may start with partial rotations but over time progress to full rotations as able. This exercise is specifically for the range of motion in your knee, so you will likely feel tightness and some discomfort when working on this exercise.   Start with 5 minutes at a time, and work your way up to 15-20 minutes as able.

## 2016-09-06 ENCOUNTER — Ambulatory Visit (HOSPITAL_COMMUNITY): Payer: No Typology Code available for payment source | Admitting: Physical Therapy

## 2016-09-06 DIAGNOSIS — R2681 Unsteadiness on feet: Secondary | ICD-10-CM

## 2016-09-06 DIAGNOSIS — M25662 Stiffness of left knee, not elsewhere classified: Secondary | ICD-10-CM | POA: Diagnosis not present

## 2016-09-06 DIAGNOSIS — R600 Localized edema: Secondary | ICD-10-CM

## 2016-09-06 DIAGNOSIS — R262 Difficulty in walking, not elsewhere classified: Secondary | ICD-10-CM

## 2016-09-06 NOTE — Therapy (Signed)
[facility] [address] [place], [place], [address] Phone: [phone]   Fax:  [phone]  Physical Therapy Treatment  Patient Details  Name: Evan LabellaRoger D Smucker MRN: [id number] Date of Birth: 07/26/1951 Referring Provider: Annell GreeningMark Yates   Encounter Date: 09/06/2016      PT End of Session - 09/06/16 1051    Visit Number 9   Number of Visits 18   Date for PT Re-Evaluation 09/10/16   Authorization Type [employer] Choice (this insurance needs G-codes)   Authorization Time Period 08/20/16 to 10/01/16   Authorization - Visit Number 9   Authorization - Number of Visits 10   PT Start Time 1036   PT Stop Time 1120   PT Time Calculation (min) 44 min   Activity Tolerance Patient tolerated treatment well   Behavior During Therapy [facility] for tasks assessed/performed      Past Medical History:  Diagnosis Date  . A-fib (HCC)    chronic afib history per 02/20/16 [facility] cardioloy note  . Arthritis   . Coronary atherosclerosis of native coronary artery    DES ramus 2005, LVEF 55%.Takes Pradaxa daily  . Enlarged prostate    takes Flomax nightly  . Essential hypertension, benign    takes Atenolol,Lisinipril-HCTZ,and Procardia daily  . GERD (gastroesophageal reflux disease)    takes Pantoprazole daily  . Gout    takes Allopurinol daily  . History of kidney stones   . Hyperlipidemia    takes Simvastatin daily  . Joint pain   . Joint swelling   . [facility]  . Mood disorder (HCC)    takes Fluoxetine daily  . Nocturia   . OSA (obstructive sleep apnea)    On CPAP  . Peripheral edema    occasionally  . Pneumonia    history of   . Urinary frequency     Past Surgical History:  Procedure Laterality Date  . ANKLE ARTHROSCOPY     Left  . BACK SURGERY    . CARDIAC CATHETERIZATION  2005  . CORONARY ANGIOPLASTY     1 stent  . skin tags removed    . TOTAL KNEE ARTHROPLASTY     Right  . TOTAL KNEE  ARTHROPLASTY Left 07/31/2016   Procedure: LEFT TOTAL KNEE ARTHROPLASTY;  Surgeon: Eldred MangesMark C Yates, MD;  Location: [facility] OR;  Service: Orthopedics;  Laterality: Left;    There were no vitals filed for this visit.      Subjective Assessment - 09/06/16 1039    Subjective Pt states that he is still having difficulty going down the steps not so much going up.     Pertinent History on beta-blockers, nitro as needed, HTN, OSA, A-fib medically managed, hx gout, hx MI, mood disorder, L ankle surgery, R knee replacement    Currently in Pain? No/denies                         [facility] Adult PT Treatment/Exercise - 09/06/16 0001      Knee/Hip Exercises: Stretches   Active Hamstring Stretch 3 reps;30 seconds;Left   Active Hamstring Stretch Limitations 12 inch box    Quad Stretch Left;3 reps;30 seconds   Knee: Self-Stretch Limitations 15x10 second holds    Gastroc Stretch 30 seconds;Other (comment);3 reps   Gastroc Stretch Limitations slant board      Knee/Hip Exercises: Standing   Heel Raises Left;5 reps   Heel Raises Limitations x2  Knee Flexion Left;10 reps   Terminal Knee Extension Limitations 10   Forward Step Up --   Step Down Left;10 reps   Step Down Limitations slow    Rocker Board 2 minutes   Rocker Board Limitations Rt/ Lt and front to back    SLS on foam x 5     Knee/Hip Exercises: Supine   Knee Extension PROM   Knee Extension Limitations 3   Knee Flexion Limitations 105   Other Supine Knee/Hip Exercises AAROM knee extension and flexion 1x10 with overpressure      Manual Therapy   Manual Therapy Manual Lymphatic Drainage (MLD)   Manual Lymphatic Drainage (MLD) for entire LE to decrease congestion                   PT Short Term Goals - 08/28/16 1234      PT SHORT TERM GOAL #1   Title Patient to demonstrate ROM L knee of 0-115 degrees in order to improve gait mechanics and general functional task performance    Time 3   Period Weeks   Status  On-going     PT SHORT TERM GOAL #2   Title Patient will be able to ambulate unlimited distances with pain L knee no more than 1/10 in order to improve general mobility and community access    Time 3   Period Weeks   Status On-going     PT SHORT TERM GOAL #3   Title Patient to be independent in management strategies for correct icing technique, patella/scar massage, and retrograde massage in order to assist with progression of status and improve self-efficacy in managing condition    Time 3   Period Weeks   Status On-going     PT SHORT TERM GOAL #4   Title Patient to be independent in correctly and consistently performing appropriate HEP, to be updated PRN    Time 1   Period Weeks   Status On-going           PT Long Term Goals - 08/28/16 1234      PT LONG TERM GOAL #1   Title Patient to demonstrate strength 5/5 in order to improve functional task performance and reduce pain with extended activities    Time 6   Period Weeks   Status On-going     PT LONG TERM GOAL #2   Title Patient to be able to ambulate 61ft during to demonstrate improved mobility and safe community access    Time 6   Period Weeks   Status On-going     PT LONG TERM GOAL #3   Title Patient to be able to maintain SLS for 30 seconds on each LE in order to show reduced fall risk and improved balance skills    Time 6   Period Weeks   Status On-going     PT LONG TERM GOAL #4   Title Patient to be able to reciprocally ascend/descend full flight of stairs with U railing, minimal unsteadiness, good eccentric control in order to improve home and community access skills    Time 6   Period Weeks   Status On-going               Plan - 09/06/16 1051    Clinical Impression Statement Treatment continues to focuse on decreasing edema and increasing ROM.  Added standing terminal extension as well as single leg raise to pt program.  PT continues to slowly improve ROM  Rehab Potential Excellent    Clinical Impairments Affecting Rehab Potential good motivation to participate in skilled PT services    PT Frequency 3x / week   PT Duration 6 weeks   PT Treatment/Interventions ADLs/Self Care Home Management;Biofeedback;Cryotherapy;Moist Heat;DME Instruction;Gait training;Stair training;Functional mobility training;Therapeutic activities;Therapeutic exercise;Balance training;Neuromuscular re-education;Patient/family education;Manual techniques;Scar mobilization;Passive range of motion;Energy conservation;Taping   PT Next Visit Plan PT treatment to focus on ROM and balance    PT Home Exercise Plan 10/10: quad sets and knee flexion stretch (patient refused handout as he already has these); 10/13:  addition of knee extension hang with light weight on knee and knee drives given.   Consulted and Agree with Plan of Care Patient      Patient will benefit from skilled therapeutic intervention in order to improve the following deficits and impairments:  Abnormal gait, Decreased skin integrity, Improper body mechanics, Pain, Decreased coordination, Decreased mobility, Decreased scar mobility, Increased muscle spasms, Postural dysfunction, Decreased range of motion, Decreased strength, Hypomobility, Decreased balance, Difficulty walking, Increased edema, Impaired flexibility  Visit Diagnosis: Stiffness of left knee, not elsewhere classified  Localized edema  Difficulty in walking, not elsewhere classified  Unsteadiness on feet     Problem List Patient Active Problem List   Diagnosis Date Noted  . 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 OSA (obstructive sleep apnea) 01/23/2012  . ESSENTIAL HYPERTENSION, BENIGN 01/03/2010  . CORONARY ATHEROSCLEROSIS NATIVE CORONARY ARTERY 01/03/2010  . HYPERLIPIDEMIA-MIXED 05/08/2009  . OBESITY-MORBID (>100') 05/08/2009   Virgina Organ, PT CLT [phone] 09/06/2016, 11:19 AM  [facility] [address] [place], [place],  [address] Phone: [phone]   Fax:  [phone]  Name: IRIS TATSCH MRN: [id number] Date of Birth: 06-20-1951

## 2016-09-09 ENCOUNTER — Ambulatory Visit (HOSPITAL_COMMUNITY): Payer: No Typology Code available for payment source | Admitting: Physical Therapy

## 2016-09-09 DIAGNOSIS — M25662 Stiffness of left knee, not elsewhere classified: Secondary | ICD-10-CM

## 2016-09-09 DIAGNOSIS — R2681 Unsteadiness on feet: Secondary | ICD-10-CM

## 2016-09-09 DIAGNOSIS — R600 Localized edema: Secondary | ICD-10-CM

## 2016-09-09 DIAGNOSIS — R262 Difficulty in walking, not elsewhere classified: Secondary | ICD-10-CM

## 2016-09-09 NOTE — Patient Instructions (Signed)
   PRONE KNEE HANGS  While lying down on your stomach, allow your leg to hang off the end of a table/bed. Position yourself so that your knee cap is just over the end of the table/bed.   Just relax your body and allow gravity to stretch your knee into a more straightened position.  Add an ankle weight (or a grocery bag with some cans in it) for increased stretch.  Start by holding for 2-3 minutes, and add 1-2 minutes every 3-4 days until you have reached 10 minute holds. Repeat 2-3 times per day.      KNEE EXTENSION STRETCH - PROPPED  While seated, prop your foot up on another chair and allow gravity to stretch your knee towards a more straightened position.   Start by holding for 2-3 minutes, and add 1-2 minutes every 3-4 days until you have reached 10 minute holds. Repeat 2-3 times per day.      KNEE FLEXION STRETCH - SELF ASSISTED  While seated in a chair, use your unaffected leg to bend your affected knee until a stretch is felt.  Hold for 15 seconds, repeat 10-15 times at least 3 times per day.      PRONE KNEE FLEXION STRETCH WITH BELT - ANKLE ANCHORED  Start by lying on your stomach with a strap or 2 belts linked together and looped it around your affected side ankle.  Next, use the belt to pull the knee into a bent position allowing for a stretch as shown.  Hold for 30 seconds and repeat 3 times, 2-3 times per day.

## 2016-09-09 NOTE — Therapy (Signed)
[place], [place], [address] Phone: [phone]   Fax:  [phone]  Physical Therapy Treatment (Re-Assessment)   Patient Details  Name: RUDRANSH BELLANCA MRN: [id number] Date of Birth: 03/11/1951 Referring Provider: Rodell Perna   Encounter Date: 09/09/2016      PT End of Session - 09/09/16 1040    Visit Number 10   Number of Visits 13   Date for PT Re-Evaluation 09/30/16   Authorization Type [employer] Choice (this insurance needs G-codes; done 10th session)   Authorization Time Period 08/20/16 to 10/01/16   Authorization - Visit Number 10   Authorization - Number of Visits 20   PT Start Time 0946   PT Stop Time 1027   PT Time Calculation (min) 41 min   Activity Tolerance Patient tolerated treatment well   Behavior During Therapy [facility] for tasks assessed/performed      Past Medical History:  Diagnosis Date  . A-fib ([place])    chronic afib history per 02/20/[facility]  . Arthritis   . Coronary atherosclerosis of native coronary artery    DES ramus 2005, LVEF 55%.Takes Pradaxa daily  . Enlarged prostate    takes Flomax nightly  . Essential hypertension, benign    takes Atenolol,Lisinipril-HCTZ,and Procardia daily  . GERD (gastroesophageal reflux disease)    takes Pantoprazole daily  . Gout    takes Allopurinol daily  . History of kidney stones   . Hyperlipidemia    takes Simvastatin daily  . Joint pain   . Joint swelling   . MI (myocardial infarction)    [facility]  . Mood disorder ([place])    takes Fluoxetine daily  . Nocturia   . OSA (obstructive sleep apnea)    On CPAP  . Peripheral edema    occasionally  . Pneumonia    history of   . Urinary frequency     Past Surgical History:  Procedure Laterality Date  . ANKLE ARTHROSCOPY     Left  . BACK SURGERY    . CARDIAC CATHETERIZATION  2005  . CORONARY ANGIOPLASTY     1 stent  . skin tags removed    . TOTAL KNEE  ARTHROPLASTY     Right  . TOTAL KNEE ARTHROPLASTY Left 07/31/2016   Procedure: LEFT TOTAL KNEE ARTHROPLASTY;  Surgeon: Marybelle Killings, MD;  Location: [place];  Service: Orthopedics;  Laterality: Left;    There were no vitals filed for this visit.      Subjective Assessment - 09/09/16 0948    Subjective Patient arrives stating that he is doing great in general; he is still concerned mostly about his extension ROM, not so much flexion. He states that stairs are going well, it is not hard but he does take his time especially going down. He rates himself as being 90/100 on a subjective scale.    Pertinent History on beta-blockers, nitro as needed, HTN, OSA, A-fib medically managed, hx gout, hx MI, mood disorder, L ankle surgery, R knee replacement    How long can you sit comfortably? 10/30- unlimited    How long can you stand comfortably? 10/30- at least an hour    How long can you walk comfortably? 10/30- casual walking, several blocks with no cane    Patient Stated Goals get back to PLOF    Currently in Pain? No/denies            [facility] PT Assessment - 09/09/16 0001  Observation/Other Assessments   Focus on Therapeutic Outcomes (FOTO)  25% limited      AROM   Right Knee Extension 4   Right Knee Flexion 91   Left Knee Extension 7   Left Knee Flexion 97     Strength   Right Hip Flexion 4+/5   Right Hip Extension 5/5   Right Hip ABduction 5/5   Left Hip Flexion 4+/5   Left Hip Extension 5/5   Left Hip ABduction 5/5   Right Knee Flexion 5/5   Right Knee Extension 5/5   Left Knee Flexion 5/5   Left Knee Extension 5/5   Right Ankle Dorsiflexion 5/5   Left Ankle Dorsiflexion 5/5     6 minute walk test results    Aerobic Endurance Distance Walked 791   Endurance additional comments 3MWT, 1.45ms      High Level Balance   High Level Balance Comments TUG 8 seconds with no device                      [facility] Adult PT Treatment/Exercise - 09/09/16 0001       Knee/Hip Exercises: Stretches   Active Hamstring Stretch Both;3 reps;30 seconds   Active Hamstring Stretch Limitations stairs    Knee: Self-Stretch to increase Flexion Left   Knee: Self-Stretch Limitations 15x10 second holds    Gastroc Stretch Both;3 reps;30 seconds   Gastroc Stretch Limitations slant board                 PT Education - 09/09/16 1040    Education provided Yes   Education Details progress with skilled PT services, POC moving forward, reviewed new HEP exercises/new HEP focus    Person(s) Educated Patient   Methods Explanation;Demonstration;Handout   Comprehension Verbalized understanding;Need further instruction          PT Short Term Goals - 09/09/16 1007      PT SHORT TERM GOAL #1   Title Patient to demonstrate ROM L knee of 0-115 degrees in order to improve gait mechanics and general functional task performance    Baseline 10/30- 7-97, best 5-100   Time 3   Period Weeks   Status On-going     PT SHORT TERM GOAL #2   Title Patient will be able to ambulate unlimited distances with pain L knee no more than 1/10 in order to improve general mobility and community access    Baseline 10/30- no pain but might have trouble walking longer distances    Time 3   Period Weeks   Status Partially Met     PT SHORT TERM GOAL #3   Title Patient to be independent in management strategies for correct icing technique, patella/scar massage, and retrograde massage in order to assist with progression of status and improve self-efficacy in managing condition    Baseline 10/30- reports compliance    Time 3   Period Weeks   Status Achieved     PT SHORT TERM GOAL #4   Title Patient to be independent in correctly and consistently performing appropriate HEP, to be updated PRN    Baseline 10/30- "they could be better, I get so busy", tries to do something every day though    Time 1   Period Weeks   Status Partially Met           PT Long Term Goals - 09/09/16 1010       PT LONG TERM GOAL #1   Title Patient to demonstrate  strength 5/5 in order to improve functional task performance and reduce pain with extended activities    Time 6   Period Weeks   Status Achieved     PT LONG TERM GOAL #2   Title Patient to be able to ambulate 662f during 3MWT to demonstrate improved mobility and safe community access    Time 6   Period Weeks   Status Achieved     PT LONG TERM GOAL #3   Title Patient to be able to maintain SLS for 30 seconds on each LE in order to show reduced fall risk and improved balance skills    Baseline 12024-11-29 R 2 seconds, L 10 seconds    Time 6   Period Weeks   Status On-going     PT LONG TERM GOAL #4   Title Patient to be able to reciprocally ascend/descend full flight of stairs with U railing, minimal unsteadiness, good eccentric control in order to improve home and community access skills    Time 6   Period Weeks   Status Achieved               Plan - [id number]    Clinical Impression Statement Re-assessment performed today. Patient is making excellent progress with skilled PT services, with his main remaining limitations being L knee ROM and general functional balance skills- however, note that even though it remains stiff, his ROM is better than that in his R knee. He reports subjective ease with the majority of activities and appears fairly unlimited at this time. Recommend a short extension of skilled PT services with transition to advanced ROM/balance HEP before discharge to independent exercise program.    Rehab Potential Excellent   Clinical Impairments Affecting Rehab Potential good motivation to participate in skilled PT services    PT Frequency 1x / week   PT Duration 3 weeks   PT Treatment/Interventions ADLs/Self Care Home Management;Biofeedback;Cryotherapy;Moist Heat;DME Instruction;Gait training;Stair training;Functional mobility training;Therapeutic activities;Therapeutic exercise;Balance  training;Neuromuscular re-education;Patient/family education;Manual techniques;Scar mobilization;Passive range of motion;Energy conservation;Taping   PT Next Visit Plan Start session with bicycle; continue focus on ROM and balance. Likely DC to advanced HEP in 3 sessions.    PT Home Exercise Plan 10/10: quad sets and knee flexion stretch (patient refused handout as he already has these); 10/13:  addition of knee extension hang with light weight on knee and knee drives given. 111/29/2024 prone knee hangs, knee extensions on chairs, assisted knee AAROM flexion, quad stretch    Consulted and Agree with Plan of Care Patient      Patient will benefit from skilled therapeutic intervention in order to improve the following deficits and impairments:  Abnormal gait, Decreased skin integrity, Improper body mechanics, Pain, Decreased coordination, Decreased mobility, Decreased scar mobility, Increased muscle spasms, Postural dysfunction, Decreased range of motion, Decreased strength, Hypomobility, Decreased balance, Difficulty walking, Increased edema, Impaired flexibility  Visit Diagnosis: Stiffness of left knee, not elsewhere classified  Localized edema  Difficulty in walking, not elsewhere classified  Unsteadiness on feet       G-Codes - 111/29/171046    Functional Assessment Tool Used Based on skilled clinical assessment of ROM, edema, strength, gait, balance    Functional Limitation Mobility: Walking and moving around   Mobility: Walking and Moving Around Current Status (([id number] At least 20 percent but less than 40 percent impaired, limited or restricted   Mobility: Walking and Moving Around Goal Status (([id number] At least 1 percent but less than 20 percent impaired, limited or  restricted      Problem List Patient Active Problem List   Diagnosis Date Noted  . 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 OSA (obstructive sleep apnea) 01/23/2012  . ESSENTIAL HYPERTENSION, BENIGN 01/03/2010  . CORONARY  ATHEROSCLEROSIS NATIVE CORONARY ARTERY 01/03/2010  . HYPERLIPIDEMIA-MIXED 05/08/2009  . OBESITY-MORBID (>100') 05/08/2009    Deniece Ree PT, DPT [place] [address] [place], [place], [address] Phone: [phone]   Fax:  [phone]  Name: TANDRE CONLY MRN: [id number] Date of Birth: January 03, 1951

## 2016-09-11 ENCOUNTER — Ambulatory Visit (HOSPITAL_COMMUNITY): Payer: No Typology Code available for payment source | Admitting: Physical Therapy

## 2016-09-13 ENCOUNTER — Encounter (HOSPITAL_COMMUNITY): Payer: No Typology Code available for payment source | Admitting: Physical Therapy

## 2016-09-16 ENCOUNTER — Ambulatory Visit (HOSPITAL_COMMUNITY): Payer: No Typology Code available for payment source | Attending: Orthopaedic Surgery | Admitting: Physical Therapy

## 2016-09-16 DIAGNOSIS — M25662 Stiffness of left knee, not elsewhere classified: Secondary | ICD-10-CM | POA: Insufficient documentation

## 2016-09-16 DIAGNOSIS — R262 Difficulty in walking, not elsewhere classified: Secondary | ICD-10-CM | POA: Diagnosis present

## 2016-09-16 DIAGNOSIS — R2681 Unsteadiness on feet: Secondary | ICD-10-CM | POA: Insufficient documentation

## 2016-09-16 DIAGNOSIS — R600 Localized edema: Secondary | ICD-10-CM | POA: Insufficient documentation

## 2016-09-16 NOTE — Therapy (Signed)
[name], [place], [address] Phone: [phone]   Fax:  [phone]  Physical Therapy Treatment  Patient Details  Name: Evan Warner MRN: [id number] Date of Birth: 1950/11/18 Referring Provider: Rodell Perna   Encounter Date: 09/16/2016      PT End of Session - 09/16/16 1106    Visit Number 11   Number of Visits 11   Date for PT Re-Evaluation 09/30/16   Authorization Type [employer] Choice (this insurance needs G-codes; done 10th session)   Authorization Time Period 08/20/16 to 10/01/16   Authorization - Visit Number 11   Authorization - Number of Visits 11   PT Start Time 1036   PT Stop Time 1110   PT Time Calculation (min) 34 min   Activity Tolerance Patient tolerated treatment well   Behavior During Therapy [facility] for tasks assessed/performed      Past Medical History:  Diagnosis Date  . A-fib ([place])    chronic afib history per 02/20/16 [facility] note  . Arthritis   . Coronary atherosclerosis of native coronary artery    DES ramus 2005, LVEF 55%.Takes Pradaxa daily  . Enlarged prostate    takes Flomax nightly  . Essential hypertension, benign    takes Atenolol,Lisinipril-HCTZ,and Procardia daily  . GERD (gastroesophageal reflux disease)    takes Pantoprazole daily  . Gout    takes Allopurinol daily  . History of kidney stones   . Hyperlipidemia    takes Simvastatin daily  . Joint pain   . Joint swelling   . MI (myocardial infarction)    [facility]  . Mood disorder ([place])    takes Fluoxetine daily  . Nocturia   . OSA (obstructive sleep apnea)    On CPAP  . Peripheral edema    occasionally  . Pneumonia    history of   . Urinary frequency     Past Surgical History:  Procedure Laterality Date  . ANKLE ARTHROSCOPY     Left  . BACK SURGERY    . CARDIAC CATHETERIZATION  2005  . CORONARY ANGIOPLASTY     1 stent  . skin tags removed    . TOTAL KNEE ARTHROPLASTY      Right  . TOTAL KNEE ARTHROPLASTY Left 07/31/2016   Procedure: LEFT TOTAL KNEE ARTHROPLASTY;  Surgeon: Marybelle Killings, MD;  Location: [place];  Service: Orthopedics;  Laterality: Left;    There were no vitals filed for this visit.      Subjective Assessment - 09/16/16 1041    Subjective Pt has no complaints.  Pt has no difficulty except for increasing his ROM.  He played 18 holes of golf and did better than he had in years.  Sore the next day but tolerable.     Pertinent History on beta-blockers, nitro as needed, HTN, OSA, A-fib medically managed, hx gout, hx MI, mood disorder, L ankle surgery, R knee replacement    How long can you sit comfortably? 10/30- unlimited    How long can you stand comfortably? 10/30- at least an hour    How long can you walk comfortably? 10/30- casual walking, several blocks with no cane    Patient Stated Goals get back to PLOF    Pain Onset 1 to 4 weeks ago            [facility] PT Assessment - 09/16/16 0001      Assessment   Medical Diagnosis L total knee replacement  Referring Provider Rodell Perna    Onset Date/Surgical Date 07/31/16   Next MD Visit Dr. Lorin Mercy early November      Precautions   Precaution Comments fall, L total knee      Prior Function   Level of Independence Independent;Independent with basic ADLs;Independent with gait;Independent with transfers   Vocation Retired     Observation/Other Assessments   Focus on Therapeutic Outcomes (FOTO)  13% limited  was 56 % limited      Functional Tests   Functional tests Single leg stance;Sit to Stand     Single Leg Stance   Comments Lt  30; RT 5      Sit to Stand   Comments 10.93 age bracket is 11.4      AROM   Right Knee Extension 5   Left Knee Extension 12   Left Knee Flexion 106     Strength   Right Hip Flexion 5/5   Right Hip ABduction 5/5   Left Hip Flexion 5/5   Left Hip ABduction 5/5   Right Knee Flexion 5/5   Right Knee Extension 5/5   Left Knee Flexion 5/5   Left Knee  Extension 5/5   Right Ankle Dorsiflexion 5/5   Left Ankle Dorsiflexion 5/5     Ambulation/Gait   Gait Comments --     6 minute walk test results    Aerobic Endurance Distance Walked 577   Endurance additional comments 3MWT, walker      High Level Balance   High Level Balance Comments --                     [facility] Adult PT Treatment/Exercise - 09/16/16 0001      Knee/Hip Exercises: Stretches   Active Hamstring Stretch Both;3 reps;30 seconds   Active Hamstring Stretch Limitations stairs    Knee: Self-Stretch to increase Flexion Left   Knee: Self-Stretch Limitations 15x10 second holds    Press photographer Both;3 reps;30 seconds   Gastroc Stretch Limitations slant board      Knee/Hip Exercises: Standing   Heel Raises 15 reps   Knee Flexion 15 reps   Terminal Knee Extension Limitations 15   Step Down Left;10 reps   Step Down Limitations slow    SLS x5      Knee/Hip Exercises: Supine   Quad Sets 15 reps   Heel Slides 10 reps                  PT Short Term Goals - 09/16/16 1107      PT SHORT TERM GOAL #1   Title Patient to demonstrate ROM L knee of 0-115 degrees in order to improve gait mechanics and general functional task performance    Baseline 10/30- 7-97, best 5-100 today 5-105   Time 3   Period Weeks   Status On-going     PT SHORT TERM GOAL #2   Title Patient will be able to ambulate unlimited distances with pain L knee no more than 1/10 in order to improve general mobility and community access    Baseline Played 18 holes of golf    Time 3   Period Weeks   Status Achieved     PT SHORT TERM GOAL #3   Title Patient to be independent in management strategies for correct icing technique, patella/scar massage, and retrograde massage in order to assist with progression of status and improve self-efficacy in managing condition    Baseline 10/30- reports compliance  Time 3   Period Weeks   Status Achieved     PT SHORT TERM GOAL #4   Title  Patient to be independent in correctly and consistently performing appropriate HEP, to be updated PRN    Baseline 10/30- "they could be better, I get so busy", tries to do something every day though    Time 1   Period Weeks   Status Achieved           PT Long Term Goals - 24-Sep-2016 1109      PT LONG TERM GOAL #1   Title Patient to demonstrate strength 5/5 in order to improve functional task performance and reduce pain with extended activities    Time 6   Period Weeks   Status Achieved     PT LONG TERM GOAL #2   Title Patient to be able to ambulate 634f during 3MWT to demonstrate improved mobility and safe community access    Baseline 738 feet in 3 minutes    Time 6   Period Weeks   Status Achieved     PT LONG TERM GOAL #3   Title Patient to be able to maintain SLS for 30 seconds on each LE in order to show reduced fall risk and improved balance skills    Baseline 10/30- R 2 seconds, L 10 seconds    Time 6   Period Weeks   Status Partially Met     PT LONG TERM GOAL #4   Title Patient to be able to reciprocally ascend/descend full flight of stairs with U railing, minimal unsteadiness, good eccentric control in order to improve home and community access skills    Time 6   Period Weeks   Status Achieved               Plan - [id number]    Clinical Impression Statement Pt going to MD today.  Pt is happy with functiona status and is I in his HEP.  Together therapist and pt believe that pt is ready to be discharged to HEP .     Rehab Potential Excellent   Clinical Impairments Affecting Rehab Potential good motivation to participate in skilled PT services    PT Frequency 1x / week   PT Duration 3 weeks   PT Treatment/Interventions ADLs/Self Care Home Management;Biofeedback;Cryotherapy;Moist Heat;DME Instruction;Gait training;Stair training;Functional mobility training;Therapeutic activities;Therapeutic exercise;Balance training;Neuromuscular re-education;Patient/family  education;Manual techniques;Scar mobilization;Passive range of motion;Energy conservation;Taping   PT Next Visit Plan Pt to be discharged from formal therapt    PT Home Exercise Plan 10/10: quad sets and knee flexion stretch (patient refused handout as he already has these); 10/13:  addition of knee extension hang with light weight on knee and knee drives given. 10/30: prone knee hangs, knee extensions on chairs, assisted knee AAROM flexion, quad stretch    Consulted and Agree with Plan of Care Patient      Patient will benefit from skilled therapeutic intervention in order to improve the following deficits and impairments:  Abnormal gait, Decreased skin integrity, Improper body mechanics, Pain, Decreased coordination, Decreased mobility, Decreased scar mobility, Increased muscle spasms, Postural dysfunction, Decreased range of motion, Decreased strength, Hypomobility, Decreased balance, Difficulty walking, Increased edema, Impaired flexibility  Visit Diagnosis: Stiffness of left knee, not elsewhere classified  Localized edema  Difficulty in walking, not elsewhere classified  Unsteadiness on feet       G-Codes - 1Nov 14, 20171116    Functional Assessment Tool Used based on ROM, edema, gait and strength  Functional Limitation Mobility: Walking and moving around   Mobility: Walking and Moving Around Goal Status [phone]) At least 1 percent but less than 20 percent impaired, limited or restricted   Mobility: Walking and Moving Around Discharge Status [phone]) At least 1 percent but less than 20 percent impaired, limited or restricted      Problem List Patient Active Problem List   Diagnosis Date Noted  . 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 OSA (obstructive sleep apnea) 01/23/2012  . ESSENTIAL HYPERTENSION, BENIGN 01/03/2010  . CORONARY ATHEROSCLEROSIS NATIVE CORONARY ARTERY 01/03/2010  . HYPERLIPIDEMIA-MIXED 05/08/2009  . OBESITY-MORBID (>100') 05/08/2009   Rayetta Humphrey, PT  CLT [phone] 09/16/2016, 11:17 AM  [place], [place], [address] Phone: [phone]   Fax:  [phone]  Name: Evan Warner MRN: [id number] Date of Birth: 07/19/1951  PHYSICAL THERAPY DISCHARGE SUMMARY  Visits from Start of Care: 11  Current functional level related to goals / functional outcomes: See above   Remaining deficits: See above   Education / Equipment: HEP  Plan: Patient agrees to discharge.  Patient goals were partially met. Patient is being discharged due to being pleased with the current functional level.  ?????       Rayetta Humphrey, [place] CLT [phone]

## 2016-09-17 ENCOUNTER — Ambulatory Visit (INDEPENDENT_AMBULATORY_CARE_PROVIDER_SITE_OTHER): Payer: No Typology Code available for payment source | Admitting: Orthopaedic Surgery

## 2016-09-17 ENCOUNTER — Encounter (INDEPENDENT_AMBULATORY_CARE_PROVIDER_SITE_OTHER): Payer: Self-pay | Admitting: Orthopaedic Surgery

## 2016-09-17 VITALS — BP 170/95 | HR 57

## 2016-09-17 DIAGNOSIS — Z96652 Presence of left artificial knee joint: Secondary | ICD-10-CM

## 2016-09-17 NOTE — Patient Instructions (Signed)
Continue home exercise program that the physical therapist has given you.    Wear thigh-high compression stocking to help decrease swelling in the leg and knee.

## 2016-09-17 NOTE — Progress Notes (Signed)
Office Visit Note   Patient: Evan Warner           Date of Birth: 09/01/1951           MRN: [id number] Visit Date: 09/17/2016 Requested by: No referring provider defined for this encounter. PCP: Pcp Not In System  Subjective: Chief Complaint  Patient presents with  . Left Knee - Routine Post Op    Patient is here s/p left TKA on 07/31/16.  He is doing well, says PT has dismissed him.  He is not taking any meds for this.                 Review of Systems  Constitutional: Negative.   HENT: Negative.   Respiratory: Negative.   Gastrointestinal: Negative.   Neurological: Negative.   Psychiatric/Behavioral: Negative.      Assessment & Plan: Visit Diagnoses:  1. Presence of left artificial knee joint     Plan: Physical therapist is recommending discharged from formal PT. Patient will continue home exercise program. Follow-up the office in 6 weeks for recheck.  Follow-Up Instructions: Return in about 6 weeks (around 10/29/2016).   Orders:  No orders of the defined types were placed in this encounter.  No orders of the defined types were placed in this encounter.     Procedures: No procedures performed   Clinical Data: No additional findings.  Objective: Vital Signs: BP (!) 170/95   Pulse (!) 57   Physical Exam  Constitutional: No distress.  HENT:  Head: Normocephalic.  Eyes: EOM are normal. Pupils are equal, round, and reactive to light.  Neck: Normal range of motion.  Pulmonary/Chest: Breath sounds normal.  Skin: Skin is warm.    Ortho Exam Gait is normal. Left knee range of motion he just about gets out to full extension. Flexion to about 90-95. Does have some knee swelling. Calf nontender. Specialty Comments:  No specialty comments available.  Imaging: No results found.   PMFS History: Patient Active Problem List   Diagnosis Date Noted  . 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 OSA (obstructive sleep apnea) 01/23/2012  . ESSENTIAL HYPERTENSION,  BENIGN 01/03/2010  . CORONARY ATHEROSCLEROSIS NATIVE CORONARY ARTERY 01/03/2010  . HYPERLIPIDEMIA-MIXED 05/08/2009  . OBESITY-MORBID (>100') 05/08/2009   Past Medical History:  Diagnosis Date  . A-fib ([facility])    chronic afib history per [facility] note  . Arthritis   . Coronary atherosclerosis of native coronary artery    DES ramus 2005, LVEF 55%.Takes Pradaxa daily  . Enlarged prostate    takes Flomax nightly  . Essential hypertension, benign    takes Atenolol,Lisinipril-HCTZ,and Procardia daily  . GERD (gastroesophageal reflux disease)    takes Pantoprazole daily  . Gout    takes Allopurinol daily  . History of kidney stones   . Hyperlipidemia    takes Simvastatin daily  . Joint pain   . Joint swelling   . [facility]  . Mood disorder ([facility])    takes Fluoxetine daily  . Nocturia   . OSA (obstructive sleep apnea)    On CPAP  . Peripheral edema    occasionally  . Pneumonia    history of   . Urinary frequency     No family history on file.  Past Surgical History:  Procedure Laterality Date  . ANKLE ARTHROSCOPY     Left  . BACK SURGERY    . CARDIAC CATHETERIZATION  2005  . CORONARY ANGIOPLASTY  1 stent  . skin tags removed    . TOTAL KNEE ARTHROPLASTY     Right  . TOTAL KNEE ARTHROPLASTY Left 07/31/2016   Procedure: LEFT TOTAL KNEE ARTHROPLASTY;  Surgeon: Eldred MangesMark C Yates, MD;  Location: [facility];  Service: Orthopedics;  Laterality: Left;   Social History   Occupational History  . Not on file.   Social History Main Topics  . Smoking status: Former Smoker    Types: Cigarettes  . Smokeless tobacco: Never Used     Comment: quit smoking in 2005  . Alcohol use Yes     Comment: occasionally beer  . Drug use: No  . Sexual activity: Not on file

## 2016-09-18 ENCOUNTER — Encounter (HOSPITAL_COMMUNITY): Payer: No Typology Code available for payment source | Admitting: Physical Therapy

## 2016-09-20 ENCOUNTER — Encounter (HOSPITAL_COMMUNITY): Payer: No Typology Code available for payment source | Admitting: Physical Therapy

## 2016-09-23 ENCOUNTER — Encounter (HOSPITAL_COMMUNITY): Payer: No Typology Code available for payment source | Admitting: Physical Therapy

## 2016-09-25 ENCOUNTER — Encounter (HOSPITAL_COMMUNITY): Payer: No Typology Code available for payment source | Admitting: Physical Therapy

## 2016-09-27 ENCOUNTER — Encounter (HOSPITAL_COMMUNITY): Payer: No Typology Code available for payment source | Admitting: Physical Therapy

## 2016-09-30 ENCOUNTER — Encounter (HOSPITAL_COMMUNITY): Payer: No Typology Code available for payment source | Admitting: Physical Therapy

## 2016-10-02 ENCOUNTER — Encounter (HOSPITAL_COMMUNITY): Payer: No Typology Code available for payment source | Admitting: Physical Therapy

## 2016-10-11 ENCOUNTER — Telehealth (HOSPITAL_COMMUNITY): Payer: Self-pay

## 2016-10-11 NOTE — Telephone Encounter (Signed)
10/11/16 I spoke with Dian[place] at [facility] -- our paperwork for authorization was received.... Pt was approved for 36 visits and came to see us 11 visits.  The validity period was 1/24 thru 09/03/16 and did go over that by 4 visits.  I called Chrystal back at [facility]...  We have been working on getting the information on his visits.... I gave her all of this information.  [phone] is Chrystal's #

## 2016-10-29 ENCOUNTER — Ambulatory Visit (INDEPENDENT_AMBULATORY_CARE_PROVIDER_SITE_OTHER): Payer: No Typology Code available for payment source | Admitting: Orthopaedic Surgery

## 2017-03-05 ENCOUNTER — Telehealth (HOSPITAL_COMMUNITY): Payer: Self-pay

## 2017-03-05 NOTE — Telephone Encounter (Signed)
03/05/17 Evan Warner with [facility] called and asked for dates of service for patient to get this bill paid.  She stated that she was going to speak to her manager about this because we had problems getting verification for service.  I gave her our fax # so she could fax information to [place].

## 2021-03-26 ENCOUNTER — Other Ambulatory Visit (HOSPITAL_COMMUNITY): Payer: Self-pay | Admitting: Neurology

## 2021-03-26 ENCOUNTER — Other Ambulatory Visit: Payer: Self-pay | Admitting: Neurology

## 2021-03-26 DIAGNOSIS — R471 Dysarthria and anarthria: Secondary | ICD-10-CM

## 2021-04-06 ENCOUNTER — Telehealth (HOSPITAL_COMMUNITY): Payer: Self-pay | Admitting: Speech Pathology

## 2021-04-06 NOTE — Telephone Encounter (Signed)
Irving Burton call back from MD office she has spoken with [facility] - She has re-faxed the request for ST and called the [facility] l/m. 04/06/21

## 2021-04-18 ENCOUNTER — Ambulatory Visit (HOSPITAL_COMMUNITY): Payer: No Typology Code available for payment source | Attending: Neurology | Admitting: Speech Pathology

## 2021-04-18 ENCOUNTER — Other Ambulatory Visit: Payer: Self-pay

## 2021-04-18 DIAGNOSIS — R1312 Dysphagia, oropharyngeal phase: Secondary | ICD-10-CM | POA: Diagnosis present

## 2021-04-18 DIAGNOSIS — R471 Dysarthria and anarthria: Secondary | ICD-10-CM | POA: Diagnosis not present

## 2021-04-20 ENCOUNTER — Encounter (HOSPITAL_COMMUNITY): Payer: Self-pay | Admitting: Speech Pathology

## 2021-04-20 ENCOUNTER — Other Ambulatory Visit (HOSPITAL_COMMUNITY): Payer: Self-pay | Admitting: Specialist

## 2021-04-20 DIAGNOSIS — R471 Dysarthria and anarthria: Secondary | ICD-10-CM

## 2021-04-20 NOTE — Therapy (Signed)
[facility] [address] [place], [place], [address] Phone: [phone]   Fax:  [phone]  Speech Language Pathology Evaluation  Patient Details  Name: Evan Warner MRN: [id number] Date of Birth: 1951-10-16 Referring Provider (SLP): Dr. Beryle Beams   Encounter Date: 04/18/2021   End of Session - 04/20/21 0846     Visit Number 1    Number of Visits 12    Authorization Type [facility] authorization pending    SLP Start Time 1514    SLP Stop Time  1600    SLP Time Calculation (min) 46 min    Activity Tolerance Patient tolerated treatment well             Past Medical History:  Diagnosis Date   A-fib (HCC)    chronic afib history per 02/20/16 [facility] cardioloy note   Arthritis    Coronary atherosclerosis of native coronary artery    DES ramus 2005, LVEF 55%.Takes Pradaxa daily   Enlarged prostate    takes Flomax nightly   Essential hypertension, benign    takes Atenolol,Lisinipril-HCTZ,and Procardia daily   GERD (gastroesophageal reflux disease)    takes Pantoprazole daily   Gout    takes Allopurinol daily   History of kidney stones    Hyperlipidemia    takes Simvastatin daily   Joint pain    Joint swelling    MI (myocardial infarction) (HCC)    [place] 2005   Mood disorder (HCC)    takes Fluoxetine daily   Nocturia    OSA (obstructive sleep apnea)    On CPAP   Peripheral edema    occasionally   Pneumonia    history of    Urinary frequency     Past Surgical History:  Procedure Laterality Date   ANKLE ARTHROSCOPY     Left   BACK SURGERY     CARDIAC CATHETERIZATION  2005   CORONARY ANGIOPLASTY     1 stent   skin tags removed     TOTAL KNEE ARTHROPLASTY     Right   TOTAL KNEE ARTHROPLASTY Left 07/31/2016   Procedure: LEFT TOTAL KNEE ARTHROPLASTY;  Surgeon: Eldred Manges, MD;  Location: [facility] OR;  Service: Orthopedics;  Laterality: Left;    There were no vitals filed for this visit.        SLP Evaluation [facility] - 04/20/21 0001       SLP Visit Information   SLP Received On 04/18/21    Referring Provider (SLP) Dr. Beryle Beams    Onset Date ~ one month ago (03/20/21)    Medical Diagnosis R47.1 Dysarthria and Anarthria      General Information   HPI Evan Warner is a 70 y/o male with hx of asthma, GERD, myocardial infarction who has recently been informally diagnosed with Myasthenia Gravis (MG). His symptoms began approximately one month ago when he suddently began having double vision. He went to the [place] where he had a lengthy appointment and was provided glasses that correct double vision. [facility] and he was then seen by Dr. Gerilyn Pilgrim who referred him to speech therapy for dysarthria.    Mobility Status Ambulated independently to room      Balance Screen   Has the patient fallen in the past 6 months No      Prior Functional Status   Cognitive/Linguistic Baseline Within functional limits      Cognition   Overall Cognitive Status Within  Functional Limits for tasks assessed      Auditory Comprehension   Overall Auditory Comprehension Appears within functional limits for tasks assessed      Visual Recognition/Discrimination   Discrimination Not tested      Reading Comprehension   Reading Status --   Vision is impaired     Expression   Primary Mode of Expression Verbal      Verbal Expression   Overall Verbal Expression Appears within functional limits for tasks assessed    Initiation No impairment      Oral Motor/Sensory Function   Overall Oral Motor/Sensory Function Impaired   Generalized weakness primary LEFT side   Labial Symmetry Abnormal symmetry left    Labial Sensation Within Functional Limits    Labial Coordination Reduced    Lingual ROM --   Reduced strength   Facial ROM Reduced left    Facial Symmetry Left droop;Left drooping eyelid    Facial Strength Reduced    Facial Sensation Within Functional Limits    Facial  Coordination Reduced      Motor Speech   Overall Motor Speech Impaired    Respiration Impaired    Articulation Impaired    Level of Impairment Word    Intelligibility Intelligibility reduced    Word 75-100% accurate    Phrase 50-74% accurate    Sentence 50-74% accurate    Conversation 50-74% accurate    Motor Planning Witnin functional limits                             SLP Education - 04/18/21 0845     Education Details Discussed energy conservation and communication strategies including: slow rate, over-articulation, more frequent pauses with more breathing    Person(s) Educated Patient    Methods Handout;Explanation    Comprehension Verbalized understanding;Returned demonstration              SLP Short Term Goals - 04/18/21 0900       SLP SHORT TERM GOAL #1   Title Pt will utilize speech intelligibility strategies (over articulation, slowed rate, and more frequent pauses) at the sentence/conversation level with 90% acc and min cues.    Baseline fluctuates towards the end of a long passage and conversation decreasing to 60-70% at times    Time 8    Period Weeks    Status New      SLP SHORT TERM GOAL #2   Title Pt will increase speech intelligibility to [facility] for small group setting coversation and 1:1 phone calls with use of compensatory strategies as needed.    Baseline not tested upon eval    Time 8    Period Weeks    Status New      SLP SHORT TERM GOAL #3   Title Pt will participate in and report independent use of energy conversation strategies during the afternoons over 3 sessions    Baseline not testted upon eval    Time 8    Period Weeks    Status New              SLP Long Term Goals - 04/18/21 3267       SLP LONG TERM GOAL #1   Title Pt will increase speech intelligibility to [facility] for daily conversation and communication even in the afternoons and evenings as symptoms worsen per Pt report and SLP diagnostic assessment in sessions     Baseline not tested    Time  8    Period Weeks    Status New              Plan - 04/18/21 0908     Clinical Impression Statement Evan Warner presents with flaccid dysarthria that fluctuates in severity from mild to severe at times. When he initially starts talking his speech and articulatory precision is good, however, as he talks his articulators become more flaccid and his speech clartiy becomes garbled, at times he even used his hand to hold his lower lip up for additional support. Pt was asked to read "the Grandfather Passage" and his intelligibility ranged from initially being 100% to 50-70% intelligible towards the end of the passage. Pt also struggles to manage his secretions as time and conversation go on. When Pt is communicating in short phrases with frequent breaks or conversational turns his intelligibility is 100%. Note Pt does report anxiety and note increased volume as he escalates in communication very intent on getting his point across. SLP provided education regarding energy conservation and strategies to decrease stress and facilitate clear articulation and Pt demonstrated good stimulability with return demonstration. Pt reports significant difficulty with oral and pharyngeal swallowing; SLP will request an order for MBSS to objectively assess the swallowing function. Recommend initiate skilled ST for dysarthria 1x/wk for 8 weeks to reinforce strategies and ensure carryover of compensations.    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 [place], [place], [address] Phone: [phone]   Fax:  [phone]  Name: Evan Warner MRN: [id number] Date of Birth: 1951-04-07

## 2021-04-20 NOTE — Therapy (Deleted)
[facility] [address] [place], [place], [address] Phone: [phone]   Fax:  [phone]  Speech Language Pathology Evaluation  Patient Details  Name: Evan Warner MRN: [id number] Date of Birth: Jan 25, 1951 Referring Provider (SLP): Dr. Beryle Beams   Encounter Date: 04/18/2021   End of Session - 04/20/21 0846     Visit Number 1    Number of Visits 12    Authorization Type VA authorization pending    SLP Start Time 1514    SLP Stop Time  1600    SLP Time Calculation (min) 46 min    Activity Tolerance Patient tolerated treatment well             Past Medical History:  Diagnosis Date   A-fib (HCC)    chronic afib history per 02/20/16 [facility] cardioloy note   Arthritis    Coronary atherosclerosis of native coronary artery    DES ramus 2005, LVEF 55%.Takes Pradaxa daily   Enlarged prostate    takes Flomax nightly   Essential hypertension, benign    takes Atenolol,Lisinipril-HCTZ,and Procardia daily   GERD (gastroesophageal reflux disease)    takes Pantoprazole daily   Gout    takes Allopurinol daily   History of kidney stones    Hyperlipidemia    takes Simvastatin daily   Joint pain    Joint swelling    MI (myocardial infarction) (HCC)    [place] 2005   Mood disorder (HCC)    takes Fluoxetine daily   Nocturia    OSA (obstructive sleep apnea)    On CPAP   Peripheral edema    occasionally   Pneumonia    history of    Urinary frequency     Past Surgical History:  Procedure Laterality Date   ANKLE ARTHROSCOPY     Left   BACK SURGERY     CARDIAC CATHETERIZATION  2005   CORONARY ANGIOPLASTY     1 stent   skin tags removed     TOTAL KNEE ARTHROPLASTY     Right   TOTAL KNEE ARTHROPLASTY Left 07/31/2016   Procedure: LEFT TOTAL KNEE ARTHROPLASTY;  Surgeon: Eldred Manges, MD;  Location: [facility] OR;  Service: Orthopedics;  Laterality: Left;    There were no vitals filed for this visit.        SLP Evaluation [facility] - 04/18/21 0001       SLP Visit Information   SLP Received On 04/18/21    Referring Provider (SLP) Dr. Beryle Beams    Onset Date ~ one month ago (03/20/21)    Medical Diagnosis R47.1 Dysarthria and Anarthria      General Information   HPI Evan Warner is a 70 y/o male with hx of asthma, GERD, myocardial infarction who has recently been informally diagnosed with Myasthenia Gravis (MG). His symptoms began approximately one month ago when he suddently began having double vision. He went to the [place] where he had a lengthy appointment and was provided glasses that correct double vision. [facility] a [facility]ist [facility] and he was then seen by Dr. Gerilyn Pilgrim who referred him to speech therapy for dysarthria.    Mobility Status Ambulated independently to room      Balance Screen   Has the patient fallen in the past 6 months No      Prior Functional Status   Cognitive/Linguistic Baseline Within functional limits      Cognition   Overall Cognitive Status Within  Functional Limits for tasks assessed      Auditory Comprehension   Overall Auditory Comprehension Appears within functional limits for tasks assessed      Visual Recognition/Discrimination   Discrimination Not tested      Reading Comprehension   Reading Status --   Vision is impaired     Expression   Primary Mode of Expression Verbal      Verbal Expression   Overall Verbal Expression Appears within functional limits for tasks assessed    Initiation No impairment      Oral Motor/Sensory Function   Overall Oral Motor/Sensory Function Impaired   Generalized weakness primary LEFT side   Labial Symmetry Abnormal symmetry left    Labial Sensation Within Functional Limits    Labial Coordination Reduced    Lingual ROM --   Reduced strength   Facial ROM Reduced left    Facial Symmetry Left droop;Left drooping eyelid    Facial Strength Reduced    Facial Sensation Within Functional Limits    Facial  Coordination Reduced      Motor Speech   Overall Motor Speech Impaired    Respiration Impaired    Articulation Impaired    Level of Impairment Word    Intelligibility Intelligibility reduced    Word 75-100% accurate    Phrase 50-74% accurate    Sentence 50-74% accurate    Conversation 50-74% accurate    Motor Planning Witnin functional limits                             SLP Education - 04/18/21 0845     Education Details Discussed energy conservation and communication strategies including: slow rate, over-articulation, more frequent pauses with more breathing    Person(s) Educated Patient    Methods Handout;Explanation    Comprehension Verbalized understanding;Returned demonstration              SLP Short Term Goals - 04/18/21 0900       SLP SHORT TERM GOAL #1   Title Pt will utilize speech intelligibility strategies (over articulation, slowed rate, and more frequent pauses) at the sentence/conversation level with 90% acc and min cues.    Baseline fluctuates towards the end of a long passage and conversation decreasing to 60-70% at times    Time 8    Period Weeks    Status New      SLP SHORT TERM GOAL #2   Title Pt will increase speech intelligibility to [facility] for small group setting coversation and 1:1 phone calls with use of compensatory strategies as needed.    Baseline not tested upon eval    Time 8    Period Weeks    Status New      SLP SHORT TERM GOAL #3   Title Pt will participate in and report independent use of energy conversation strategies during the afternoons over 3 sessions    Baseline not testted upon eval    Time 8    Period Weeks    Status New              SLP Long Term Goals - 04/18/21 3267       SLP LONG TERM GOAL #1   Title Pt will increase speech intelligibility to [facility] for daily conversation and communication even in the afternoons and evenings as symptoms worsen per Pt report and SLP diagnostic assessment in sessions     Baseline not tested    Time  8    Period Weeks    Status New               Patient will benefit from skilled therapeutic intervention in order to improve the following deficits and impairments:   Dysarthria and anarthria    Problem List Patient Active Problem List   Diagnosis Date Noted   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 Romie Levee, CCC-SLP Speech Language Pathologist  Georgetta Haber 04/20/2021, 9:06 AM  [facility] [address] [place], [place], [address] Phone: [phone]   Fax:  [phone]  Name: Evan Warner MRN: [id number] Date of Birth: 1951/06/05

## 2021-04-23 ENCOUNTER — Ambulatory Visit (HOSPITAL_COMMUNITY)
Admission: RE | Admit: 2021-04-23 | Discharge: 2021-04-23 | Disposition: A | Discharge status: Home / Self Care | Payer: No Typology Code available for payment source | Source: Ambulatory Visit | Attending: Neurology | Admitting: Neurology

## 2021-04-23 ENCOUNTER — Ambulatory Visit (HOSPITAL_COMMUNITY): Payer: No Typology Code available for payment source

## 2021-04-23 ENCOUNTER — Encounter (HOSPITAL_COMMUNITY): Payer: Self-pay

## 2021-04-23 ENCOUNTER — Other Ambulatory Visit: Payer: Self-pay

## 2021-04-23 DIAGNOSIS — R471 Dysarthria and anarthria: Secondary | ICD-10-CM | POA: Insufficient documentation

## 2021-04-23 IMAGING — MR MR HEAD W/O CM
11 of 12 series · 40 of 48 positions shown · non-contrast
Comparison: None.

CLINICAL DATA: Dysarthria.

EXAM:
MRI HEAD WITHOUT CONTRAST
TECHNIQUE: Multiplanar, multiecho pulse sequences of the brain and surrounding
structures were obtained without intravenous contrast.

[Series 5: DWI · axial · 4.0mm · 0.88mm/px · z∈[-69,+65]mm · 4 of 36 slices shown (1 of 6)]
[im 1/36]
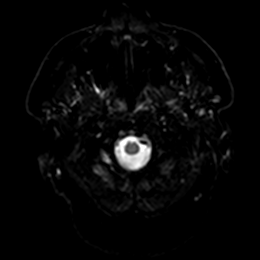
[im 12/36]
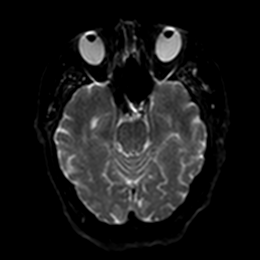
[im 24/36]
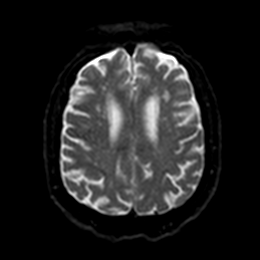
[im 36/36]
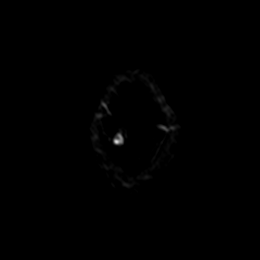

[Series 5: DWI · axial · 4.0mm · 0.88mm/px · z∈[-69,+65]mm · 4 of 36 slices shown (2 of 6)]
[im 1/36]
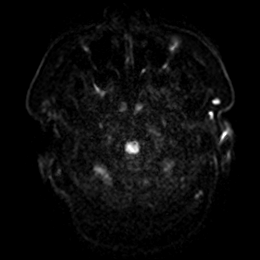
[im 12/36]
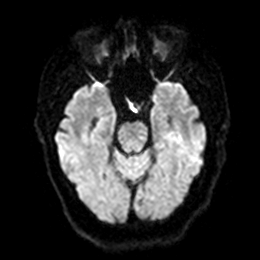
[im 24/36]
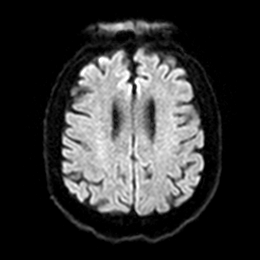
[im 36/36]
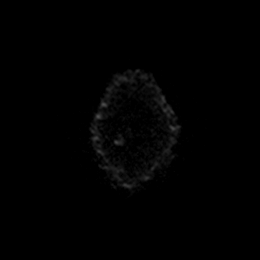

[Series 6: DWI · axial · 4.0mm · 0.88mm/px · z∈[-69,+65]mm · 4 of 36 slices shown (3 of 6)]
[im 1/36]
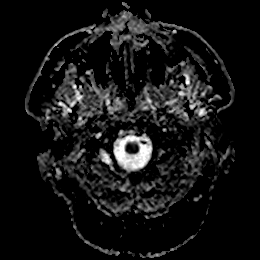
[im 12/36]
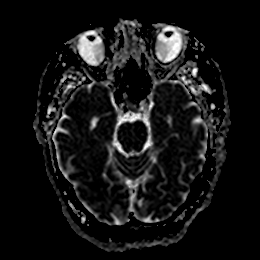
[im 24/36]
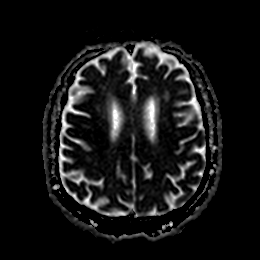
[im 36/36]
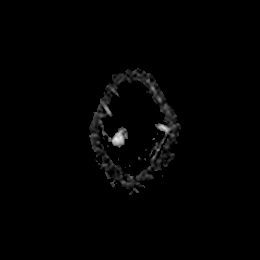

[Series 7: DWI · coronal · 5.0mm · 0.88mm/px · 3 of 28 slices shown (4 of 6)]
[im 1/28]
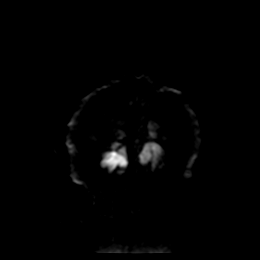
[im 14/28]
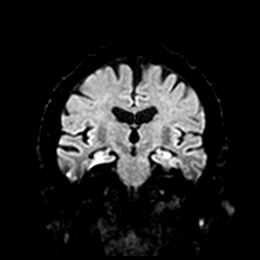
[im 28/28]
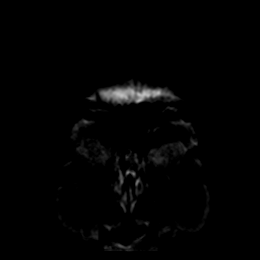

[Series 7: DWI · coronal · 5.0mm · 0.88mm/px · 4 of 28 slices shown (5 of 6)]
[im 1/28]
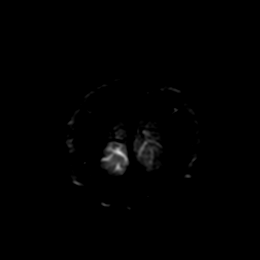
[im 10/28]
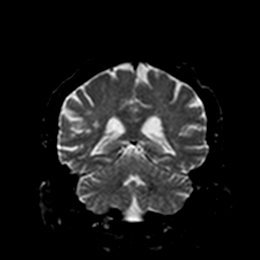
[im 19/28]
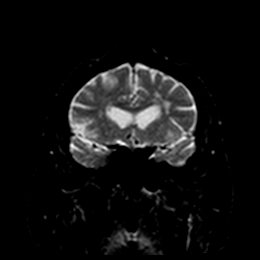
[im 28/28]
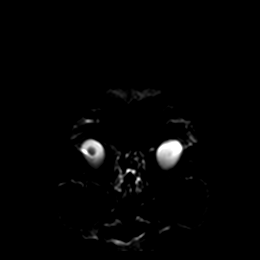

[Series 8: DWI · coronal · 5.0mm · 0.88mm/px · 4 of 28 slices shown (6 of 6)]
[im 1/28]
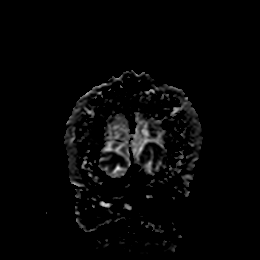
[im 10/28]
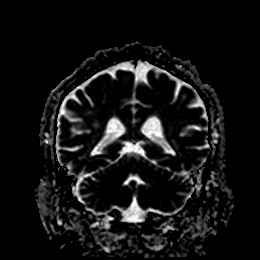
[im 19/28]
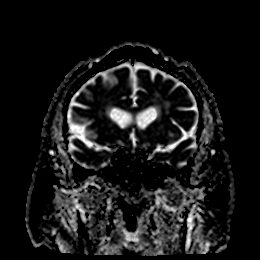
[im 28/28]
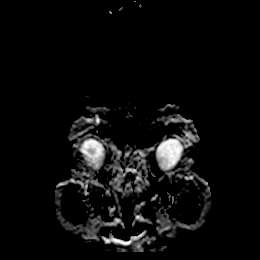

[Series 9: T1 · sagittal · 5.0mm · 0.94mm/px · 3 of 25 slices shown]
[im 1/25]
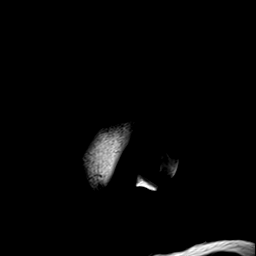
[im 13/25]
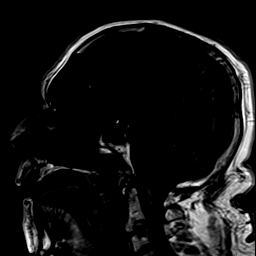
[im 25/25]
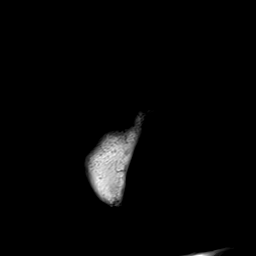

[Series 10: T2 · axial · 5.0mm · 0.72mm/px · z∈[-66,+62]mm · 3 of 20 slices shown (1 of 2)]
[im 1/20]
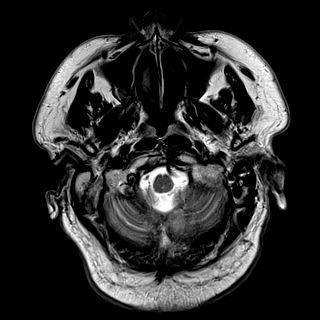
[im 10/20]
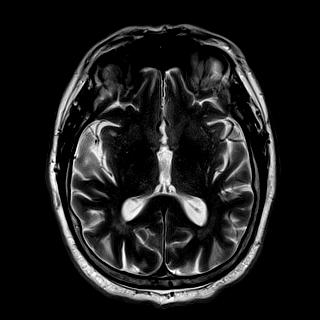
[im 20/20]
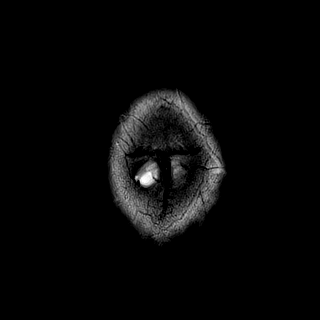

[Series 11: ax hemo · axial · 5.0mm · 0.86mm/px · z∈[-73,+65]mm · 3 of 25 slices shown]
[im 1/25]
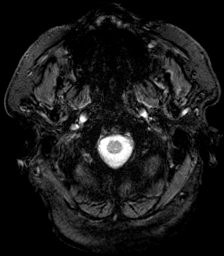
[im 13/25]
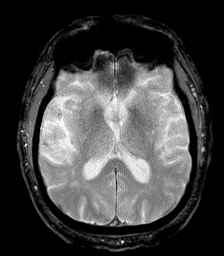
[im 25/25]
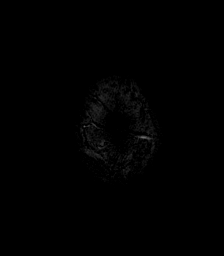

[Series 12: FLAIR · axial · 4.0mm · 0.43mm/px · z∈[-64,+56]mm · 4 of 32 slices shown]
[im 1/32]
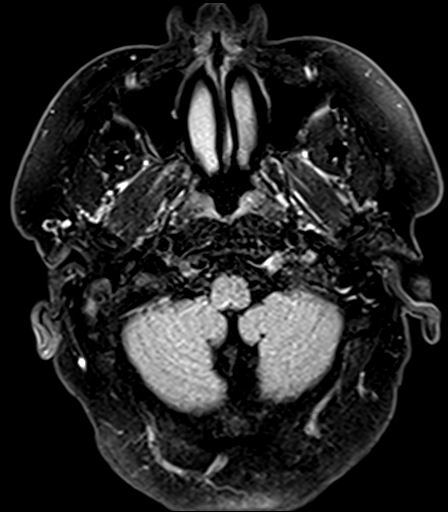
[im 11/32]
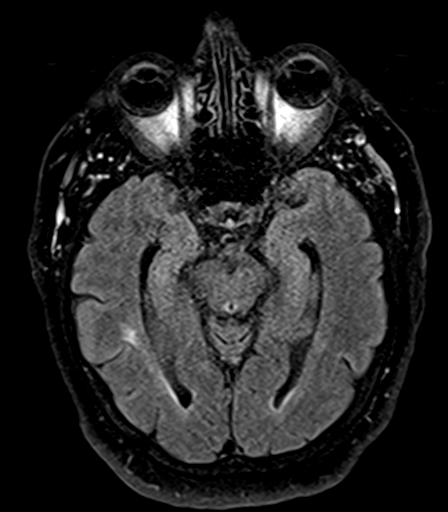
[im 21/32]
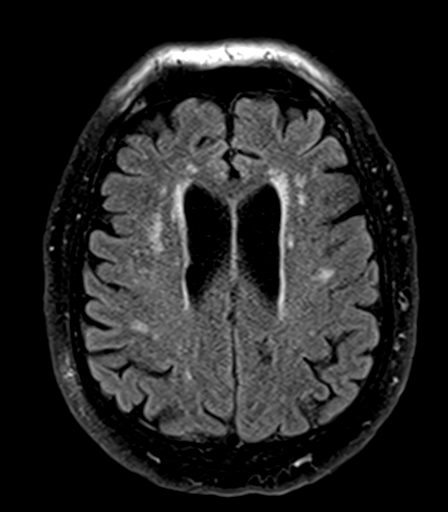
[im 32/32]
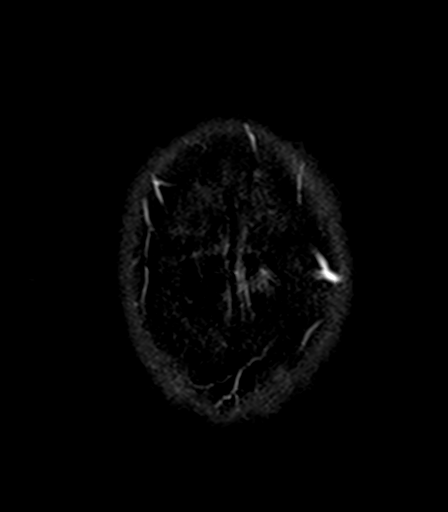

[Series 14: T2 · coronal · 5.0mm · 0.72mm/px · 4 of 28 slices shown (2 of 2)]
[im 1/28]
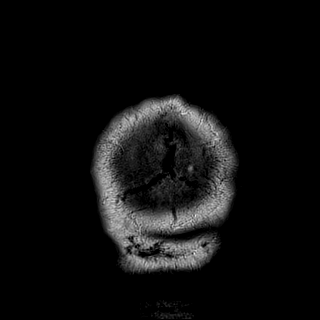
[im 10/28]
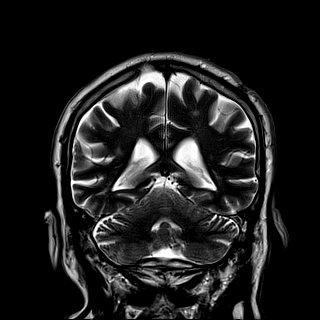
[im 19/28]
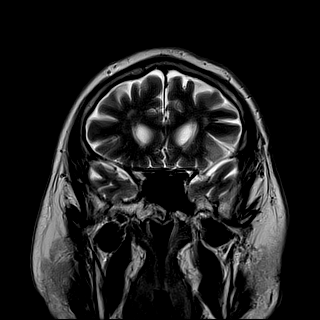
[im 28/28]
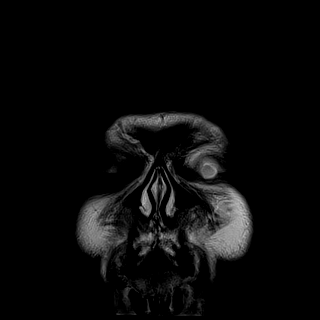

[40 of 48 positions shown; findings below may reference images not displayed]

FINDINGS: Brain: There is no evidence of an acute infarct, mass, midline
shift, or extra-axial fluid collection. Chronic microhemorrhages are
noted in the left thalamus and right temporal lobe. T2
hyperintensities in the cerebral white matter bilaterally are
nonspecific but compatible with moderately age advanced chronic
small vessel ischemic disease. There is a chronic lacunar infarct at
the posterior aspect of the left lentiform nucleus. There is mild
generalized cerebral atrophy.

Vascular: Major intracranial vascular flow voids are preserved.

Skull and upper cervical spine: Unremarkable bone marrow signal.

Sinuses/Orbits: Unremarkable orbits. Clear paranasal sinuses.
Moderate left mastoid effusion.

Other: None.
IMPRESSION: 1. No acute intracranial abnormality.
2. Moderate chronic small vessel ischemic disease.

## 2021-04-25 ENCOUNTER — Other Ambulatory Visit: Payer: Self-pay

## 2021-04-25 ENCOUNTER — Ambulatory Visit (HOSPITAL_COMMUNITY): Payer: No Typology Code available for payment source | Admitting: Speech Pathology

## 2021-04-25 DIAGNOSIS — R471 Dysarthria and anarthria: Secondary | ICD-10-CM | POA: Diagnosis not present

## 2021-04-27 ENCOUNTER — Encounter (HOSPITAL_COMMUNITY): Payer: Self-pay | Admitting: Speech Pathology

## 2021-04-27 ENCOUNTER — Ambulatory Visit (HOSPITAL_COMMUNITY)
Admission: RE | Admit: 2021-04-27 | Discharge: 2021-04-27 | Disposition: A | Discharge status: Home / Self Care | Payer: No Typology Code available for payment source | Source: Ambulatory Visit | Attending: Neurology | Admitting: Neurology

## 2021-04-27 ENCOUNTER — Other Ambulatory Visit: Payer: Self-pay

## 2021-04-27 ENCOUNTER — Ambulatory Visit (HOSPITAL_COMMUNITY): Payer: No Typology Code available for payment source | Admitting: Speech Pathology

## 2021-04-27 DIAGNOSIS — R471 Dysarthria and anarthria: Secondary | ICD-10-CM | POA: Diagnosis present

## 2021-04-27 DIAGNOSIS — R1312 Dysphagia, oropharyngeal phase: Secondary | ICD-10-CM

## 2021-04-27 IMAGING — RF DG SWALLOWING FUNCTION
13 of 21 series · 13 of 24 positions shown · non-contrast
Comparison: None

CLINICAL DATA: Dysphagia. Dysarthria, multiple sclerosis

EXAM:
MODIFIED BARIUM SWALLOW
TECHNIQUE: Different consistencies of barium were administered orally to the
patient by the Speech Pathologist. Imaging of the pharynx was
performed in the lateral projection. The radiologist was present in
the fluoroscopy room for this study, providing personal supervision.
FLUOROSCOPY TIME:  Fluoroscopy Time:  4 minutes 48 seconds
Radiation Exposure Index (if provided by the fluoroscopic device):
25.7 mGy
Number of Acquired Spot Images: multiple fluoroscopic screen
captures

[Series 1: before po · 1 of 57 frames shown]
[frame 9/57]
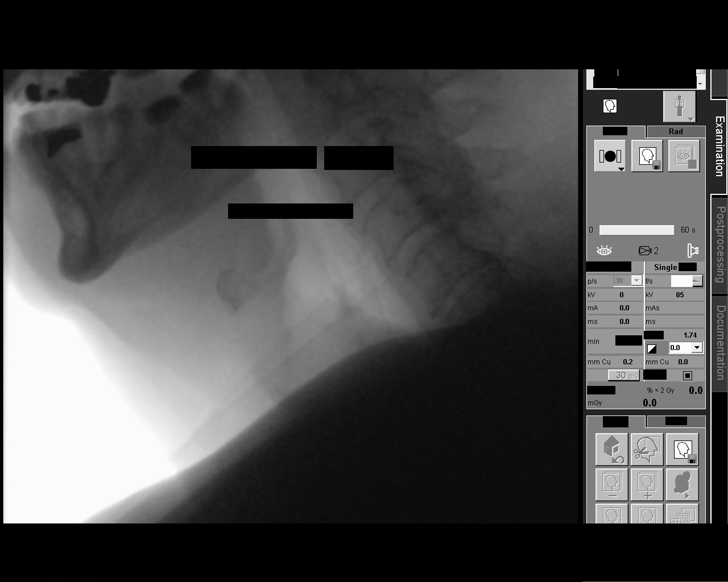

[Series 2: tsp thin penetration · 1 of 381 frames shown]
[frame 324/381]
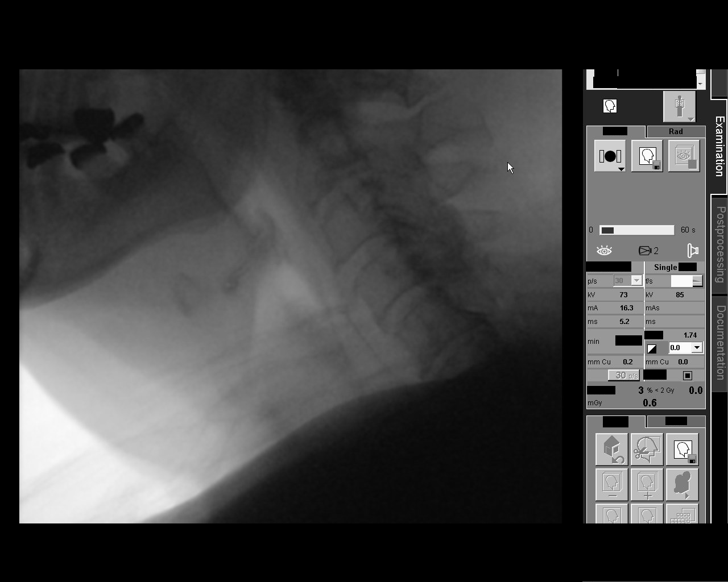

[Series 4: cup sip thin · 1 of 352 frames shown (1 of 2)]
[frame 177/352]
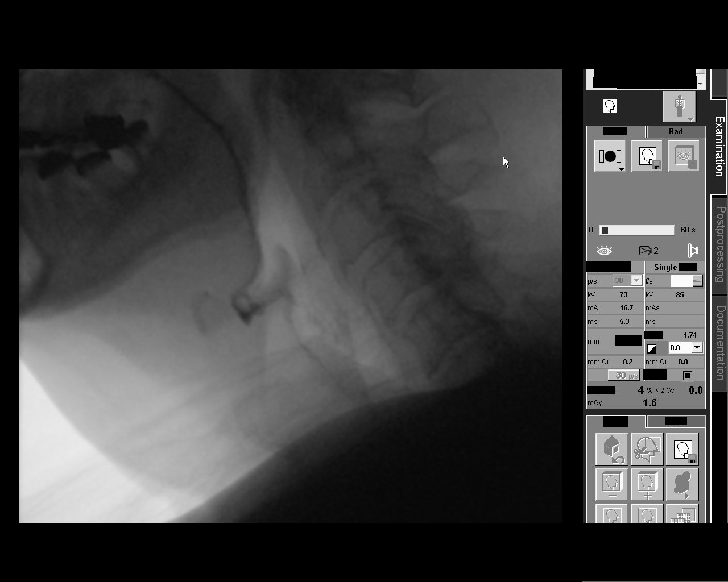

[Series 6: straw thin penetration · 1 of 705 frames shown]
[frame 458/705]
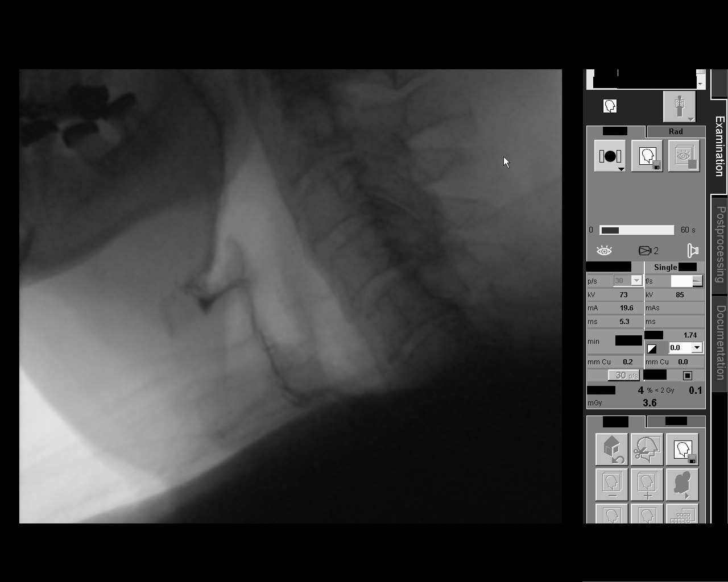

[Series 8: puree · 1 of 224 frames shown (1 of 3)]
[frame 113/224]
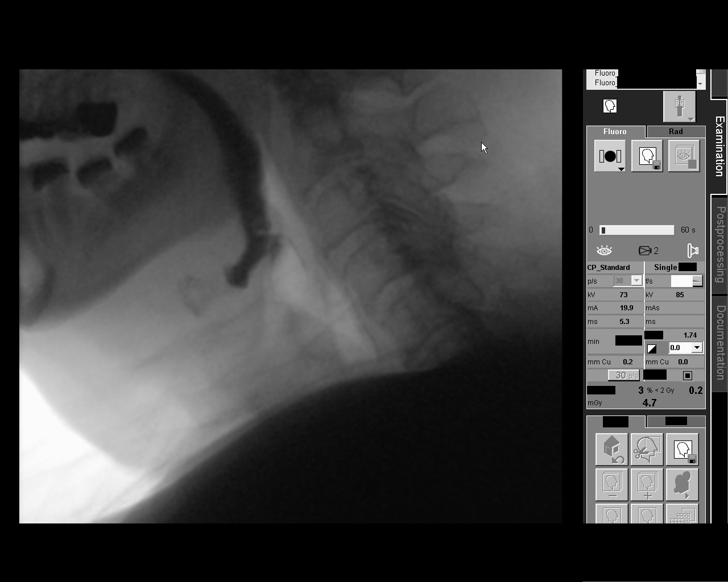

[Series 10: regular · 1 of 402 frames shown (1 of 3)]
[frame 61/402]
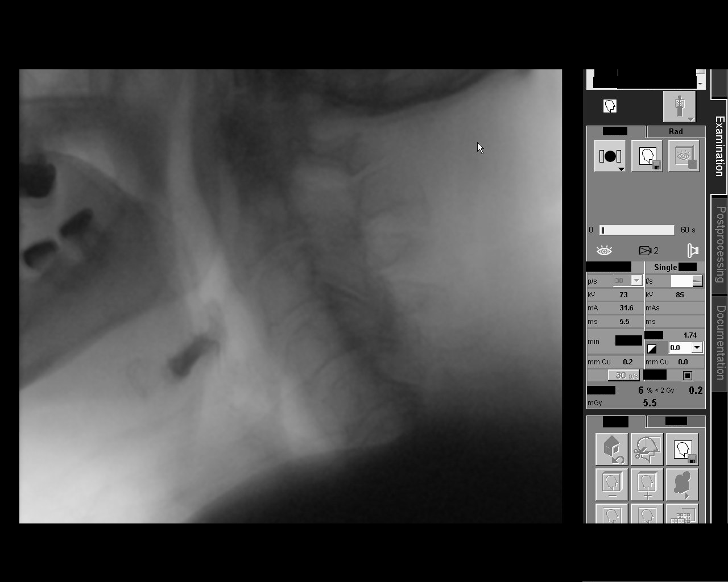

[Series 11: regular · 1 of 404 frames shown (2 of 3)]
[frame 344/404]
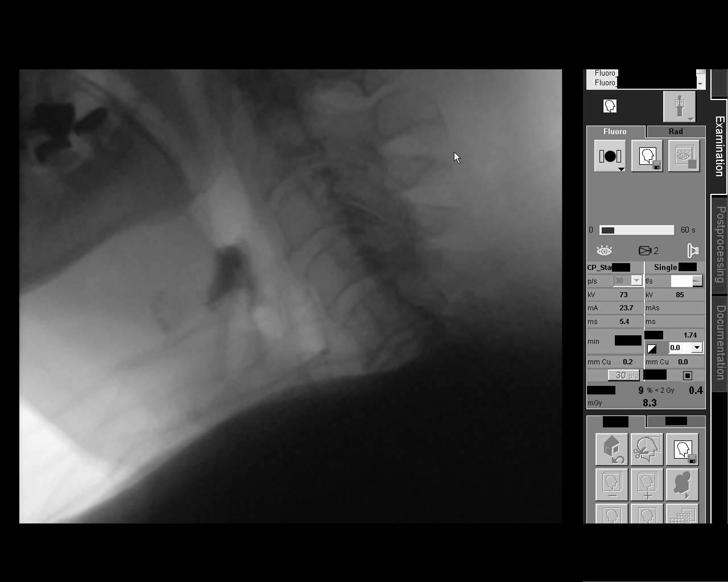

[Series 12: barium tab thin straw · 1 of 555 frames shown]
[frame 472/555]
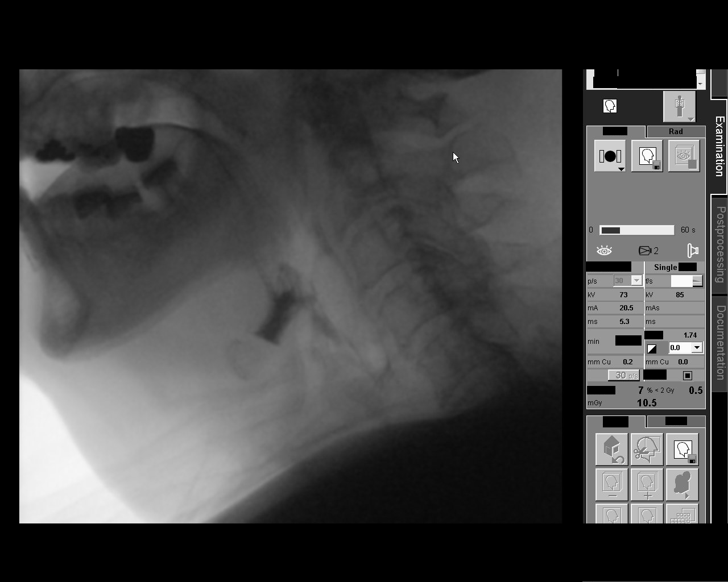

[Series 14: puree · 1 of 318 frames shown (2 of 3)]
[frame 172/318]
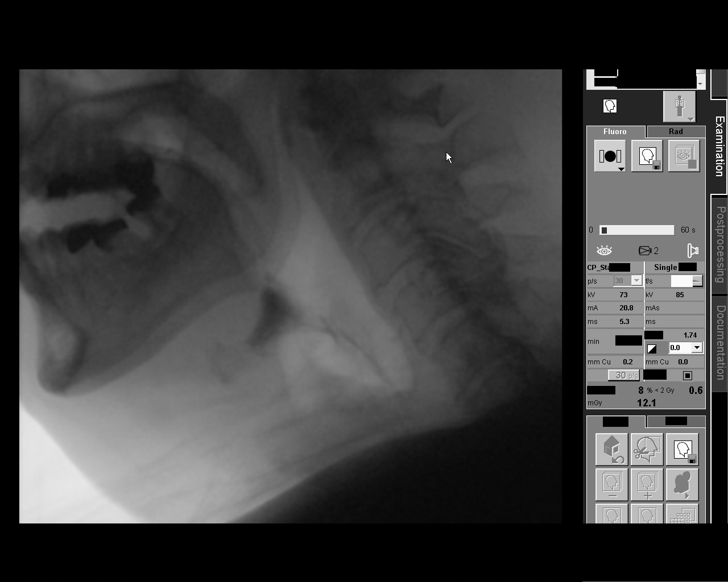

[Series 16: straw thin · 1 of 536 frames shown]
[frame 113/536]
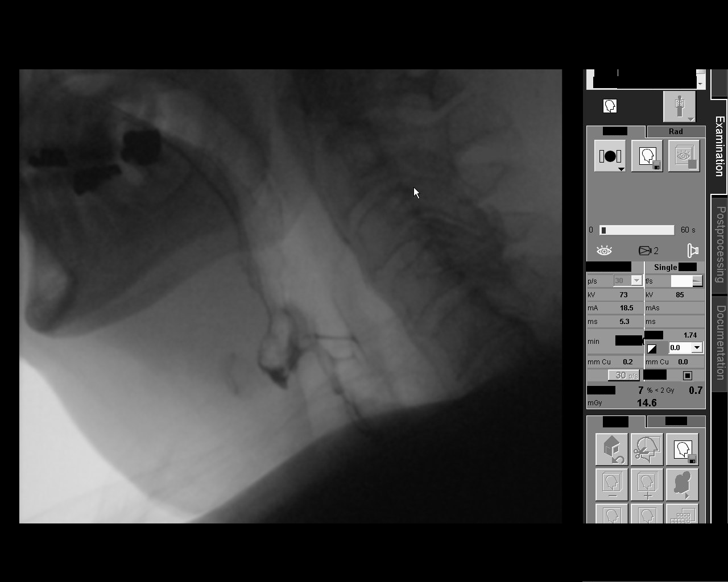

[Series 18: puree · 1 of 312 frames shown (3 of 3)]
[frame 50/312]
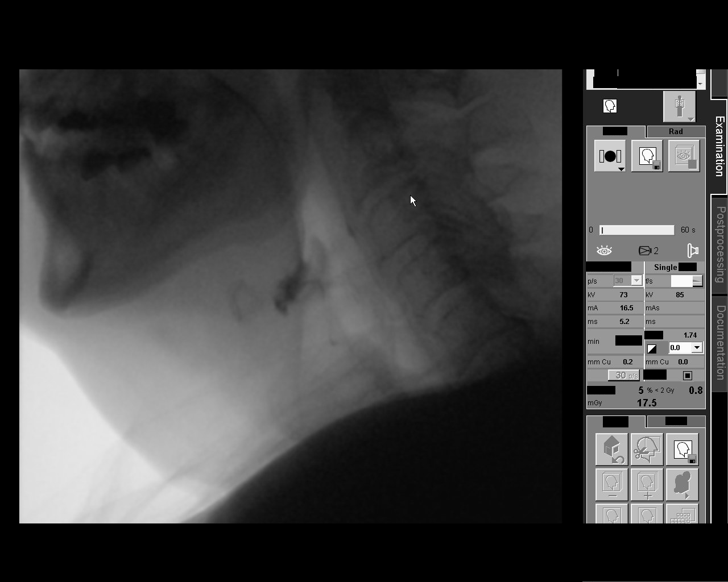

[Series 20: cup sip thin · 1 of 782 frames shown (2 of 2)]
[frame 118/782]
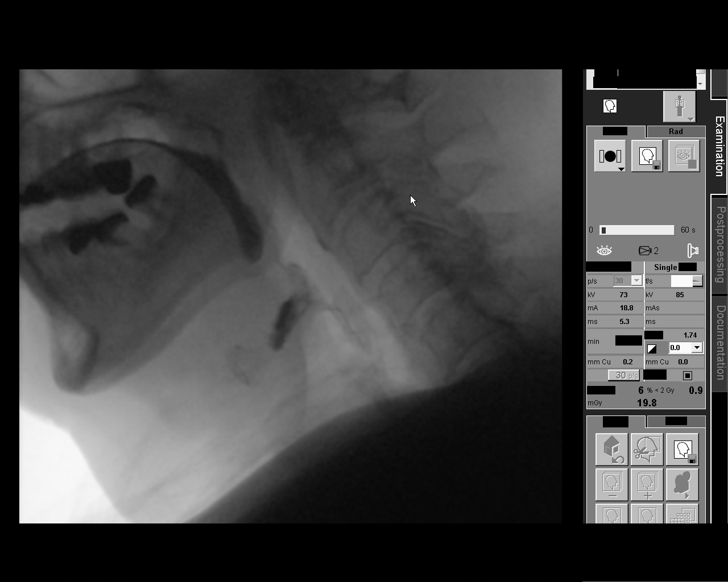

[Series 21: regular · 1 of 967 frames shown (3 of 3)]
[frame 822/967]
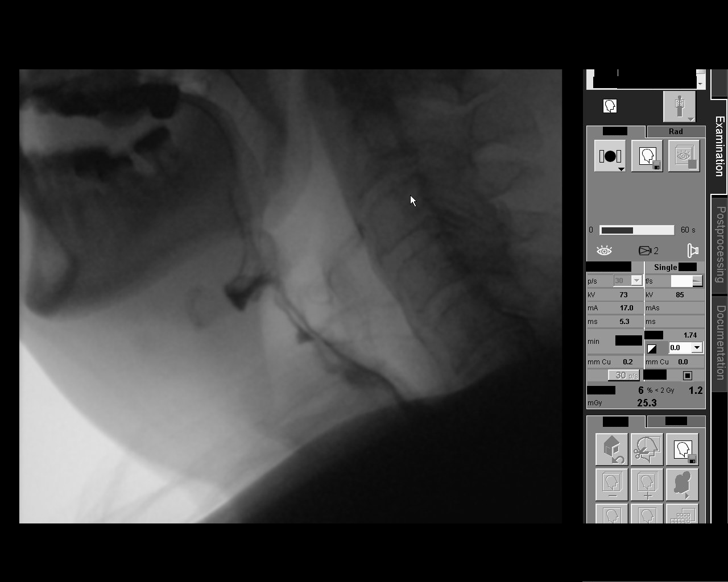

[13 of 24 positions shown; findings below may reference images not displayed]

FINDINGS: Imaging was performed and then repeated after a 10 minutes delay.
With thin barium by teaspoon and straw, flash laryngeal penetration
was identified. A single episode of aspiration below the vocal cords
was noted with thin barium by straw. Vallecular residuals were seen.
With applesauce consistency, no laryngeal penetration or aspiration
were identified though increased vallecular residuals were noted.
Similar findings were seen with cracker consistency, no laryngeal
penetration or aspiration but vallecular residuals noted.

Patient swallowed a 12.5 mm diameter barium tablet which transiently
lodged in the vallecula and was cleared with applesauce.
IMPRESSION: Swallowing dysfunction as above.

Please refer to the Speech Pathologists report for complete details
and recommendations.

## 2021-04-27 NOTE — Therapy (Signed)
[facility] [address] [place], [place], [address] Phone: [phone]   Fax:  [phone]  Modified Barium Swallow  Patient Details  Name: KAYDENCE BABA MRN: [id number] Date of Birth: [birth year] Referring Provider (SLP): Dr. Beryle Beams   Encounter Date: 04/27/2021   End of Session - 04/27/21 1448     Visit Number 1    Number of Visits 1    Activity Tolerance Patient tolerated treatment well              Past Medical History:  Past Medical History:  Diagnosis Date   A-fib (HCC)    chronic afib history per 02/20/16 [facility] cardioloy note   Arthritis    Coronary atherosclerosis of native coronary artery    DES ramus 2005, LVEF 55%.Takes Pradaxa daily   Enlarged prostate    takes Flomax nightly   Essential hypertension, benign    takes Atenolol,Lisinipril-HCTZ,and Procardia daily   GERD (gastroesophageal reflux disease)    takes Pantoprazole daily   Gout    takes Allopurinol daily   History of kidney stones    Hyperlipidemia    takes Simvastatin daily   Joint pain    Joint swelling    MI (myocardial infarction) (HCC)    [place] 2005   Mood disorder (HCC)    takes Fluoxetine daily   Nocturia    OSA (obstructive sleep apnea)    On CPAP   Peripheral edema    occasionally   Pneumonia    history of    Urinary frequency    Past Surgical History:  Past Surgical History:  Procedure Laterality Date   ANKLE ARTHROSCOPY     Left   BACK SURGERY     CARDIAC CATHETERIZATION  2005   CORONARY ANGIOPLASTY     1 stent   skin tags removed     TOTAL KNEE ARTHROPLASTY     Right   TOTAL KNEE ARTHROPLASTY Left 07/31/2016   Procedure: LEFT TOTAL KNEE ARTHROPLASTY;  Surgeon: Eldred Manges, MD;  Location: [facility];  Service: Orthopedics;  Laterality: Left;   HPI: Mr. Narramore is a 70 y/o male with hx of asthma, GERD, myocardial infarction who has recently been informally diagnosed with Myasthenia Gravis (MG).  His symptoms began approximately one month ago when he suddently began having double vision. He went to the [place] where he had a lengthy appointment and was provided glasses that correct double vision. [facility] that he [facility] a neurologist ASAP and he was then seen by Dr. Gerilyn Pilgrim who referred him to speech therapy for dysarthria. SLP requested MBSS to objectively assess the swallowing function secondary to reports of dysphagia and "choking" with further concerns secondary to reports of a 35 lb weight loss in the last month since MG symptoms began. Pt was started on medication last week (04/18/2021) that has reportedly significantly improved his dysphagia.   No data recorded   Assessment / Plan / Recommendation  CHL IP CLINICAL IMPRESSIONS 04/27/2021  Clinical Impression Pt presents with mild oropharyngeal dysphagia characterized by occasional flash penetration of thin liquids and one episode of sensed trace aspiration of thin liquids. This test was initially administered and floro was turned off for ten minutes with continued PO trials and talking to the patient in order to increase fatigue and oral motor load prior to floro being turned on for additional trials. Single episode of aspiration was visualized after this break from floro when Pt was also demonstrating increased  dysarthria (indicating fatigue and decreased oral motor strength). With solid textures presentation was similar before and after the break with mild valleculae and pyriform residue resulting from decreased pharyngeal sequeeze and decreased BOT retraction; repeat swallows were effective in facilitating clearance of pharyngeal residue. Recommed continue with regular textures and thin liquids with precautions: When using straws, take small sips and hold in oral cavity before swallowing hard and fast (no penetration visualized with this strategy), utilize slow rate, if feeling very fatigued - take a break and do not try to eat! Do not talk  and eat simultaneously, afer each swallow of liquids gently clear throat and produce an additional dry swallow. ST will continue to follow for dysarthria, strategies for dysphagia were provided today with extended education and support following assessment.  SLP Visit Diagnosis Dysphagia, oropharyngeal phase (R13.12)  Attention and concentration deficit following --  Frontal lobe and executive function deficit following --  Impact on safety and function Mild aspiration risk      No flowsheet data found.   No flowsheet data found.  CHL IP DIET RECOMMENDATION 04/27/2021  SLP Diet Recommendations Regular solids;Thin liquid  Liquid Administration via Cup;Straw  Medication Administration Whole meds with liquid  Compensations Minimize environmental distractions;Slow rate;Small sips/bites;Follow solids with liquid;Clear throat intermittently;Multiple dry swallows after each bite/sip  Postural Changes Remain semi-upright after after feeds/meals (Comment);Seated upright at 90 degrees      CHL IP OTHER RECOMMENDATIONS 04/27/2021  Recommended Consults --  Oral Care Recommendations Oral care BID  Other Recommendations --      No flowsheet data found.    No flowsheet data found.         CHL IP ORAL PHASE 04/27/2021  Oral Phase Impaired  Oral - Pudding Teaspoon --  Oral - Pudding Cup --  Oral - Honey Teaspoon --  Oral - Honey Cup --  Oral - Nectar Teaspoon --  Oral - Nectar Cup --  Oral - Nectar Straw --  Oral - Thin Teaspoon Decreased bolus cohesion;Reduced posterior propulsion  Oral - Thin Cup Decreased bolus cohesion;Reduced posterior propulsion  Oral - Thin Straw Decreased bolus cohesion;Reduced posterior propulsion  Oral - Puree Decreased bolus cohesion;Reduced posterior propulsion;Weak lingual manipulation  Oral - Mech Soft NT  Oral - Regular Decreased bolus cohesion;Reduced posterior propulsion;Weak lingual manipulation  Oral - Multi-Consistency NT  Oral - Pill WFL  Oral Phase -  Comment --    CHL IP PHARYNGEAL PHASE 04/27/2021  Pharyngeal Phase Impaired  Pharyngeal- Pudding Teaspoon --  Pharyngeal --  Pharyngeal- Pudding Cup --  Pharyngeal --  Pharyngeal- Honey Teaspoon --  Pharyngeal --  Pharyngeal- Honey Cup --  Pharyngeal --  Pharyngeal- Nectar Teaspoon --  Pharyngeal --  Pharyngeal- Nectar Cup --  Pharyngeal --  Pharyngeal- Nectar Straw --  Pharyngeal --  Pharyngeal- Thin Teaspoon Pharyngeal residue - valleculae;Pharyngeal residue - pyriform;Reduced tongue base retraction;Reduced airway/laryngeal closure;Penetration/Aspiration during swallow;Reduced pharyngeal peristalsis  Pharyngeal Material enters airway, remains ABOVE vocal cords then ejected out  Pharyngeal- Thin Cup Pharyngeal residue - valleculae;Pharyngeal residue - pyriform;Reduced tongue base retraction;Reduced airway/laryngeal closure;Penetration/Aspiration during swallow;Reduced pharyngeal peristalsis  Pharyngeal Material enters airway, remains ABOVE vocal cords then ejected out  Pharyngeal- Thin Straw Pharyngeal residue - valleculae;Pharyngeal residue - pyriform;Reduced tongue base retraction;Reduced airway/laryngeal closure;Penetration/Aspiration during swallow;Reduced pharyngeal peristalsis  Pharyngeal Material enters airway, passes BELOW cords then ejected out  Pharyngeal- Puree Pharyngeal residue - valleculae;Pharyngeal residue - pyriform;Reduced pharyngeal peristalsis;Reduced tongue base retraction  Pharyngeal --  Pharyngeal- Mechanical Soft NT  Pharyngeal --  Pharyngeal- Regular Pharyngeal residue - valleculae;Pharyngeal residue - pyriform;Reduced pharyngeal peristalsis;Reduced tongue base retraction  Pharyngeal --  Pharyngeal- Multi-consistency NT  Pharyngeal --  Pharyngeal- Pill Pharyngeal residue - pyriform;Reduced pharyngeal peristalsis;Reduced tongue base retraction  Pharyngeal --  Pharyngeal Comment --      Ailany Koren H. Romie Levee, CCC-SLP Speech Language  Pathologist    Georgetta Haber 04/27/2021, 2:49 PM  [facility] [address] [place], [place], [address] Phone: [phone]   Fax:  [phone]  Name: ISAIAHS CHANCY MRN: [id number] Date of Birth: Nov 30, 1950

## 2021-05-02 ENCOUNTER — Other Ambulatory Visit: Payer: Self-pay

## 2021-05-02 ENCOUNTER — Encounter (HOSPITAL_COMMUNITY): Payer: Self-pay | Admitting: Speech Pathology

## 2021-05-02 ENCOUNTER — Ambulatory Visit (HOSPITAL_COMMUNITY): Payer: No Typology Code available for payment source | Admitting: Speech Pathology

## 2021-05-02 DIAGNOSIS — R471 Dysarthria and anarthria: Secondary | ICD-10-CM | POA: Diagnosis not present

## 2021-05-02 NOTE — Therapy (Signed)
[facility] [place], [place], [address] Phone: [phone]   Fax:  [phone]  Speech Language Pathology Treatment  Patient Details  Name: Evan Warner MRN: [id number] Date of Birth: 1951/06/20 Referring Provider (SLP): Dr. Phillips Odor   Encounter Date: 05/02/2021   End of Session - 05/02/21 1610     Visit Number 3    Number of Visits 12    Authorization Type VA authorization completed and approved    SLP Start Time 1515    SLP Stop Time  0254    SLP Time Calculation (min) 43 min    Activity Tolerance Patient tolerated treatment well             Past Medical History:  Diagnosis Date   A-fib ([place])    chronic afib history per 02/20/16 [facility] cardi[facility] note   Arthritis    Coronary atherosclerosis of native coronary artery    DES ramus 2005, LVEF 55%.Takes Pradaxa daily   Enlarged prostate    takes Flomax nightly   Essential hypertension, benign    takes Atenolol,Lisinipril-HCTZ,and Procardia daily   GERD (gastroesophageal reflux disease)    takes Pantoprazole daily   Gout    takes Allopurinol daily   History of kidney stones    Hyperlipidemia    takes Simvastatin daily   Joint pain    Joint swelling    MI (myocardial infarction) ([place])    [place] 2005   Mood disorder ([place])    takes Fluoxetine daily   Nocturia    OSA (obstructive sleep apnea)    On CPAP   Peripheral edema    occasionally   Pneumonia    history of    Urinary frequency     Past Surgical History:  Procedure Laterality Date   ANKLE ARTHROSCOPY     Left   BACK SURGERY     CARDIAC CATHETERIZATION  2005   CORONARY ANGIOPLASTY     1 stent   skin tags removed     TOTAL KNEE ARTHROPLASTY     Right   TOTAL KNEE ARTHROPLASTY Left 07/31/2016   Procedure: LEFT TOTAL KNEE ARTHROPLASTY;  Surgeon: Marybelle Killings, MD;  Location: [facility];  Service: Orthopedics;  Laterality: Left;    There were no vitals filed for this  visit.          ADULT SLP TREATMENT - 05/02/21 1610       General Information   Behavior/Cognition Alert;Cooperative;Pleasant mood    Patient Positioning Upright in chair    HPI [name] is a 70 y/o male with hx of asthma, GERD, myocardial infarction who has recently been informally diagnosed with Myasthenia Gravis (MG). His symptoms began approximately one month ago when he suddently began having double vision. He went to the [place] where he had a lengthy appointment and was provided glasses that correct double vision. [facility] that he see a neurologist ASAP and he was then seen by Dr. Merlene Laughter who referred him to speech therapy for dysarthria. He has since been started on Mestinon with improvement noted.      Treatment Provided   Treatment provided Cognitive-Linquistic      Pain Assessment   Pain Assessment No/denies pain      Cognitive-Linquistic Treatment   Treatment focused on Dysarthria;Patient/family/caregiver education    Skilled Treatment education, compensatory strategies including slow rate, increased pauses, respiratory support and volume; energy conservation strategies      Assessment / Recommendations /  Plan   Plan Continue with current plan of care      Progression Toward Goals   Progression toward goals Progressing toward goals              SLP Education - 05/02/21 1610     Education Details Discussed and reviewed strategies and POC inluding energy conservation as they relate to speech clarity    Person(s) Educated Patient    Methods Explanation    Comprehension Verbalized understanding;Returned demonstration              SLP Short Term Goals - 05/02/21 1611       SLP SHORT TERM GOAL #1   Title Pt will utilize speech intelligibility strategies (over articulation, slowed rate, and more frequent pauses) at the sentence/conversation level with 90% acc and min cues.    Baseline met 6/15, 6/22    Time 8    Period Weeks    Status New       SLP SHORT TERM GOAL #2   Title Pt will increase speech intelligibility to [facility] for small group setting coversation and 1:1 phone calls with use of compensatory strategies as needed.    Baseline not tested upon eval    Time 8    Period Weeks    Status New      SLP SHORT TERM GOAL #3   Title Pt will participate in and report independent use of energy conversation strategies during the afternoons over 3 sessions    Baseline met 6/15, 6/22    Time 8    Period Weeks    Status New              SLP Long Term Goals - 05/02/21 1612       SLP LONG TERM GOAL #1   Title Pt will increase speech intelligibility to [facility] for daily conversation and communication even in the afternoons and evenings as symptoms worsen per Pt report and SLP diagnostic assessment in sessions    Baseline not tested    Time 8    Period Weeks    Status New              Plan - 05/02/21 1611     Clinical Impression Statement Today Pt was seen for ongoing dysarthria treatment, again note improvement from last week. Pt does demonstrate moments of slurred or decreased articulatory precision, however Pt's awareness is very good and he reports to SLP "see I'm having a hard time, give me a minute"; he demonstrated implementation of strategies today during these episodes with min verbal cues. SLP reinforced strategies and provided education that we will defer treatment at this time until after next appt with Neurologist- Pt will be seen again on July 20th here. At that time based on benefit from medication will determine the need to proceed or complete this treatment course. Pt was provided education and HEP assigned to facilitate carryover of treatment strategies and techniques in the home environment.    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 Roddie Mc, CCC-SLP Speech Language Pathologist  Wende Bushy 05/02/2021, 4:13 PM  [place] [address] [place], [place], [address] Phone: [phone]   Fax:  [phone]   Name: Evan Warner MRN: [id number] Date of Birth: [birth year]

## 2021-05-02 NOTE — Therapy (Signed)
[place], [place], [address] Phone: [phone]   Fax:  [phone]  Speech Language Pathology Treatment  Patient Details  Name: Evan Warner MRN: [id number] Date of Birth: 1951/08/27 Referring Provider (SLP): Dr. Phillips Odor   Encounter Date: 04/25/2021   End of Session - 04/25/21 1604     Visit Number 2    Number of Visits 12    Authorization Type VA authorization completed and approved    SLP Start Time 1517    SLP Stop Time  1600    SLP Time Calculation (min) 43 min    Activity Tolerance Patient tolerated treatment well             Past Medical History:  Diagnosis Date   A-fib ([place])    chronic afib history per 02/20/16 [facility] cardiol[facility] note   Arthritis    Coronary atherosclerosis of native coronary artery    DES ramus 2005, LVEF 55%.Takes Pradaxa daily   Enlarged prostate    takes Flomax nightly   Essential hypertension, benign    takes Atenolol,Lisinipril-HCTZ,and Procardia daily   GERD (gastroesophageal reflux disease)    takes Pantoprazole daily   Gout    takes Allopurinol daily   History of kidney stones    Hyperlipidemia    takes Simvastatin daily   Joint pain    Joint swelling    MI (myocardial infarction) ([place])    [place] 2005   Mood disorder ([place])    takes Fluoxetine daily   Nocturia    OSA (obstructive sleep apnea)    On CPAP   Peripheral edema    occasionally   Pneumonia    history of    Urinary frequency     Past Surgical History:  Procedure Laterality Date   ANKLE ARTHROSCOPY     Left   BACK SURGERY     CARDIAC CATHETERIZATION  2005   CORONARY ANGIOPLASTY     1 stent   skin tags removed     TOTAL KNEE ARTHROPLASTY     Right   TOTAL KNEE ARTHROPLASTY Left 07/31/2016   Procedure: LEFT TOTAL KNEE ARTHROPLASTY;  Surgeon: Marybelle Killings, MD;  Location: [facility];  Service: Orthopedics;  Laterality: Left;    There were no vitals filed for this  visit.   Subjective Assessment - 05/02/21 1603     Subjective Pt was pleasant and cooperative    Currently in Pain? No/denies                   ADULT SLP TREATMENT - 04/25/21 0001       General Information   Behavior/Cognition Alert;Cooperative;Pleasant mood    Patient Positioning Upright in chair    HPI Evan Warner is a 70 y/o male with hx of asthma, GERD, myocardial infarction who has recently been informally diagnosed with Myasthenia Gravis (MG). His symptoms began approximately one month ago when he suddently began having double vision. He went to the [place] where he had a lengthy appointment and was provided glasses that correct double vision. [facility] that he see a neurologist ASAP and he was then seen by Dr. Merlene Laughter who referred him to speech therapy for dysarthria. SLP requested MBSS to objectively assess the swallowing function secondary to reports of dysphagia and "choking" with further concerns secondary to reports of a 35 lb weight loss in the last month since MG symptoms began. Pt was started on medication last week (04/18/2021) that  has reportedly significantly improved his dysphagia.      Treatment Provided   Treatment provided Cognitive-Linquistic      Pain Assessment   Pain Assessment No/denies pain      Cognitive-Linquistic Treatment   Treatment focused on Dysarthria;Patient/family/caregiver education    Skilled Treatment education, compensatory strategies including slow rate, increased pauses, respiratory support and volume; energy conservation strategies      Assessment / Recommendations / Plan   Plan Continue with current plan of care      Progression Toward Goals   Progression toward goals Progressing toward goals              SLP Education - 04/25/21 1604     Education Details Discussed and reviewed strategies and POC inluding energy conservation as they relate to speech clarity    Person(s) Educated Patient    Methods Explanation     Comprehension Verbalized understanding;Returned demonstration;Verbal cues required              SLP Short Term Goals - 04/25/21 1606       SLP SHORT TERM GOAL #1   Title Pt will utilize speech intelligibility strategies (over articulation, slowed rate, and more frequent pauses) at the sentence/conversation level with 90% acc and min cues.    Baseline met 6/15    Time 8    Period Weeks    Status New      SLP SHORT TERM GOAL #2   Title Pt will increase speech intelligibility to [facility] for small group setting coversation and 1:1 phone calls with use of compensatory strategies as needed.    Baseline not tested upon eval    Time 8    Period Weeks    Status New      SLP SHORT TERM GOAL #3   Title Pt will participate in and report independent use of energy conversation strategies during the afternoons over 3 sessions    Baseline met 6/15    Time 8    Period Weeks    Status New              SLP Long Term Goals - 04/25/21 1607       SLP LONG TERM GOAL #1   Title Pt will increase speech intelligibility to [facility] for daily conversation and communication even in the afternoons and evenings as symptoms worsen per Pt report and SLP diagnostic assessment in sessions    Baseline not tested    Time 8    Period Weeks    Status New              Plan - 04/25/21 1605     Clinical Impression Statement Pt was provided dysarthria treatment including education, support and recitation of lengthy passages with implementation of strategies. Pt has been starrted on medication for MG with notable improvement in symptoms. Pt presents today with less fluctuating intelligibility; as conversation progresses Pt's speech is still characterized with decreased articulatory precision and is slurred, however, it is 90-100% intelligible even in these moments. When Pt recited a song + the pledge of allegence there were moments that were very rushed, in these moments Pt's speech was less intelligible. After  Pt completed the recitation SLP provided feedback and model including, pauses in speech, increased breaths and slowed rate. Pt completed the exercise again with significant improvement and utilization of strategies.    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 Roddie Mc, CCC-SLP Speech Language Pathologist  Wende Bushy 05/02/2021, 4:08 PM  [facility] [address] [place], [place], [address] Phone: [phone]   Fax:  [phone]   Name: Evan Warner MRN: [id number] Date of Birth: 23-Sep-1951

## 2021-05-30 ENCOUNTER — Other Ambulatory Visit: Payer: Self-pay

## 2021-05-30 ENCOUNTER — Ambulatory Visit (HOSPITAL_COMMUNITY): Payer: No Typology Code available for payment source | Attending: Neurology | Admitting: Speech Pathology

## 2021-05-30 DIAGNOSIS — R471 Dysarthria and anarthria: Secondary | ICD-10-CM | POA: Insufficient documentation

## 2021-05-30 NOTE — Therapy (Signed)
Evan Warner, [place], [address] Phone: [phone]   Fax:  [phone]  Speech Language Pathology Treatment & Discharge Summary  Patient Details  Name: Evan Warner MRN: [id number] Date of Birth: 09/05/1951 Referring Provider (SLP): Evan Warner   Encounter Date: 05/30/2021   End of Session - 05/30/21 1536     Visit Number 4    Number of Visits 12    Authorization Type [facility] authorization completed and approved    SLP Start Time 1519    SLP Stop Time  1549    SLP Time Calculation (min) 30 min    Activity Tolerance Patient tolerated treatment well             Past Medical History:  Diagnosis Date   A-fib ([place])    chronic afib history per 02/20/16 [facility] note   Arthritis    Coronary atherosclerosis of native coronary artery    DES ramus 2005, LVEF 55%.Takes Pradaxa daily   Enlarged prostate    takes Flomax nightly   Essential hypertension, benign    takes Atenolol,Lisinipril-HCTZ,and Procardia daily   GERD (gastroesophageal reflux disease)    takes Pantoprazole daily   Gout    takes Allopurinol daily   History of kidney stones    Hyperlipidemia    takes Simvastatin daily   Joint pain    Joint swelling    MI (myocardial infarction) ([place])    [place] 2005   Mood disorder ([place])    takes Fluoxetine daily   Nocturia    OSA (obstructive sleep apnea)    On CPAP   Peripheral edema    occasionally   Pneumonia    history of    Urinary frequency     Past Surgical History:  Procedure Laterality Date   ANKLE ARTHROSCOPY     Left   BACK SURGERY     CARDIAC CATHETERIZATION  2005   CORONARY ANGIOPLASTY     1 stent   skin tags removed     TOTAL KNEE ARTHROPLASTY     Right   TOTAL KNEE ARTHROPLASTY Left 07/31/2016   Procedure: LEFT TOTAL KNEE ARTHROPLASTY;  Surgeon: Marybelle Killings, MD;  Location: [facility];  Service: Orthopedics;  Laterality: Left;    There were no vitals  filed for this visit.          ADULT SLP TREATMENT - 05/30/21 0001       General Information   Behavior/Cognition Alert;Cooperative;Pleasant mood    Patient Positioning Upright in chair    HPI Evan Warner is a 70 y/o male with hx of asthma, GERD, myocardial infarction who has recently been informally diagnosed with Myasthenia Gravis (MG). His symptoms began approximately one month ago when he suddently began having double vision. He went to the [place] where he had a lengthy appointment and was provided glasses that correct double vision. [facility] requested that he see a neurologist ASAP and he was then seen by Evan Warner who referred him to speech therapy for dysarthria. SLP requested MBSS to objectively assess the swallowing function secondary to reports of dysphagia and "choking" with further concerns secondary to reports of a 35 lb weight loss in the last month since MG symptoms began. Pt was started on medication last week (04/18/2021) that has reportedly significantly improved his dysphagia.      Treatment Provided   Treatment provided Cognitive-Linquistic      Pain Assessment   Pain Assessment No/denies  pain      Cognitive-Linquistic Treatment   Treatment focused on Dysarthria;Patient/family/caregiver education    Skilled Treatment education, compensatory strategies including slow rate, increased pauses, respiratory support and volume; energy conservation strategies      Assessment / Recommendations / Plan   Plan Continue with current plan of care      Progression Toward Goals   Progression toward goals Progressing toward goals              SLP Education - 05/30/21 1533     Education Details Reviewed strategies for energy conservation, reviewed strategies for dysarthria    Person(s) Educated Patient    Methods Explanation    Comprehension Verbalized understanding              SLP Short Term Goals - 05/30/21 1552       SLP SHORT TERM GOAL #1   Title Pt will  utilize speech intelligibility strategies (over articulation, slowed rate, and more frequent pauses) at the sentence/conversation level with 90% acc and min cues.    Baseline met 6/15, 6/22, 7/22    Time 8    Period Weeks    Status Achieved      SLP SHORT TERM GOAL #2   Title Pt will increase speech intelligibility to [facility] for small group setting coversation and 1:1 phone calls with use of compensatory strategies as needed.    Baseline based on Pt report this is challenging for the Pt, however he knows how to implement strategies    Time 8    Period Weeks    Status Achieved      SLP SHORT TERM GOAL #3   Title Pt will participate in and report independent use of energy conversation strategies during the afternoons over 3 sessions    Baseline met 6/15, 6/22    Time 8    Period Weeks    Status Achieved              SLP Long Term Goals - 05/30/21 1554       SLP LONG TERM GOAL #1   Title Pt will increase speech intelligibility to [facility] for daily conversation and communication even in the afternoons and evenings as symptoms worsen per Pt report and SLP diagnostic assessment in sessions    Baseline not tested    Time 8    Period Weeks    Status Achieved              Plan - 05/30/21 1538     Clinical Impression Statement Today Pt was seen for ongoing dysarthria treatment. Pt presents with speech negatively impacted today by poor management of secretions resulting in slurry speech that got progressively less clear as the session went on. Further note inconsistently decreased articulatory precision. Pt's awareness continues to be good and he reports to SLP "see I'm having a hard time, give me a minute"; he demonstrated implementation of strategies today during these episodes with min verbal cues. SLP reinforced strategies and provided education. Pt reports that he was started on a steroid at his most recent neorology appointment and is anticipating seeing results in the next 2 weeks  from initiating this. Pt has met goals and understands that strategies should be implemented when he is experiencing a flare up for both dysarthria and dysphagia. There are no further ST treatment needs at this time and will be discharged. Pt was provided education and HEP assigned to facilitate carryover of treatment strategies and techniques in the home  environment.    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 Roddie Mc, CCC-SLP Speech Language Pathologist  Wende Bushy 05/30/2021, 4:01 PM  [facility] [address] [place], [place], [address] Phone: [phone]   Fax:  [phone]   Name: Evan Warner MRN: [id number] Date of Birth: 08/18/1951

## 2021-08-31 ENCOUNTER — Emergency Department (HOSPITAL_COMMUNITY): Payer: No Typology Code available for payment source

## 2021-08-31 ENCOUNTER — Other Ambulatory Visit: Payer: Self-pay

## 2021-08-31 ENCOUNTER — Encounter (HOSPITAL_COMMUNITY): Payer: Self-pay | Admitting: Emergency Medicine

## 2021-08-31 ENCOUNTER — Inpatient Hospital Stay (HOSPITAL_COMMUNITY)
Admission: EM | Admit: 2021-08-31 | Discharge: 2021-09-03 | DRG: 872 | Disposition: A | Discharge status: Home / Self Care | Payer: No Typology Code available for payment source | Attending: Internal Medicine | Admitting: Internal Medicine

## 2021-08-31 DIAGNOSIS — L02415 Cutaneous abscess of right lower limb: Secondary | ICD-10-CM | POA: Diagnosis not present

## 2021-08-31 DIAGNOSIS — M1990 Unspecified osteoarthritis, unspecified site: Secondary | ICD-10-CM | POA: Diagnosis present

## 2021-08-31 DIAGNOSIS — G4733 Obstructive sleep apnea (adult) (pediatric): Secondary | ICD-10-CM | POA: Diagnosis present

## 2021-08-31 DIAGNOSIS — M109 Gout, unspecified: Secondary | ICD-10-CM | POA: Diagnosis present

## 2021-08-31 DIAGNOSIS — R351 Nocturia: Secondary | ICD-10-CM | POA: Diagnosis present

## 2021-08-31 DIAGNOSIS — K219 Gastro-esophageal reflux disease without esophagitis: Secondary | ICD-10-CM | POA: Diagnosis present

## 2021-08-31 DIAGNOSIS — Z7901 Long term (current) use of anticoagulants: Secondary | ICD-10-CM | POA: Diagnosis not present

## 2021-08-31 DIAGNOSIS — E876 Hypokalemia: Secondary | ICD-10-CM | POA: Diagnosis present

## 2021-08-31 DIAGNOSIS — F419 Anxiety disorder, unspecified: Secondary | ICD-10-CM | POA: Diagnosis present

## 2021-08-31 DIAGNOSIS — Z9861 Coronary angioplasty status: Secondary | ICD-10-CM | POA: Diagnosis not present

## 2021-08-31 DIAGNOSIS — I4891 Unspecified atrial fibrillation: Secondary | ICD-10-CM

## 2021-08-31 DIAGNOSIS — N401 Enlarged prostate with lower urinary tract symptoms: Secondary | ICD-10-CM | POA: Diagnosis present

## 2021-08-31 DIAGNOSIS — R001 Bradycardia, unspecified: Secondary | ICD-10-CM | POA: Diagnosis not present

## 2021-08-31 DIAGNOSIS — M79606 Pain in leg, unspecified: Secondary | ICD-10-CM

## 2021-08-31 DIAGNOSIS — Z6841 Body Mass Index (BMI) 40.0 and over, adult: Secondary | ICD-10-CM | POA: Diagnosis not present

## 2021-08-31 DIAGNOSIS — I252 Old myocardial infarction: Secondary | ICD-10-CM

## 2021-08-31 DIAGNOSIS — G7000 Myasthenia gravis without (acute) exacerbation: Secondary | ICD-10-CM | POA: Diagnosis present

## 2021-08-31 DIAGNOSIS — Z23 Encounter for immunization: Secondary | ICD-10-CM | POA: Diagnosis not present

## 2021-08-31 DIAGNOSIS — A419 Sepsis, unspecified organism: Secondary | ICD-10-CM | POA: Diagnosis present

## 2021-08-31 DIAGNOSIS — E785 Hyperlipidemia, unspecified: Secondary | ICD-10-CM | POA: Diagnosis present

## 2021-08-31 DIAGNOSIS — R350 Frequency of micturition: Secondary | ICD-10-CM | POA: Diagnosis present

## 2021-08-31 DIAGNOSIS — W25XXXA Contact with sharp glass, initial encounter: Secondary | ICD-10-CM | POA: Diagnosis present

## 2021-08-31 DIAGNOSIS — I10 Essential (primary) hypertension: Secondary | ICD-10-CM | POA: Diagnosis present

## 2021-08-31 DIAGNOSIS — I2510 Atherosclerotic heart disease of native coronary artery without angina pectoris: Secondary | ICD-10-CM | POA: Diagnosis present

## 2021-08-31 DIAGNOSIS — R0609 Other forms of dyspnea: Secondary | ICD-10-CM | POA: Diagnosis not present

## 2021-08-31 DIAGNOSIS — Z87891 Personal history of nicotine dependence: Secondary | ICD-10-CM | POA: Diagnosis not present

## 2021-08-31 DIAGNOSIS — Z20822 Contact with and (suspected) exposure to covid-19: Secondary | ICD-10-CM | POA: Diagnosis present

## 2021-08-31 DIAGNOSIS — Z79899 Other long term (current) drug therapy: Secondary | ICD-10-CM

## 2021-08-31 DIAGNOSIS — F32A Depression, unspecified: Secondary | ICD-10-CM | POA: Diagnosis present

## 2021-08-31 DIAGNOSIS — R7302 Impaired glucose tolerance (oral): Secondary | ICD-10-CM | POA: Diagnosis present

## 2021-08-31 DIAGNOSIS — Z7982 Long term (current) use of aspirin: Secondary | ICD-10-CM

## 2021-08-31 DIAGNOSIS — Z96653 Presence of artificial knee joint, bilateral: Secondary | ICD-10-CM | POA: Diagnosis present

## 2021-08-31 DIAGNOSIS — I482 Chronic atrial fibrillation, unspecified: Secondary | ICD-10-CM | POA: Diagnosis present

## 2021-08-31 DIAGNOSIS — R600 Localized edema: Secondary | ICD-10-CM

## 2021-08-31 DIAGNOSIS — L03115 Cellulitis of right lower limb: Secondary | ICD-10-CM

## 2021-08-31 DIAGNOSIS — Z7952 Long term (current) use of systemic steroids: Secondary | ICD-10-CM

## 2021-08-31 HISTORY — DX: Myasthenia gravis without (acute) exacerbation: G70.00

## 2021-08-31 LAB — LACTIC ACID, PLASMA
Lactic Acid, Venous: 1.5 mmol/L (ref 0.5–1.9)
Lactic Acid, Venous: 1.5 mmol/L (ref 0.5–1.9)

## 2021-08-31 LAB — SEDIMENTATION RATE: Sed Rate: 14 mm/hr (ref 0–16)

## 2021-08-31 LAB — PROTIME-INR
INR: 1.3 — ABNORMAL HIGH (ref 0.8–1.2)
Prothrombin Time: 16.6 seconds — ABNORMAL HIGH (ref 11.4–15.2)

## 2021-08-31 LAB — CBC WITH DIFFERENTIAL/PLATELET
Abs Immature Granulocytes: 0.08 10*3/uL — ABNORMAL HIGH (ref 0.00–0.07)
Basophils Absolute: 0.1 10*3/uL (ref 0.0–0.1)
Basophils Relative: 0 %
Eosinophils Absolute: 0.1 10*3/uL (ref 0.0–0.5)
Eosinophils Relative: 1 %
HCT: 43.2 % (ref 39.0–52.0)
Hemoglobin: 14.2 g/dL (ref 13.0–17.0)
Immature Granulocytes: 1 %
Lymphocytes Relative: 10 %
Lymphs Abs: 1.5 10*3/uL (ref 0.7–4.0)
MCH: 32.1 pg (ref 26.0–34.0)
MCHC: 32.9 g/dL (ref 30.0–36.0)
MCV: 97.5 fL (ref 80.0–100.0)
Monocytes Absolute: 0.8 10*3/uL (ref 0.1–1.0)
Monocytes Relative: 5 %
Neutro Abs: 13.1 10*3/uL — ABNORMAL HIGH (ref 1.7–7.7)
Neutrophils Relative %: 83 %
Platelets: 175 10*3/uL (ref 150–400)
RBC: 4.43 MIL/uL (ref 4.22–5.81)
RDW: 14.6 % (ref 11.5–15.5)
WBC: 15.7 10*3/uL — ABNORMAL HIGH (ref 4.0–10.5)
nRBC: 0 % (ref 0.0–0.2)

## 2021-08-31 LAB — COMPREHENSIVE METABOLIC PANEL
ALT: 21 U/L (ref 0–44)
AST: 17 U/L (ref 15–41)
Albumin: 3.8 g/dL (ref 3.5–5.0)
Alkaline Phosphatase: 57 U/L (ref 38–126)
Anion gap: 8 (ref 5–15)
BUN: 20 mg/dL (ref 8–23)
CO2: 30 mmol/L (ref 22–32)
Calcium: 8.8 mg/dL — ABNORMAL LOW (ref 8.9–10.3)
Chloride: 102 mmol/L (ref 98–111)
Creatinine, Ser: 1.13 mg/dL (ref 0.61–1.24)
GFR, Estimated: >60 mL/min (ref >60)
Glucose, Bld: 114 mg/dL — ABNORMAL HIGH (ref 70–99)
Potassium: 2.9 mmol/L — ABNORMAL LOW (ref 3.5–5.1)
Sodium: 140 mmol/L (ref 135–145)
Total Bilirubin: 1.6 mg/dL — ABNORMAL HIGH (ref 0.3–1.2)
Total Protein: 6.7 g/dL (ref 6.5–8.1)

## 2021-08-31 LAB — RESP PANEL BY RT-PCR (FLU A&B, COVID) ARPGX2
Influenza A by PCR: NEGATIVE
Influenza B by PCR: NEGATIVE
SARS Coronavirus 2 by RT PCR: NEGATIVE

## 2021-08-31 LAB — C-REACTIVE PROTEIN: CRP: 7.3 mg/dL — ABNORMAL HIGH (ref <1.0)

## 2021-08-31 LAB — PROCALCITONIN: Procalcitonin: 0.12 ng/mL

## 2021-08-31 LAB — URIC ACID: Uric Acid, Serum: 5.4 mg/dL (ref 3.7–8.6)

## 2021-08-31 IMAGING — MR MR ANKLE*R* W/O CM
5 series · 36 of 40 positions shown · non-contrast
Comparison: Radiographs [DATE]

CLINICAL DATA: Redness and inflammation in the right lower leg.
Soft tissue infection suspected. History of diabetes.

EXAM:
MRI OF THE RIGHT ANKLE WITHOUT CONTRAST
TECHNIQUE: Multiplanar, multisequence MR imaging of the ankle was performed. No
intravenous contrast was administered.

[Series 5: T2 fat-sat · axial · right · 3.0mm · 0.50mm/px · z∈[-231,-87]mm · 9 of 38 slices shown (1 of 2)]
[im 1/38]
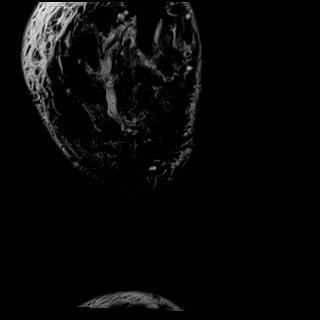
[im 5/38]
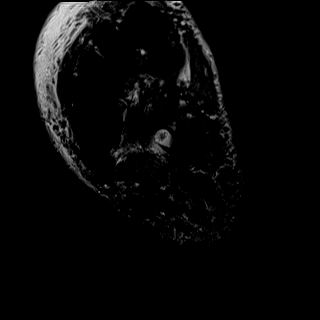
[im 10/38]
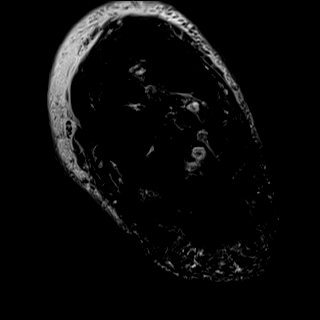
[im 14/38]
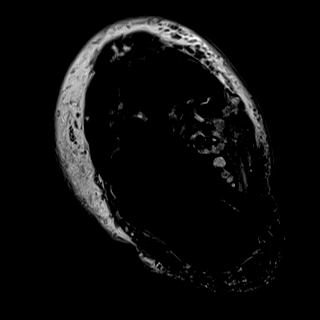
[im 19/38]
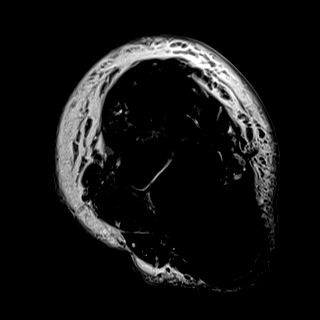
[im 24/38]
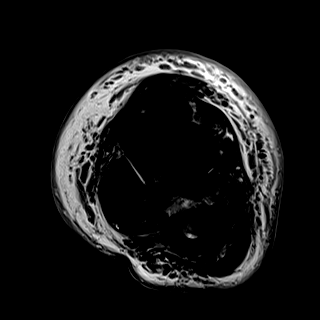
[im 28/38]
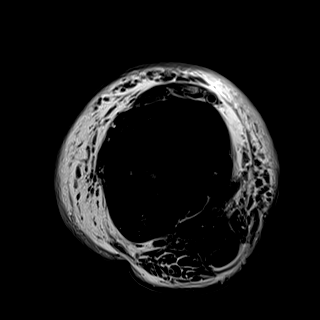
[im 33/38]
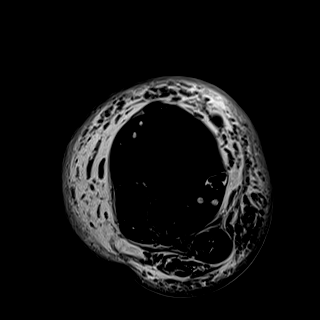
[im 38/38]
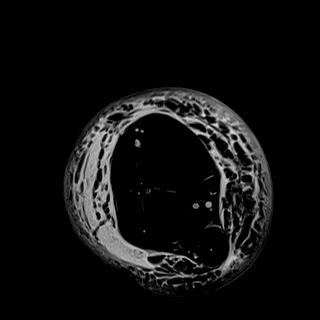

[Series 6: PD fat-sat · axial · right · 3.0mm · 0.50mm/px · z∈[-231,-87]mm · 9 of 38 slices shown]
[im 1/38]
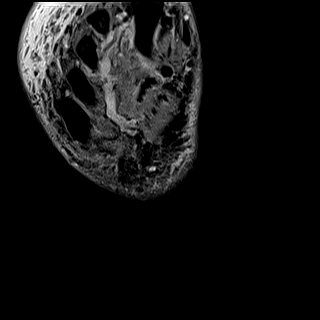
[im 5/38]
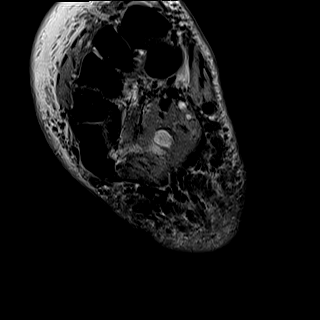
[im 10/38]
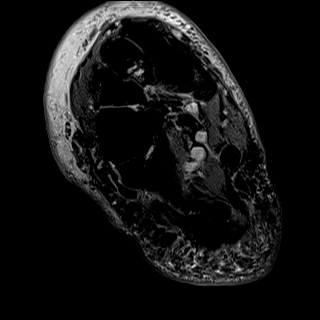
[im 14/38]
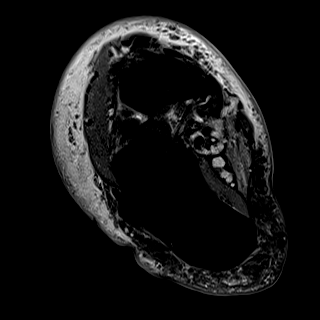
[im 19/38]
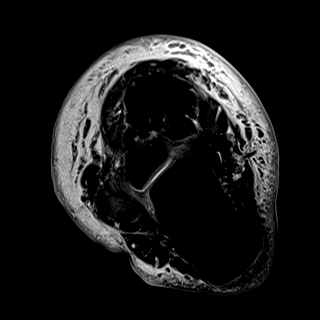
[im 24/38]
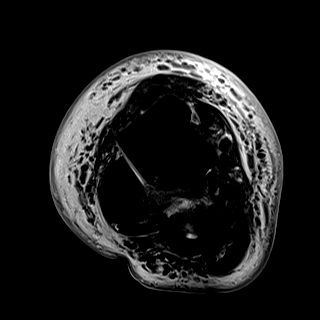
[im 28/38]
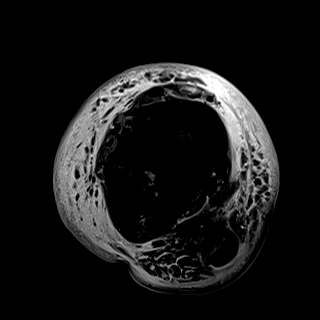
[im 33/38]
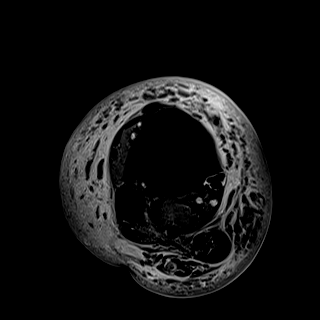
[im 38/38]
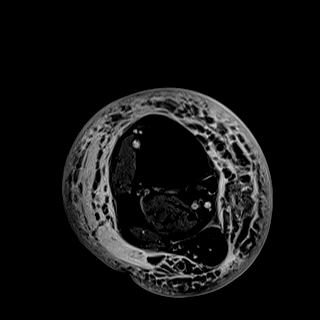

[Series 7: T1 · sagittal · right · 4.0mm · 0.59mm/px · 6 of 25 slices shown]
[im 1/25]
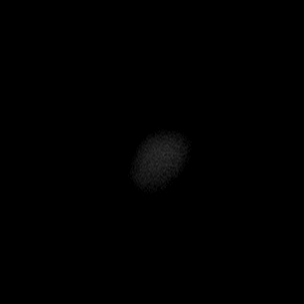
[im 5/25]
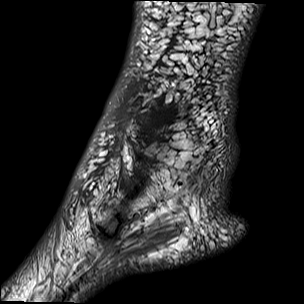
[im 10/25]
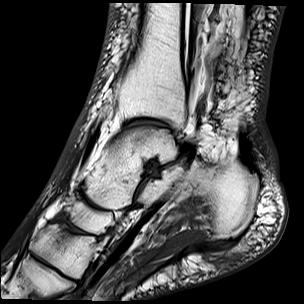
[im 15/25]
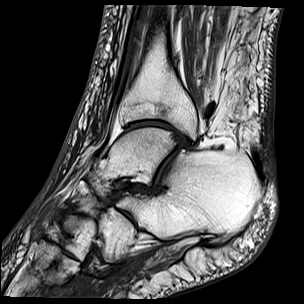
[im 20/25]
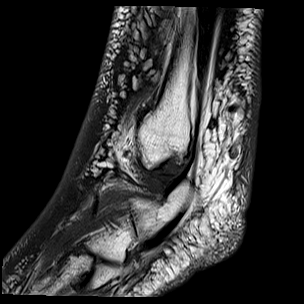
[im 25/25]
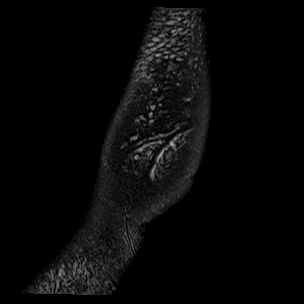

[Series 8: STIR · sagittal · right · 4.0mm · 0.30mm/px · 4 of 25 slices shown]
[im 1/25]
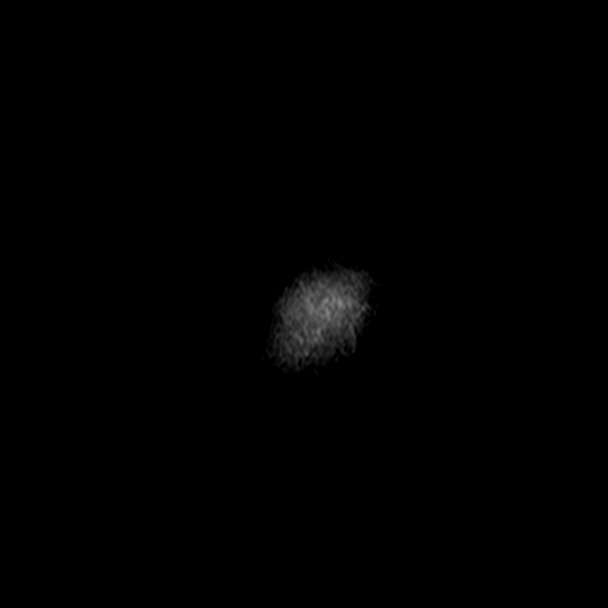
[im 5/25]
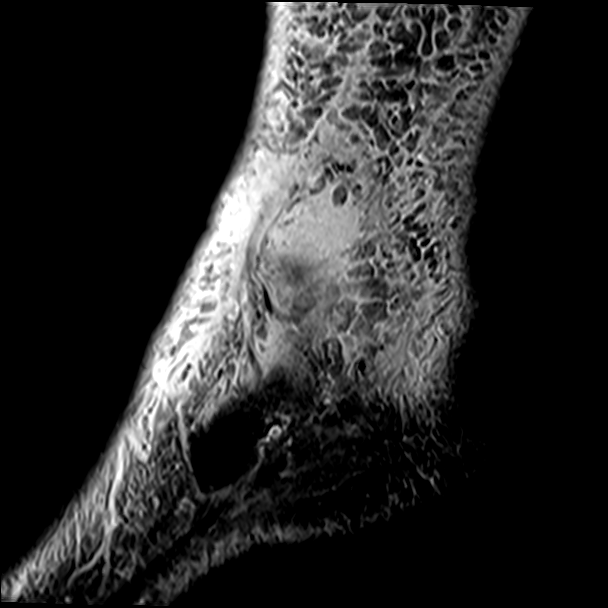
[im 10/25]
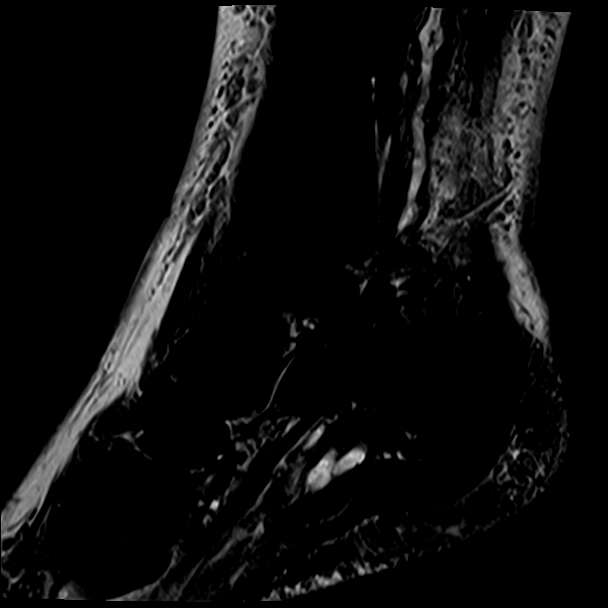
[im 15/25]
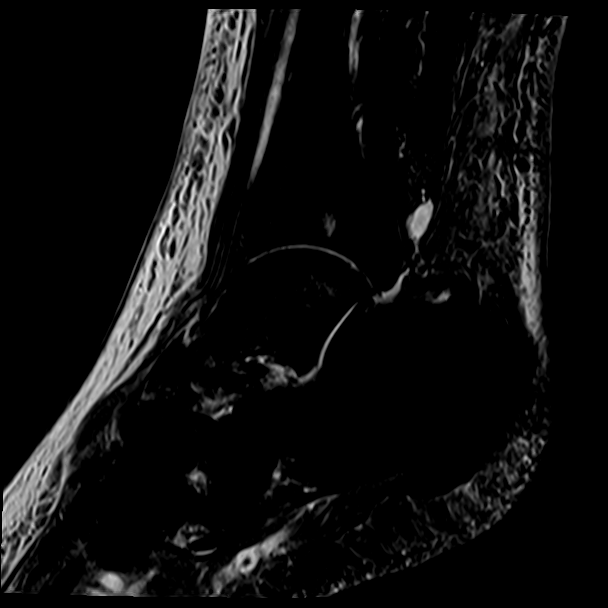

[Series 9: T2 fat-sat · coronal · right · 3.0mm · 0.56mm/px · 8 of 40 slices shown (2 of 2)]
[im 1/40]
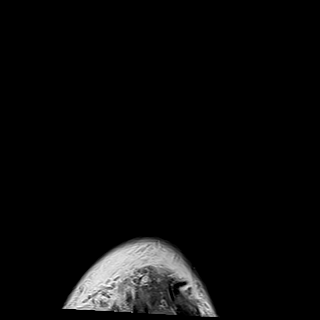
[im 5/40]
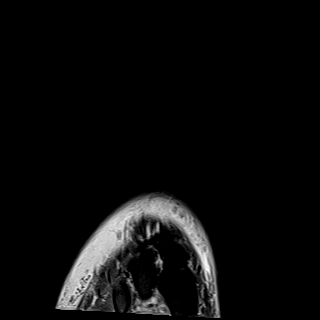
[im 14/40]
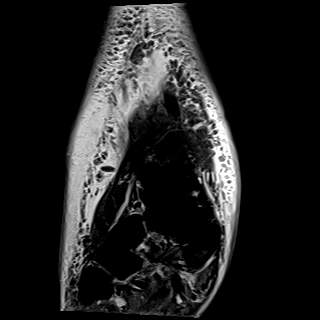
[im 18/40]
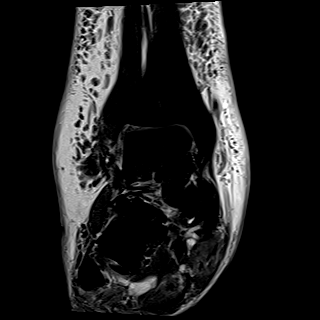
[im 22/40]
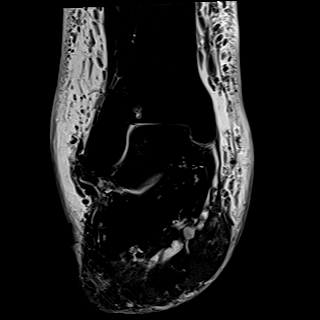
[im 27/40]
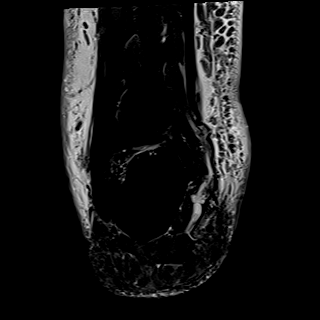
[im 35/40]
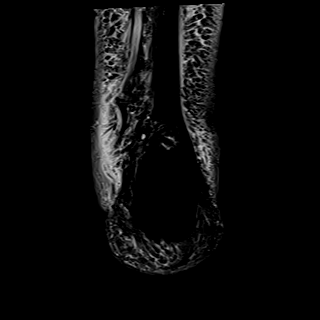
[im 40/40]
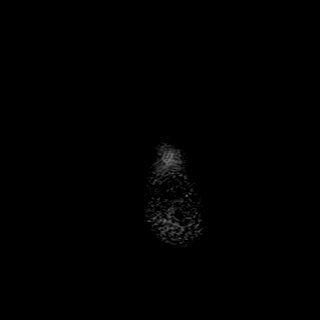

[36 of 40 positions shown; findings below may reference images not displayed]

FINDINGS: TENDONS

Peroneal: Intact and normally positioned.

Posteromedial: Mild posterior tibialis tendinosis with probable mild
longitudinal split tearing. The additional medial flexor tendons
appear intact. No significant tendon sheath fluid.

Anterior: Intact and normally positioned.

Achilles: Intact.

Plantar Fascia: Mild thickening of the central cord of the plantar
fascia without tear. Small adjacent plantar calcaneal spur.

LIGAMENTS

Lateral: The anterior and posterior talofibular and calcaneofibular
ligaments are intact.The inferior tibiofibular ligaments appear
intact.

Medial: The deltoid and visualized portions of the spring ligament
appear intact.

CARTILAGE AND BONES

Ankle Joint: No significant ankle joint effusion. There are mild
tibiotalar degenerative changes with prominent subchondral cysts
laterally in the tibial plafond.

Subtalar Joints/Sinus Tarsi: Mild subtalar degenerative changes.
There is mild edema in the tarsal sinus. No focal fluid collection.

Bones: No evidence of acute fracture, dislocation or bone
destruction.

Other: Generalized subcutaneous edema surrounding the ankle without
focal fluid collection.
IMPRESSION: 1. Generalized subcutaneous edema surrounding the ankle, as can be
seen with cellulitis, venous insufficiency, heart failure or
lymphedema.
2. No evidence of osteomyelitis, septic joint or abscess.
3. Mild tibiotalar and hindfoot degenerative changes.
4. Foot and lower leg findings dictated separately.

## 2021-08-31 IMAGING — DX DG TIBIA/FIBULA 2V*L*
4 series · 4 of 4 positions shown · non-contrast
Comparison: None.

CLINICAL DATA: Anterior wound

EXAM:
LEFT TIBIA AND FIBULA - 2 VIEW

[tibia ap (1 of 2)]
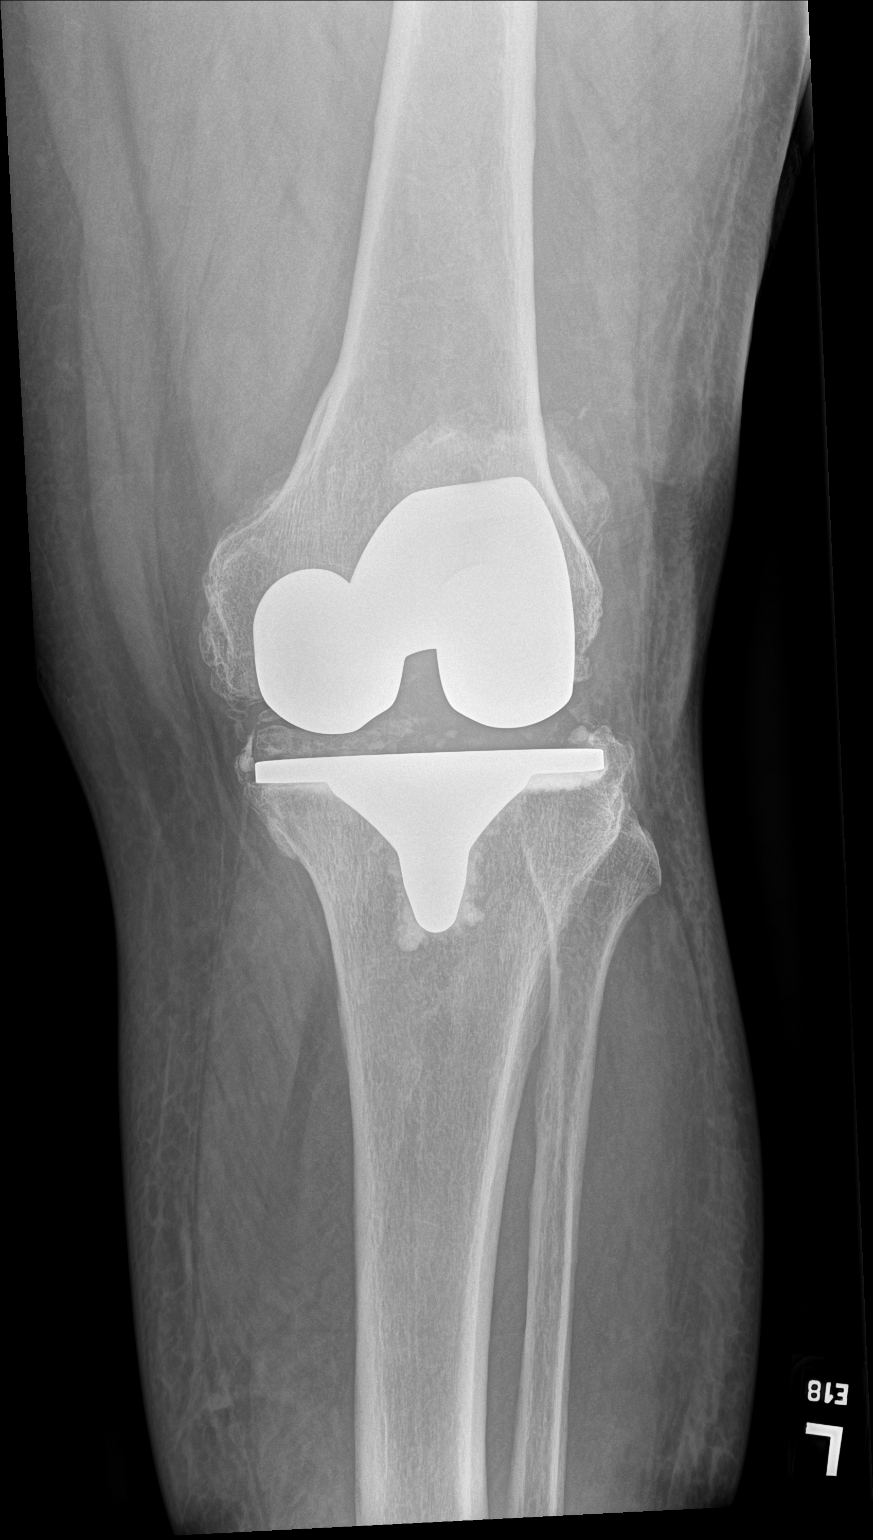

[tibia ap (2 of 2)]
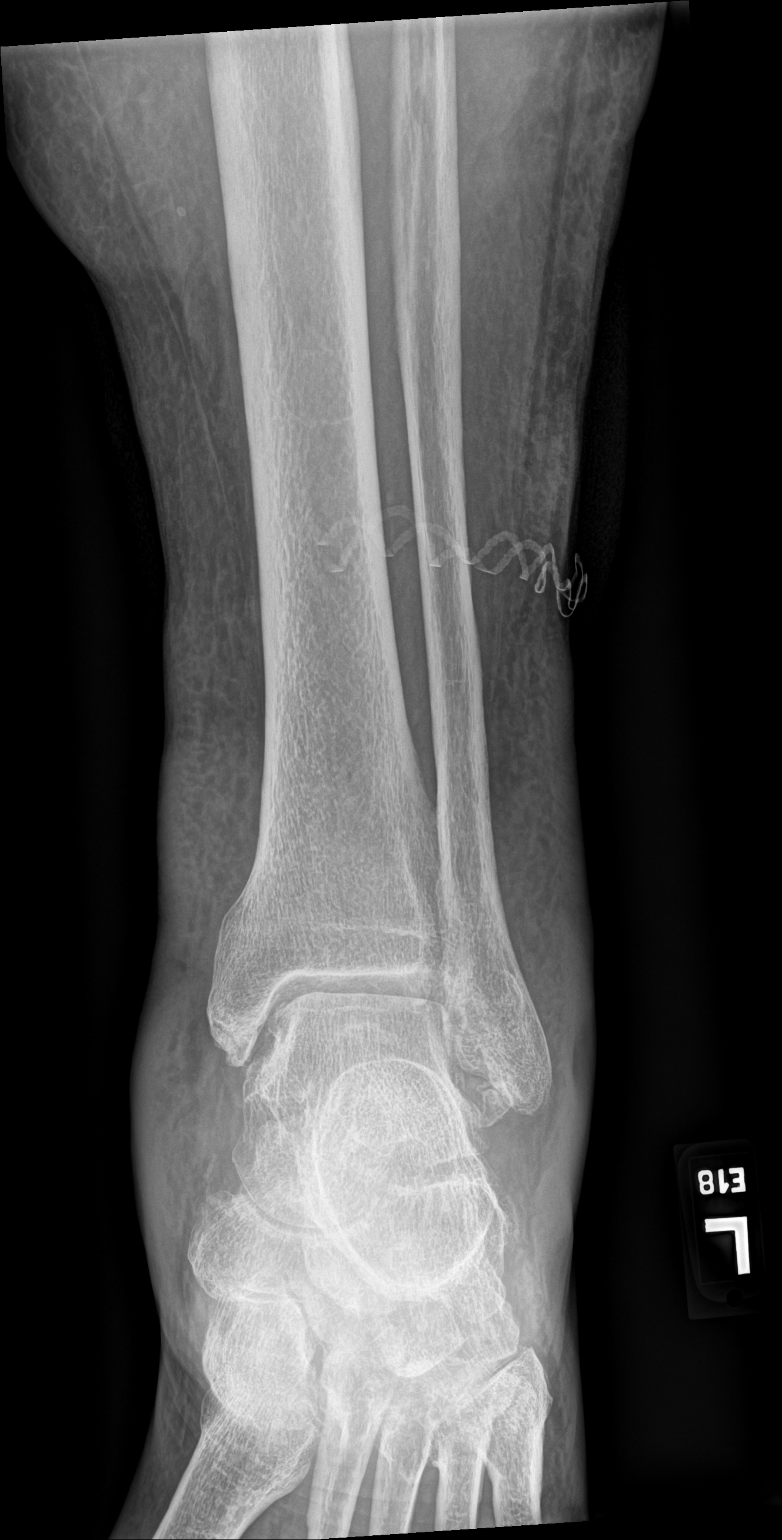

[tibia lat (1 of 2)]
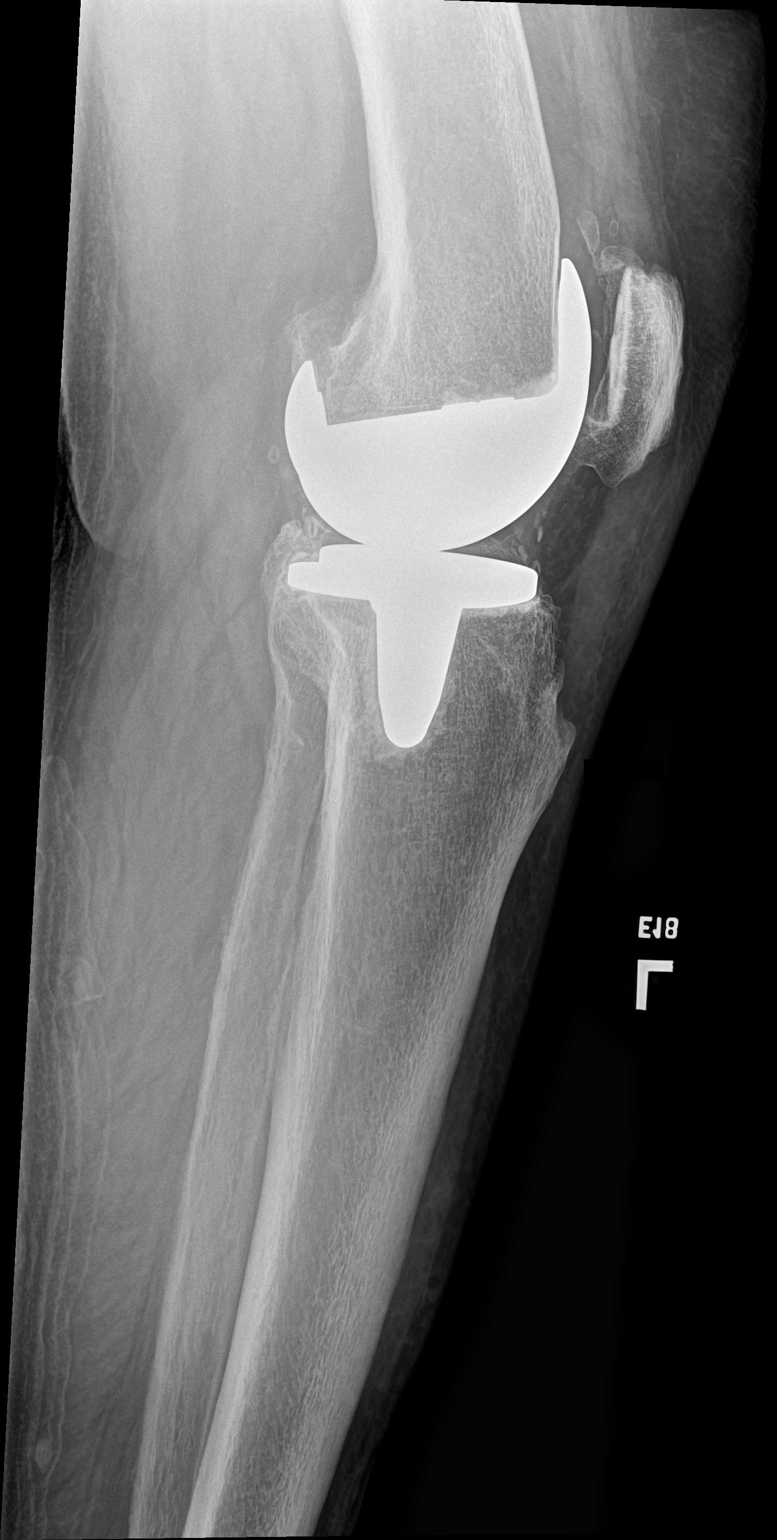

[tibia lat (2 of 2)]
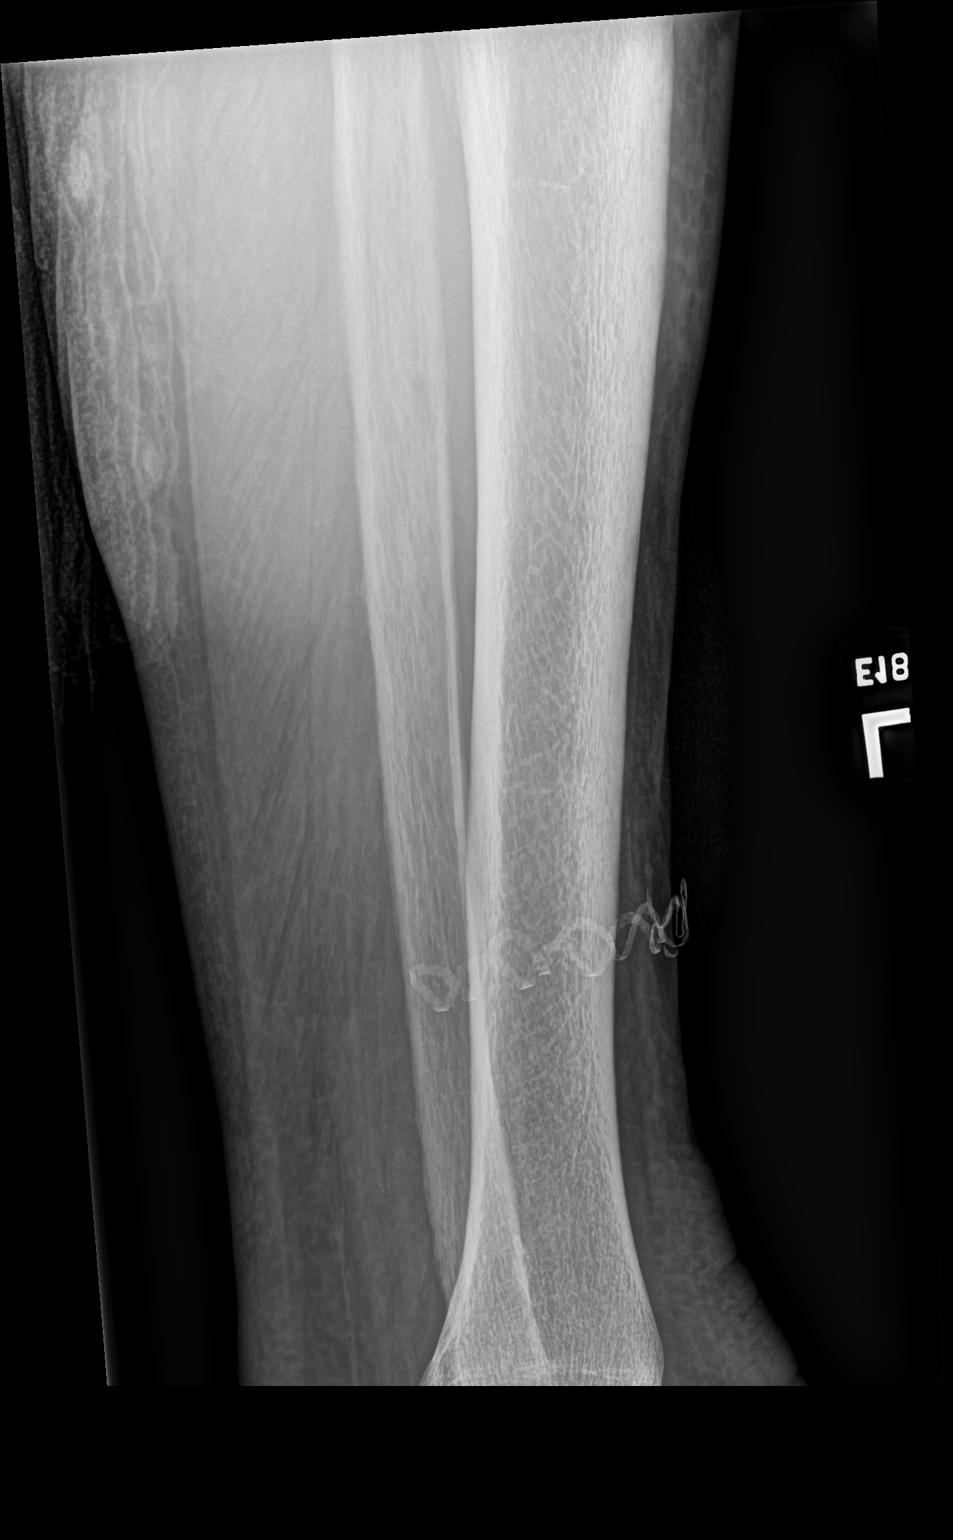

[4 of 4 positions shown; findings below may reference images not displayed]

FINDINGS: Left total knee prosthesis is well seated without periprosthetic
fracture or lucency. Well corticated ossific fragment adjacent to
the tip of the lateral malleolus likely sequelae of remote fracture.

Diffuse soft tissue edema. No osseous erosions identified to
indicate osteomyelitis.
IMPRESSION: Diffuse soft tissue edema of the left lower leg without underlying
acute osseous abnormality.

## 2021-08-31 IMAGING — MR MR [PERSON_NAME] LOW W/O CM*R*
6 series · 39 of 40 positions shown · non-contrast
Comparison: Radiographs [DATE].

CLINICAL DATA: Redness and inflammation in the right lower leg.
Soft tissue infection suspected.

EXAM:
MRI OF LOWER RIGHT EXTREMITY WITHOUT CONTRAST
TECHNIQUE: Multiplanar, multisequence MR imaging of the right lower leg was
performed. No intravenous contrast was administered.

[Series 6: t2_tse_sag fs_comp · sagittal · right · 5.0mm · 0.94mm/px · 4 of 36 slices shown]
[im 1/36]
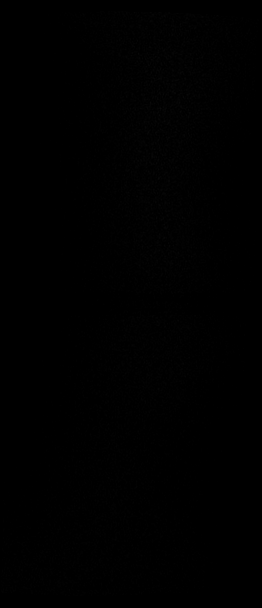
[im 12/36]
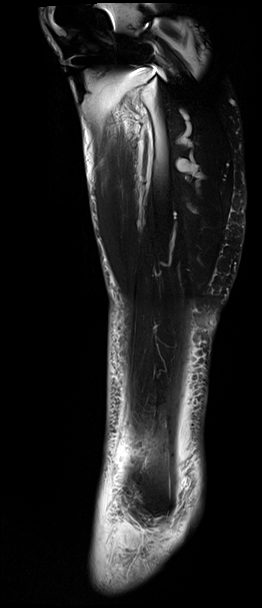
[im 24/36]
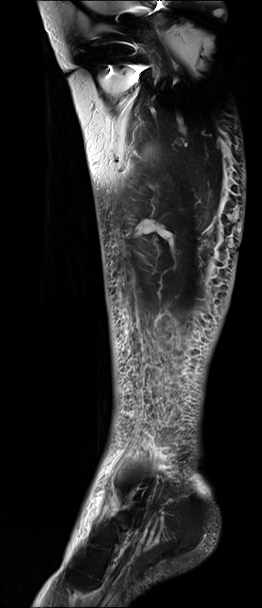
[im 36/36]
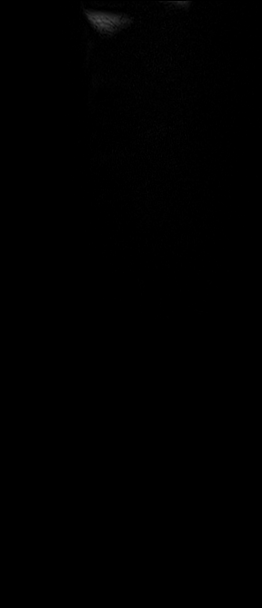

[Series 10: composed cor t1_comp_filt · coronal · right · 5.0mm · 1.08mm/px · 5 of 35 slices shown]
[im 1/35]
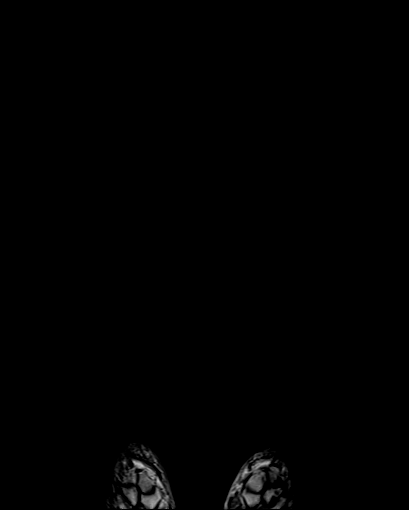
[im 9/35]
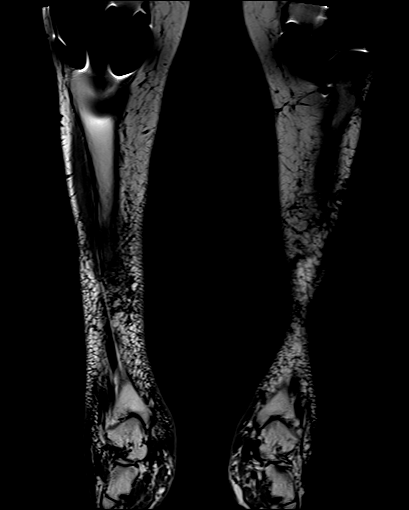
[im 18/35]
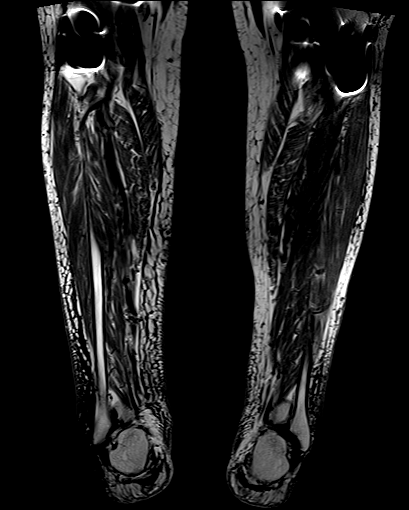
[im 26/35]
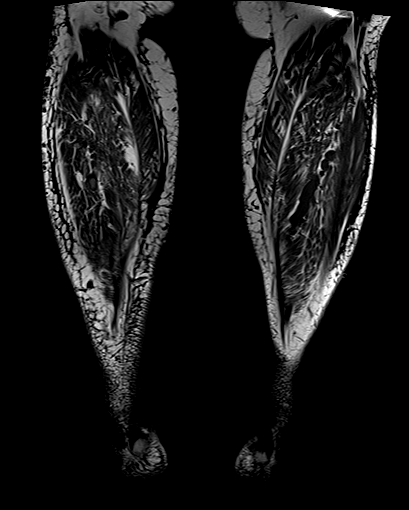
[im 35/35]
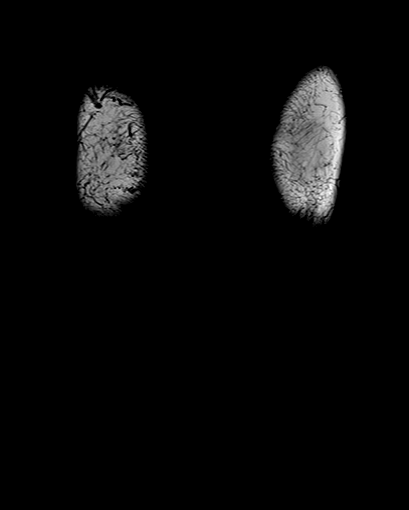

[Series 14: composed cor stir_comp_filt · coronal · right · 5.0mm · 1.08mm/px · 4 of 34 slices shown]
[im 1/34]
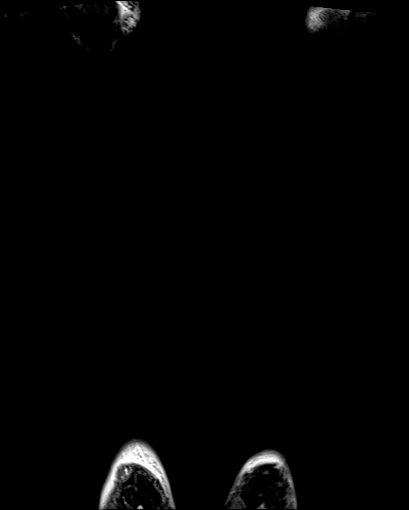
[im 12/34]
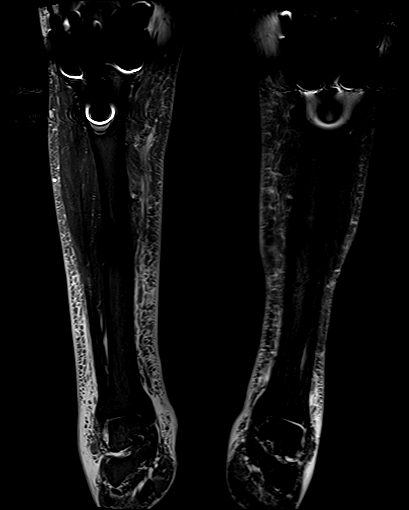
[im 23/34]
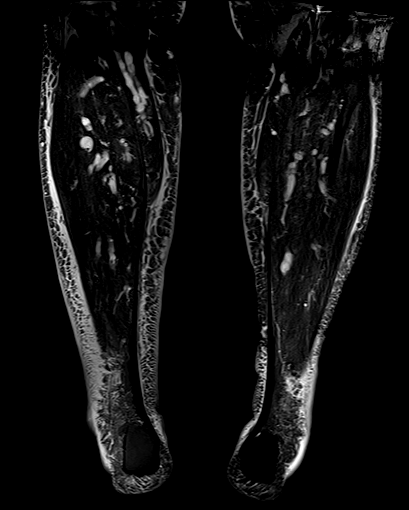
[im 34/34]
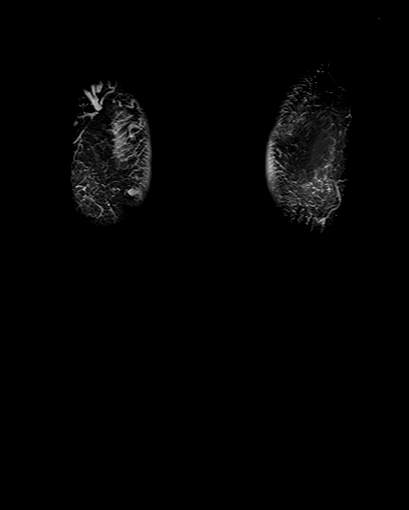

[Series 17: ax t1_comp · axial · right · 5.0mm · 0.86mm/px · z∈[-264,+246]mm · 11 of 84 slices shown]
[im 1/84]
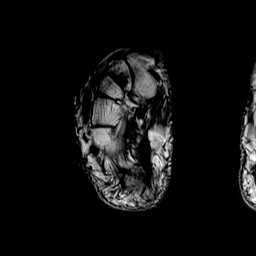
[im 9/84]
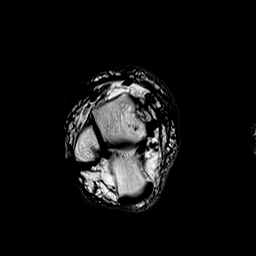
[im 17/84]
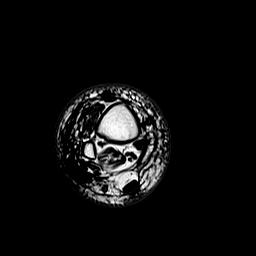
[im 25/84]
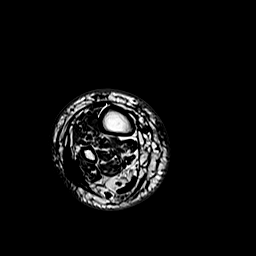
[im 34/84]
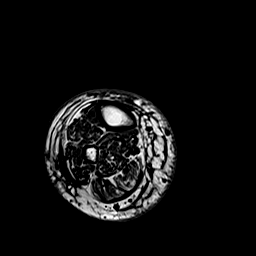
[im 42/84]
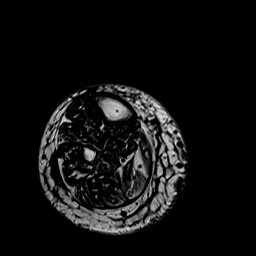
[im 50/84]
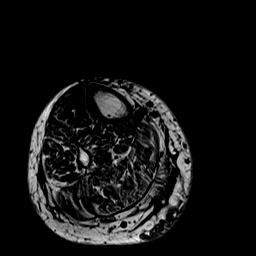
[im 59/84]
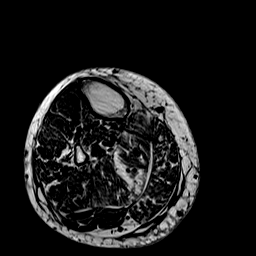
[im 67/84]
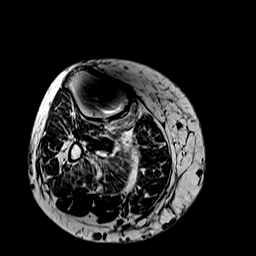
[im 75/84]
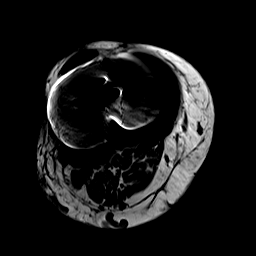
[im 84/84]
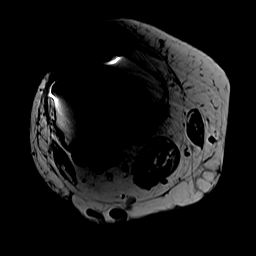

[Series 20: ax stir_comp_filt · axial · right · 5.0mm · 0.86mm/px · z∈[-264,+246]mm · 10 of 84 slices shown]
[im 1/84]
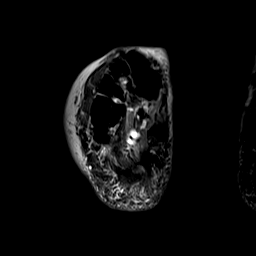
[im 9/84]
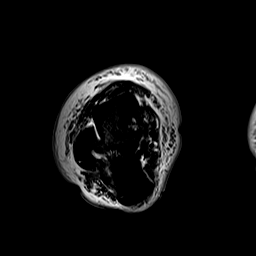
[im 17/84]
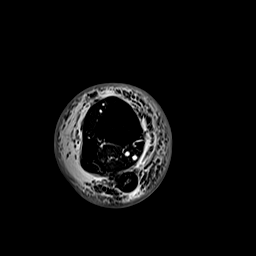
[im 25/84]
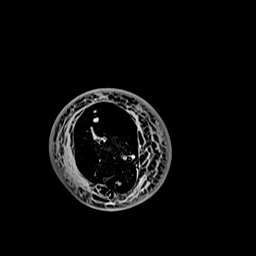
[im 34/84]
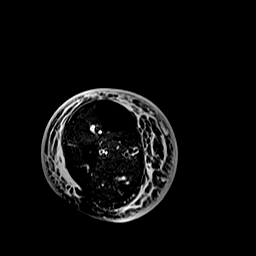
[im 42/84]
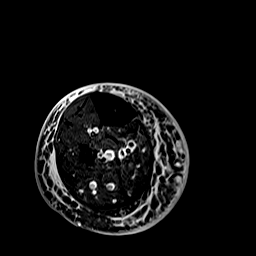
[im 50/84]
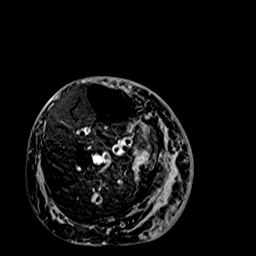
[im 59/84]
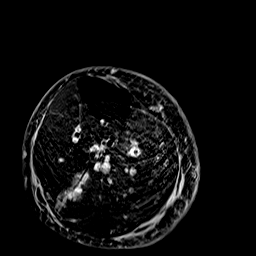
[im 67/84]
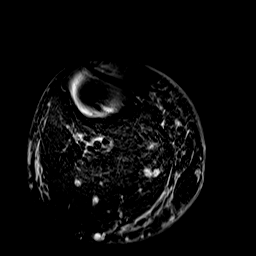
[im 84/84]
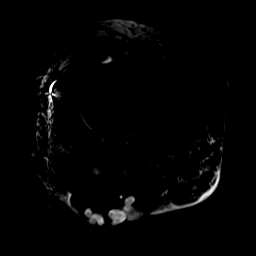

[Series 23: sag stir_comp_filt · sagittal · right · 5.0mm · 0.94mm/px · 5 of 37 slices shown]
[im 1/37]
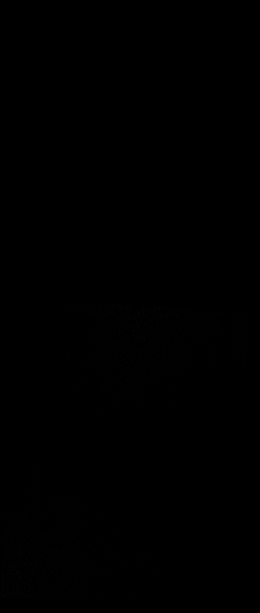
[im 10/37]
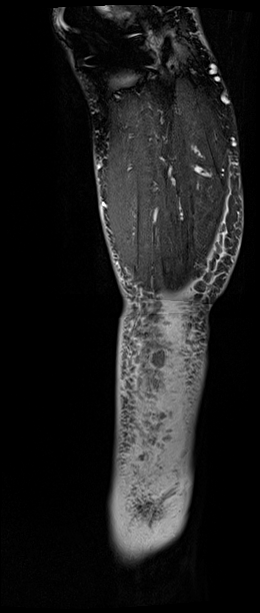
[im 19/37]
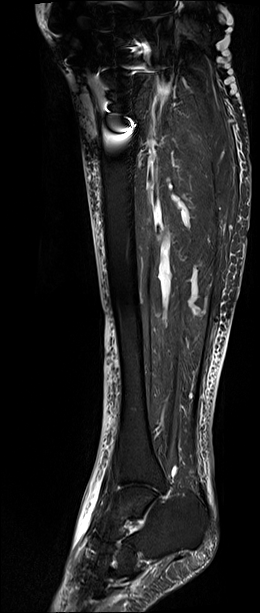
[im 28/37]
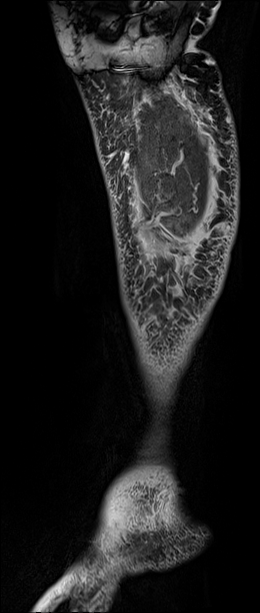
[im 37/37]
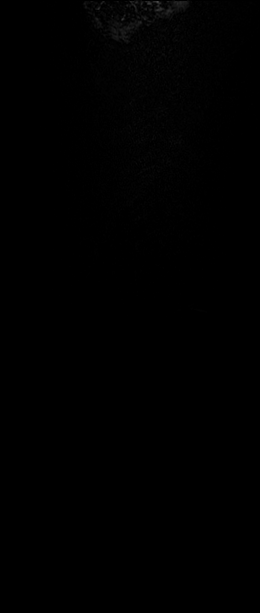

[39 of 40 positions shown; findings below may reference images not displayed]

FINDINGS: Bones/Joint/Cartilage

Both lower legs are included on the coronal images. Patient is
status post bilateral total knee arthroplasty. The right tibia and
fibula appear normal, without evidence of acute fracture,
dislocation or osteomyelitis. There is no large knee or ankle joint
effusion.

Ligaments

Status post right total knee arthroplasty. Ankle findings dictated
separately.

Muscles and Tendons

The right lower leg muscles appear unremarkable. The visualized
extensor mechanism at the right knee appears intact. The visualized
ankle tendons appear intact.

Soft tissues

There is generalized subcutaneous edema within both lower legs,
worse on the right. No focal fluid collections are identified on
noncontrast imaging. There are asymmetric varicosities posteriorly
at the right knee.
IMPRESSION: 1. Nonspecific generalized subcutaneous edema within both lower
legs, right greater than left. This could represent cellulitis,
venous insufficiency, lymphedema or heart failure. No focal fluid
collection identified.
2. No evidence of deep soft tissue infection, osteomyelitis or
significant joint effusion.
3. Ankle and foot findings dictated separately.

## 2021-08-31 IMAGING — DX DG TIBIA/FIBULA 2V*R*
4 series · 4 of 4 positions shown · non-contrast
Comparison: None.

CLINICAL DATA: Anterior leg wound.

EXAM:
RIGHT TIBIA AND FIBULA - 2 VIEW

[tibia ap (1 of 2)]
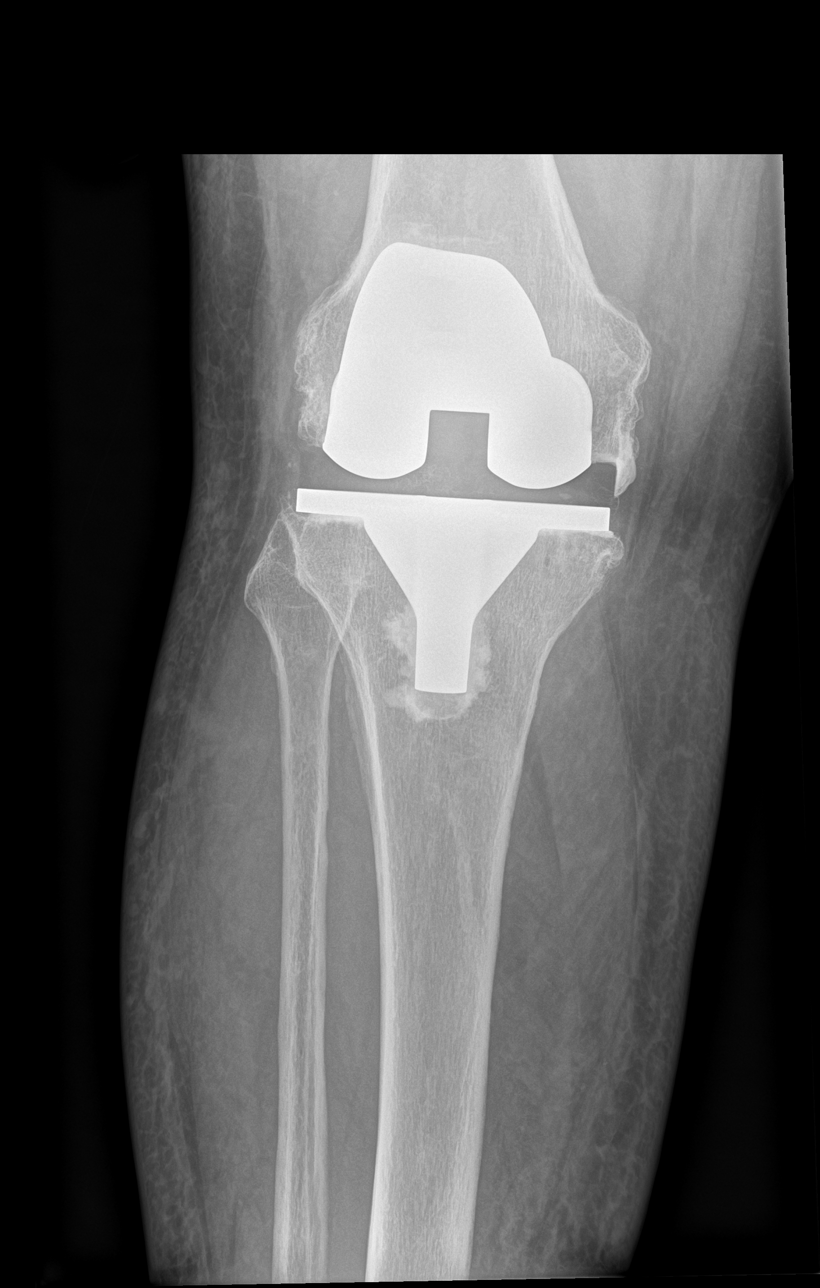

[tibia ap (2 of 2)]
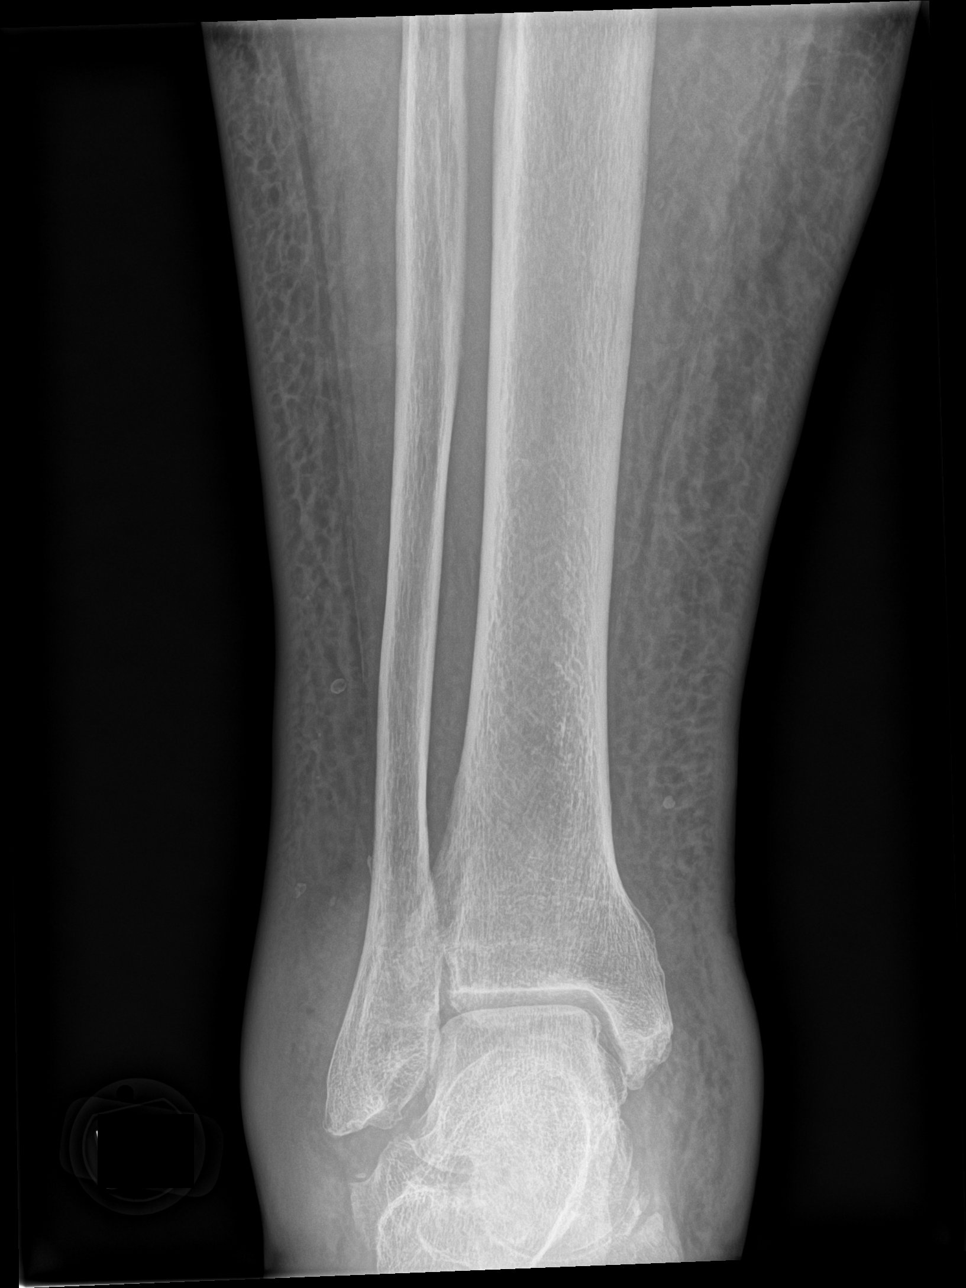

[tibia lat (1 of 2)]
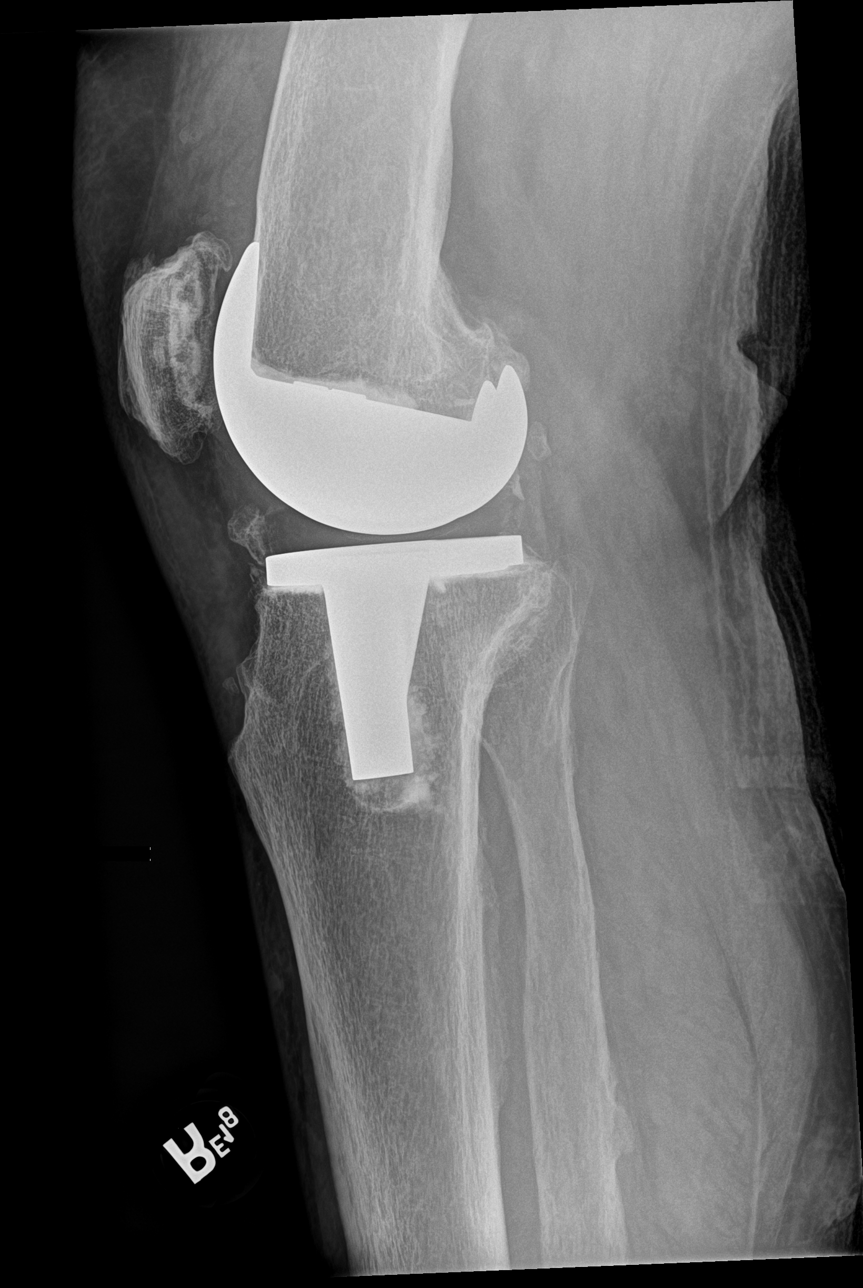

[tibia lat (2 of 2)]
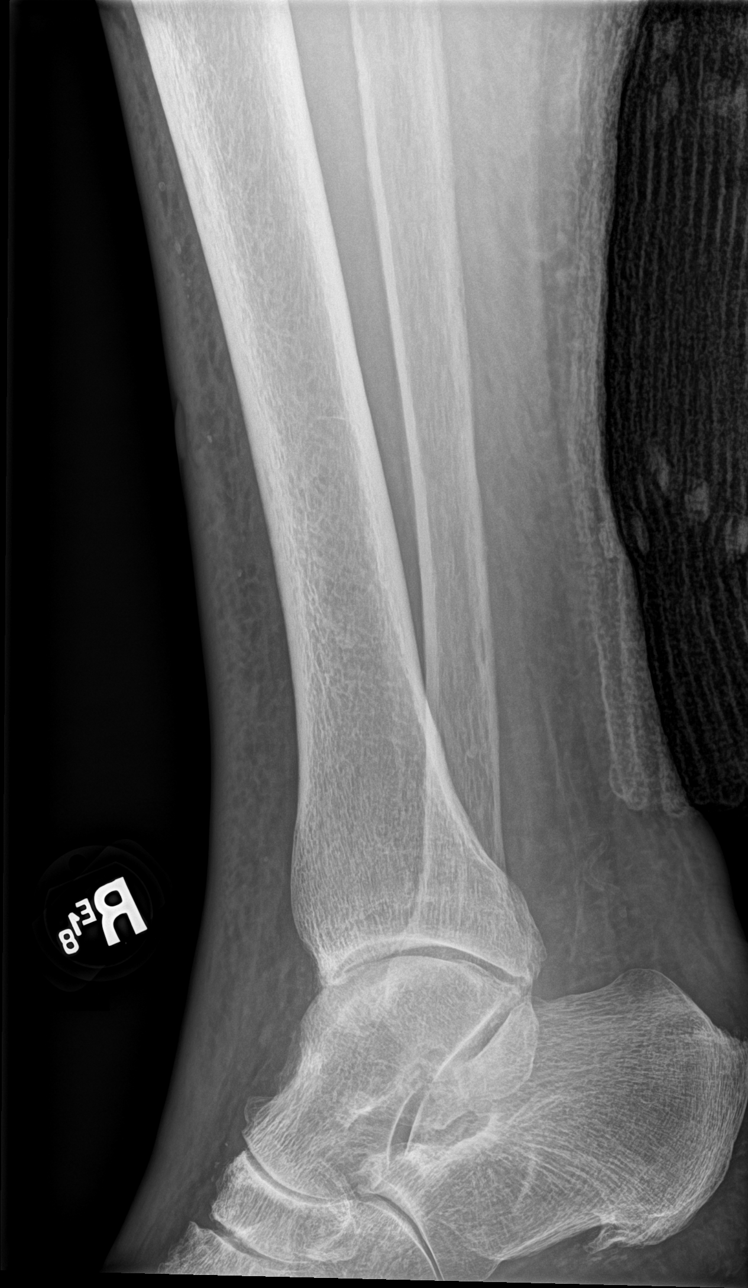

[4 of 4 positions shown; findings below may reference images not displayed]

FINDINGS: Right knee replacement in satisfactory position alignment. No
loosening or complication.

Negative for fracture of the tibia or fibula. No bone lesion or
osteomyelitis.

Degenerative changes present in the ankle joint with mild joint
space narrowing spurring. Diffuse soft tissue swelling around the
ankle.
IMPRESSION: Satisfactory right knee replacement

Mild ankle degenerative change. Diffuse soft tissue swelling around
the ankle

No acute skeletal finding.

## 2021-08-31 IMAGING — MR MR FOOT*R* W/O CM
5 series · 40 of 40 positions shown · non-contrast
Comparison: None.

CLINICAL DATA: Redness and inflammation in the right lower leg.
Soft tissue infection suspected. History of diabetes.

EXAM:
MRI OF THE RIGHT FOREFOOT WITHOUT CONTRAST
TECHNIQUE: Multiplanar, multisequence MR imaging of the right forefoot was
performed. No intravenous contrast was administered.

[Series 4: T1 · oblique · right · 3.0mm · 0.47mm/px · 11 of 52 slices shown (1 of 2)]
[im 1/52]
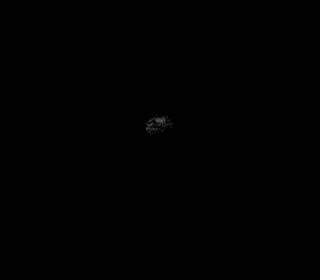
[im 6/52]
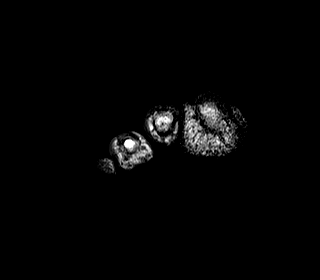
[im 11/52]
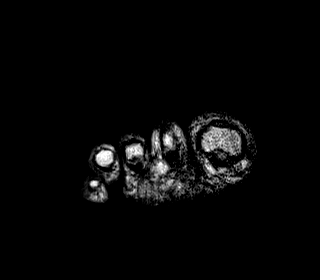
[im 16/52]
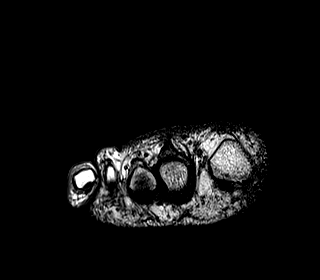
[im 21/52]
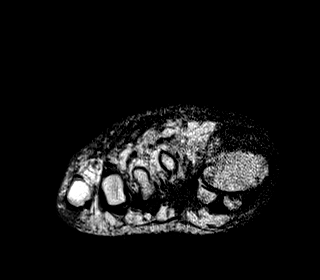
[im 26/52]
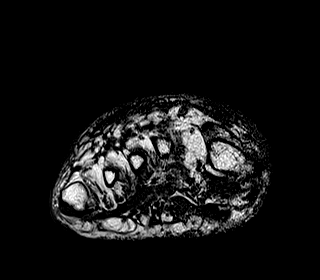
[im 31/52]
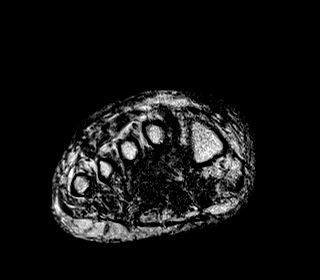
[im 36/52]
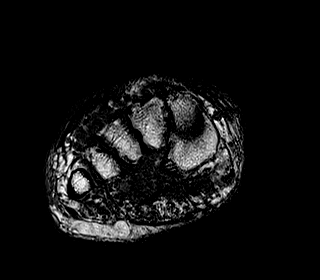
[im 41/52]
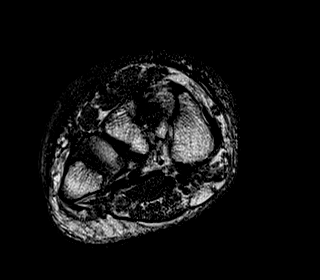
[im 46/52]
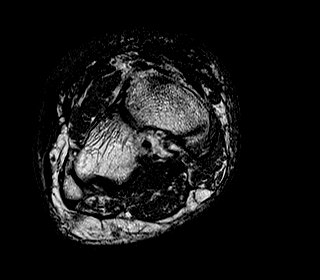
[im 52/52]
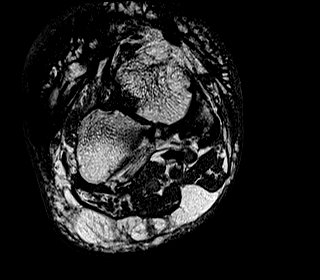

[Series 5: T2 fat-sat · oblique · right · 3.0mm · 0.47mm/px · 11 of 52 slices shown (1 of 2)]
[im 1/52]
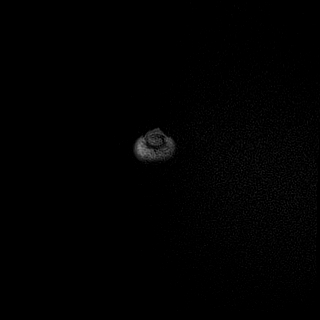
[im 6/52]
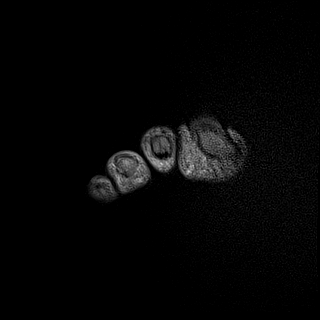
[im 11/52]
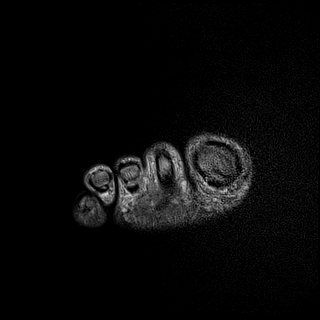
[im 16/52]
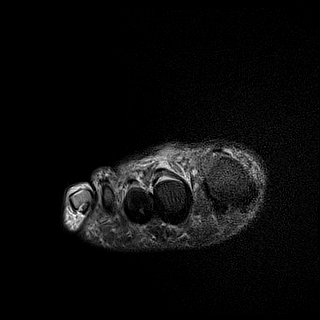
[im 21/52]
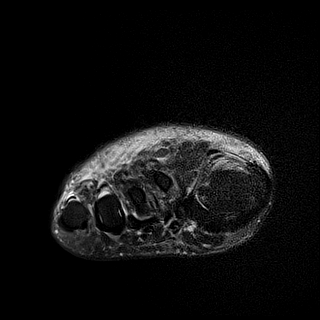
[im 26/52]
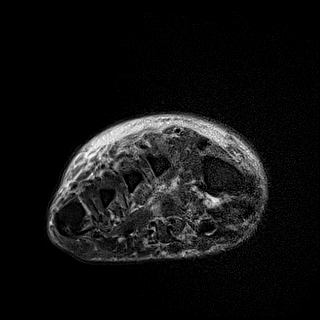
[im 31/52]
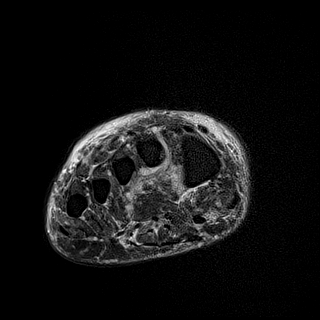
[im 36/52]
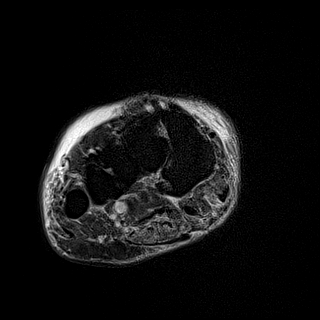
[im 41/52]
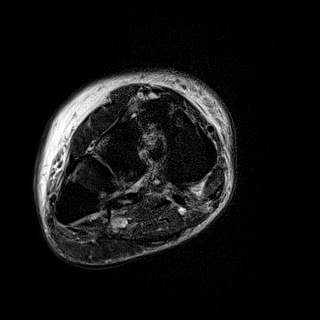
[im 46/52]
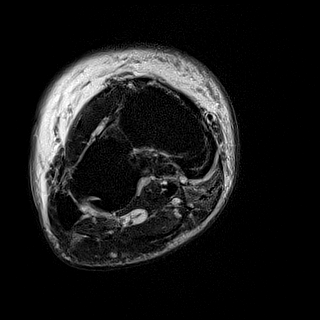
[im 52/52]
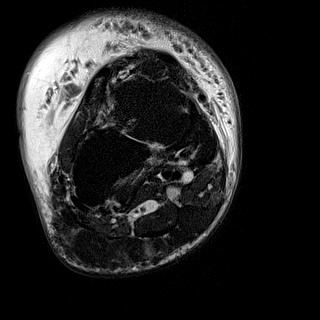

[Series 6: T1 · axial · right · 3.0mm · 0.70mm/px · z∈[-126,-43]mm · 5 of 25 slices shown (2 of 2)]
[im 1/25]
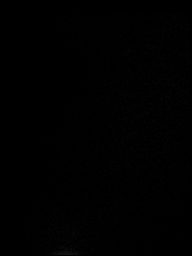
[im 7/25]
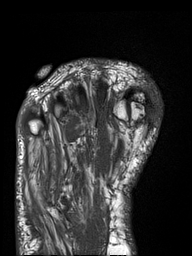
[im 13/25]
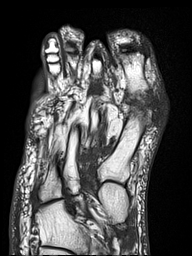
[im 19/25]
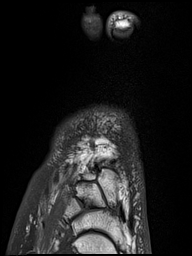
[im 25/25]
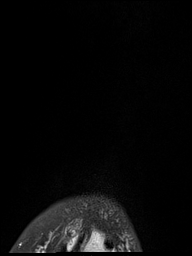

[Series 7: T2 fat-sat · axial · right · 3.0mm · 0.70mm/px · z∈[-131,-44]mm · 6 of 26 slices shown (2 of 2)]
[im 1/26]
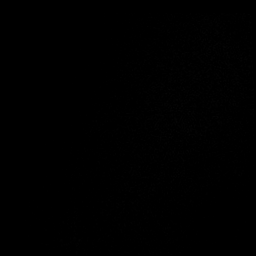
[im 6/26]
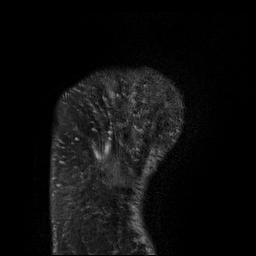
[im 11/26]
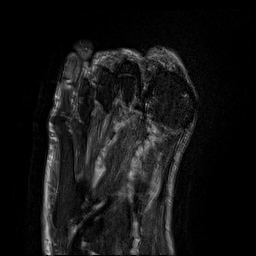
[im 16/26]
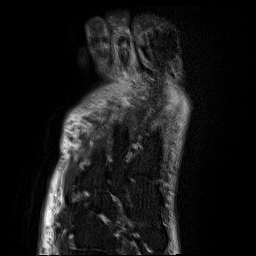
[im 21/26]
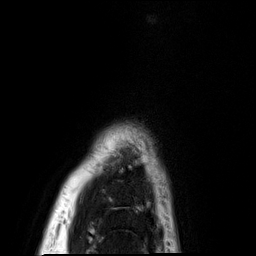
[im 26/26]
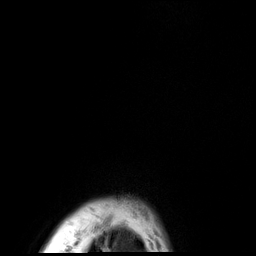

[Series 8: STIR · sagittal · right · 3.0mm · 0.70mm/px · 7 of 33 slices shown]
[im 1/33]
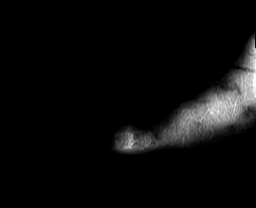
[im 6/33]
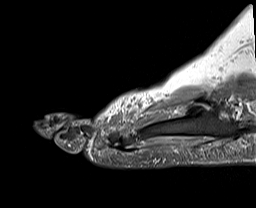
[im 11/33]
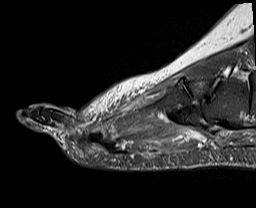
[im 17/33]
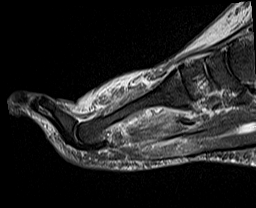
[im 22/33]
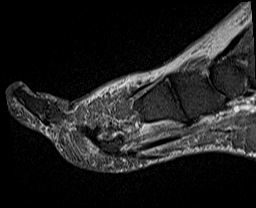
[im 27/33]
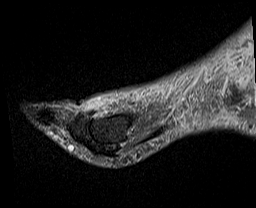
[im 33/33]
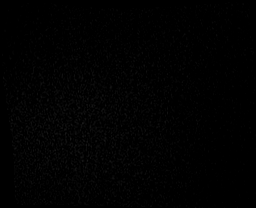

[40 of 40 positions shown; findings below may reference images not displayed]

FINDINGS: Despite efforts by the technologist and patient, mild motion
artifact is present on today's exam and could not be eliminated.
This reduces exam sensitivity and specificity.

Bones/Joint/Cartilage

No evidence of acute fracture, dislocation or bone destruction.
There are midfoot degenerative changes with scattered subchondral
cysts, largest in the middle cuneiform bone. The alignment is normal
at the Lisfranc joint. There are moderate degenerative changes at
the 1st metatarsophalangeal joint, associated with a hallux valgus
deformity. No significant joint effusions.

Ligaments

The Lisfranc ligament is intact. The collateral ligaments of the
metatarsophalangeal joints appear intact.

Muscles and Tendons

The forefoot muscles and tendons appear unremarkable. No significant
tenosynovitis.

Soft tissues

Generalized subcutaneous edema, greatest in the dorsal aspect of the
foot. No focal fluid collection or foreign body identified.
IMPRESSION: 1. Nonspecific dorsal subcutaneous edema as can be seen with
cellulitis, venous insufficiency, heart failure or lymphedema. No
focal fluid collection identified.
2. No evidence of focal fluid collection, deep soft tissue infection
or osteomyelitis.
3. Midfoot and 1st MTP degenerative changes.

## 2021-08-31 MED ORDER — VANCOMYCIN HCL 2000 MG/400ML IV SOLN
2000.0000 mg | Freq: Once | INTRAVENOUS | Status: AC
  Administered 2021-08-31: 2000 mg via INTRAVENOUS
  Filled 2021-08-31: qty 400

## 2021-08-31 MED ORDER — HYDROCODONE-ACETAMINOPHEN 5-325 MG PO TABS
1.0000 | ORAL_TABLET | Freq: Once | ORAL | Status: AC
  Administered 2021-08-31: 2 via ORAL
  Filled 2021-08-31: qty 2

## 2021-08-31 MED ORDER — MAGNESIUM SULFATE 2 GM/50ML IV SOLN
2.0000 g | Freq: Once | INTRAVENOUS | Status: AC
  Administered 2021-08-31: 2 g via INTRAVENOUS
  Filled 2021-08-31: qty 50

## 2021-08-31 MED ORDER — ACETAMINOPHEN 325 MG PO TABS: 650.0000 mg | ORAL_TABLET | Freq: Four times a day (QID) | ORAL | Status: DC | PRN

## 2021-08-31 MED ORDER — SODIUM CHLORIDE 0.9 % IV SOLN
2.0000 g | Freq: Three times a day (TID) | INTRAVENOUS | Status: DC
  Administered 2021-08-31 – 2021-09-03 (×8): 2 g via INTRAVENOUS
  Filled 2021-08-31 (×8): qty 2

## 2021-08-31 MED ORDER — HYDROCODONE-ACETAMINOPHEN 5-325 MG PO TABS: 1.0000 | ORAL_TABLET | ORAL | Status: DC | PRN

## 2021-08-31 MED ORDER — FLUOXETINE HCL 20 MG PO CAPS
20.0000 mg | ORAL_CAPSULE | Freq: Every day | ORAL | Status: DC
  Administered 2021-09-01 – 2021-09-03 (×3): 20 mg via ORAL
  Filled 2021-08-31 (×4): qty 1

## 2021-08-31 MED ORDER — ACETAMINOPHEN 650 MG RE SUPP: 650.0000 mg | Freq: Four times a day (QID) | RECTAL | Status: DC | PRN

## 2021-08-31 MED ORDER — HYDROCODONE-ACETAMINOPHEN 5-325 MG PO TABS: 1.0000 | ORAL_TABLET | Freq: Once | ORAL | Status: DC

## 2021-08-31 MED ORDER — VANCOMYCIN HCL 1250 MG/250ML IV SOLN
1250.0000 mg | Freq: Two times a day (BID) | INTRAVENOUS | Status: DC
  Administered 2021-09-01 – 2021-09-03 (×5): 1250 mg via INTRAVENOUS
  Filled 2021-08-31 (×5): qty 250

## 2021-08-31 MED ORDER — SODIUM CHLORIDE 0.9 % IV SOLN
2.0000 g | Freq: Once | INTRAVENOUS | Status: AC
  Administered 2021-08-31: 2 g via INTRAVENOUS
  Filled 2021-08-31: qty 2

## 2021-08-31 MED ORDER — PANTOPRAZOLE SODIUM 40 MG PO TBEC
40.0000 mg | DELAYED_RELEASE_TABLET | Freq: Every day | ORAL | Status: DC
  Administered 2021-08-31 – 2021-09-03 (×4): 40 mg via ORAL
  Filled 2021-08-31 (×4): qty 1

## 2021-08-31 MED ORDER — ATENOLOL 25 MG PO TABS
12.5000 mg | ORAL_TABLET | Freq: Every day | ORAL | Status: DC
  Administered 2021-08-31 – 2021-09-02 (×3): 12.5 mg via ORAL
  Filled 2021-08-31 (×3): qty 1

## 2021-08-31 MED ORDER — ALLOPURINOL 300 MG PO TABS
300.0000 mg | ORAL_TABLET | Freq: Every day | ORAL | Status: DC
  Administered 2021-08-31 – 2021-09-03 (×4): 300 mg via ORAL
  Filled 2021-08-31 (×3): qty 1
  Filled 2021-08-31: qty 3

## 2021-08-31 MED ORDER — TETANUS-DIPHTH-ACELL PERTUSSIS 5-2.5-18.5 LF-MCG/0.5 IM SUSY
0.5000 mL | PREFILLED_SYRINGE | Freq: Once | INTRAMUSCULAR | Status: AC
  Administered 2021-08-31: 0.5 mL via INTRAMUSCULAR
  Filled 2021-08-31: qty 0.5

## 2021-08-31 MED ORDER — ONDANSETRON HCL 4 MG PO TABS: 4.0000 mg | ORAL_TABLET | Freq: Four times a day (QID) | ORAL | Status: DC | PRN

## 2021-08-31 MED ORDER — METRONIDAZOLE 500 MG/100ML IV SOLN
500.0000 mg | Freq: Once | INTRAVENOUS | Status: AC
  Administered 2021-08-31: 500 mg via INTRAVENOUS
  Filled 2021-08-31: qty 100

## 2021-08-31 MED ORDER — METHYLPREDNISOLONE SODIUM SUCC 40 MG IJ SOLR
40.0000 mg | Freq: Every day | INTRAMUSCULAR | Status: DC
  Administered 2021-08-31 – 2021-09-03 (×4): 40 mg via INTRAVENOUS
  Filled 2021-08-31 (×4): qty 1

## 2021-08-31 MED ORDER — ONDANSETRON HCL 4 MG/2ML IJ SOLN: 4.0000 mg | Freq: Four times a day (QID) | INTRAMUSCULAR | Status: DC | PRN

## 2021-08-31 MED ORDER — TAMSULOSIN HCL 0.4 MG PO CAPS
0.4000 mg | ORAL_CAPSULE | Freq: Every day | ORAL | Status: DC
  Administered 2021-08-31 – 2021-09-02 (×3): 0.4 mg via ORAL
  Filled 2021-08-31 (×3): qty 1

## 2021-08-31 MED ORDER — VANCOMYCIN HCL IN DEXTROSE 1-5 GM/200ML-% IV SOLN
1000.0000 mg | Freq: Once | INTRAVENOUS | Status: DC
  Filled 2021-08-31: qty 200

## 2021-08-31 MED ORDER — POTASSIUM CHLORIDE CRYS ER 20 MEQ PO TBCR
40.0000 meq | EXTENDED_RELEASE_TABLET | Freq: Once | ORAL | Status: AC
  Administered 2021-08-31: 40 meq via ORAL
  Filled 2021-08-31: qty 2

## 2021-08-31 MED ORDER — LACTATED RINGERS IV SOLN: INTRAVENOUS | Status: AC

## 2021-08-31 NOTE — ED Triage Notes (Signed)
Pt reports cutting his left leg on a piece of wood and can not get the bleeding to stop. Pt also reports redness and inflammation to the right lower leg.

## 2021-08-31 NOTE — ED Notes (Signed)
Xray in room at this time.

## 2021-08-31 NOTE — ED Notes (Signed)
Attempt to draw second blood cultures off second IV, unable to obtain.  IV flushes well and started IV antibiotics

## 2021-08-31 NOTE — ED Notes (Signed)
Pt with bilateral lower leg swelling.  Redness noted to right LE.  Chills last night per pt. Wounds to bilateral LE's

## 2021-08-31 NOTE — Progress Notes (Signed)
Refused CPAP , will notify if changes mind

## 2021-08-31 NOTE — H&P (Signed)
History and Physical  Evan Warner ZOX:[id number] DOB: [birth year] DOA: 08/31/2021   PCP: Pcp, No   Patient coming from: Home  Chief Complaint: right leg pain  HPI:  Evan Warner is a 70 y.o. male with medical history of impaired glucose tolerance, myasthenia gravis, hypertension, atrial fibrillation, hyperlipidemia, BPH presenting with right leg pain, swelling, and erythema.  The patient states that he accidentally cut his right pretibial area with the edge of a mirror about 2 weeks prior to this admission.  He has noted some clear fluid drainage from the wound but has not noted any pus or blood.  Over this period of time he has noted some increasing swelling as well as some mild redness.  However over the past 24 hours he has noticed a significant increase in his right lower extremity and right foot erythema, pain, and edema.  As result, the patient presented for further evaluation.  He had subjective fevers and chills on the evening of 08/30/2021.  He denies any headache, neck pain, chest pain, shortness breath, cough, hemoptysis, nausea, vomiting, diarrhea, abdominal pain, dysuria, hematuria. Notably, the patient states that he experienced a accidental cut on his left leg on the edge of a table on 08/30/2021 and had difficulty getting it to stop bleeding.  He denies any increasing erythema, redness, or pain in the left lower extremity.  He has not been on any recent antibiotics.  The patient receives all his care at the [facility]. In the emergency department, the patient was afebrile hemodynamically stable with oxygen saturation 93-94% on room air.  BMP showed sodium 140, potassium 2.9, serum creatinine 1.13.  AST 17, ALT 21, alk phosphatase 57, total bilirubin 1.6, albumin 3.8.  WBC 15.7, hemoglobin 14.2, platelets 175,000.  Lactic acid 1.5.  EKG showed atrial fibrillation with nonspecific ST changes.  X-rays of the right tib-fib area showed diffuse soft tissue swelling without any osseous  abnormalities.  Assessment/Plan: Sepsis -present on admssion -presented with leukocytosis and tachypnea -Lactic acid 1.5 -Check PCT -Continue empiric vancomycin and cefepime -Follow blood cultures  Cellulitis right lower extremity -Check ESR -Check CRP -MRI right lower extremity  Myasthenia gravis -Patient is currently on prednisone 30 mg daily -Start patient on Solu-Medrol 40 mg IV daily for stress dosing  Impaired glucose tolerance -Review of the record showed that the patient had hemoglobin A1c of 6.1 on 04/02/2021 -Repeat hemoglobin A1c -Anticipate elevated CBGs secondary to steroids -NovoLog sliding scale  Essential hypertension -Holding lisinopril HCTZ and nifedipine secondary to soft blood pressure  Hyperlipidemia -Continue statin  Atrial fibrillation, type unspecified -Holding Xarelto temporarily -Restart atenolol lower dose due to soft BPs  Depression/anxiety -Continue fluoxetine  Gouty arthritis -Continue allopurinol -Check uric acid  BPH -Continue tamsulosin  Morbid Obesity -BMI 48.02 -lifestyle modification  Hypokalemia -replete -check mag     Past Medical History:  Diagnosis Date   A-fib ([place])    chronic afib history per 02/20/16 [facility]- cardioloy note   Arthritis    Coronary atherosclerosis of native coronary artery    DES ramus 2005, LVEF 55%.Takes Pradaxa daily   Enlarged prostate    takes Flomax nightly   Essential hypertension, benign    takes Atenolol,Lisinipril-HCTZ,and Procardia daily   GERD (gastroesophageal reflux disease)    takes Pantoprazole daily   Gout    takes Allopurinol daily   History of kidney stones    Hyperlipidemia    takes Simvastatin daily   Joint pain    Joint swelling  MI (myocardial infarction) ([place])    [facility] 2005   Mood disorder [facility])    takes Fluoxetine daily   Myasthenia gravis ([facility])    Nocturia    OSA (obstructive sleep apnea)    On CPAP   Peripheral edema     occasionally   Pneumonia    history of    Urinary frequency    Past Surgical History:  Procedure Laterality Date   ANKLE ARTHROSCOPY     Left   BACK SURGERY     CARDIAC CATHETERIZATION  2005   CORONARY ANGIOPLASTY     1 stent   skin tags removed     TOTAL KNEE ARTHROPLASTY     Right   TOTAL KNEE ARTHROPLASTY Left 07/31/2016   Procedure: LEFT TOTAL KNEE ARTHROPLASTY;  Surgeon: Marybelle Killings, MD;  Location: [facility];  Service: Orthopedics;  Laterality: Left;   Social History:  reports that he has quit smoking. His smoking use included cigarettes. He has never used smokeless tobacco. He reports current alcohol use. He reports that he does not use drugs.   No family history on file.   Allergies  Allergen Reactions   No Known Allergies      Prior to Admission medications   Medication Sig Start Date End Date Taking? Authorizing Provider  allopurinol (ZYLOPRIM) 300 MG tablet Take 300 mg by mouth daily.    [provider]  atenolol (TENORMIN) 50 MG tablet TAKE 1 TABLET BY MOUTH EVERY DAY 02/25/12   Yehuda Savannah, MD  Coenzyme Q-10 100 MG capsule Take 100 mg by mouth daily.      [provider]  dabigatran (PRADAXA) 150 MG CAPS capsule Take 150 mg by mouth 2 (two) times daily.    [provider]  docusate sodium (COLACE) 100 MG capsule Take 1 capsule (100 mg total) by mouth 2 (two) times daily. 08/02/16   Lanae Crumbly, PA-C  fish oil-omega-3 fatty acids 1000 MG capsule Take 1 capsule by mouth daily.      [provider]  FLUoxetine (PROZAC) 20 MG capsule Take 20 mg by mouth daily.    [provider]  lisinopril-hydrochlorothiazide (PRINZIDE,ZESTORETIC) 20-12.5 MG tablet Take 1 tablet by mouth daily.    [provider]  methocarbamol (ROBAXIN) 500 MG tablet Take 1 tablet (500 mg total) by mouth every 6 (six) hours as needed for muscle spasms. 08/02/16   Lanae Crumbly, PA-C  NIFEdipine (PROCARDIA-XL/ADALAT CC) 30 MG 24 hr tablet  Take 30 mg by mouth daily.    [provider]  nitroGLYCERIN (NITROSTAT) 0.4 MG SL tablet Place 0.4 mg under the tongue as needed.      [provider]  NON FORMULARY c-papa  And O2    [provider]  pantoprazole (PROTONIX) 40 MG tablet Take 40 mg by mouth daily.    [provider]  simvastatin (ZOCOR) 80 MG tablet Take 80 mg by mouth daily.    [provider]  Tamsulosin HCl (FLOMAX) 0.4 MG CAPS Take 0.4 mg by mouth at bedtime.      [provider]    Review of Systems:  Constitutional:  No weight loss, night sweats,  Head&Eyes: No headache.  No vision loss.  No eye pain or scotoma ENT:  No Difficulty swallowing,Tooth/dental problems,Sore throat,  No ear ache, post nasal drip,  Cardio-vascular:  No chest pain, Orthopnea, PND, swelling in lower extremities,  dizziness, palpitations  GI:  No  abdominal pain, nausea,  vomiting, diarrhea, loss of appetite, hematochezia, melena, heartburn, indigestion, Resp:  No shortness of breath with exertion or at rest. No cough. No coughing up of blood .No wheezing.No chest wall deformity  Skin:  no rash or lesions.  GU:  no dysuria, change in color of urine, no urgency or frequency. No flank pain.  Musculoskeletal:  Complains of right leg pain and swelling and erythema. Psych:  No change in mood or affect. No depression or anxiety. Neurologic: No headache, no dysesthesia, no focal weakness, no vision loss. No syncope  Physical Exam: Vitals:   08/31/21 1415 08/31/21 1430 08/31/21 1445 08/31/21 1500  BP: 108/68 112/62  122/75  Pulse: 84 80 73 85  Resp: (!) 24 (!) 21 (!) 25 16  Temp:      TempSrc:      SpO2: 95% 94% 95% 95%  Weight:      Height:       General:  A&O x 3, NAD, nontoxic, pleasant/cooperative Head/Eye: No conjunctival hemorrhage, no icterus, [place]/AT, No nystagmus ENT:  No icterus,  No thrush, good dentition, no pharyngeal exudate Neck:  No masses, no lymphadenpathy, no  bruits CV:  RRR, no rub, no gallop, no S3 Lung:  CTAB, good air movement, no wheeze, no rhonchi Abdomen: soft/NT, +BS, nondistended, no peritoneal signs Ext: Bilateral lower extremity edema.  Erythema of the right lower extremity from the patient's midfoot to the infrapatellar area.  There is no crepitance.  They are blanchable and nonblanchable components of erythema.  No necrosis.  Clear serous drainage from pretibial wound on the right. Neuro: CNII-XII intact, strength 4/5 in bilateral upper and lower extremities, no dysmetria  Labs on Admission:  Basic Metabolic Panel: Recent Labs  Lab 08/31/21 1130  NA 140  K 2.9*  CL 102  CO2 30  GLUCOSE 114*  BUN 20  CREATININE 1.13  CALCIUM 8.8*   Liver Function Tests: Recent Labs  Lab 08/31/21 1130  AST 17  ALT 21  ALKPHOS 57  BILITOT 1.6*  PROT 6.7  ALBUMIN 3.8   No results for input(s): LIPASE, AMYLASE in the last 168 hours. No results for input(s): AMMONIA in the last 168 hours. CBC: Recent Labs  Lab 08/31/21 1130  WBC 15.7*  NEUTROABS 13.1*  HGB 14.2  HCT 43.2  MCV 97.5  PLT 175   Coagulation Profile: Recent Labs  Lab 08/31/21 1130  INR 1.3*   Cardiac Enzymes: No results for input(s): CKTOTAL, CKMB, CKMBINDEX, TROPONINI in the last 168 hours. BNP: Invalid input(s): POCBNP CBG: No results for input(s): GLUCAP in the last 168 hours. Urine analysis:    Component Value Date/Time   COLORURINE YELLOW 07/25/2016 [place] 07/25/2016 1431   LABSPEC 1.018 07/25/2016 1431   PHURINE 6.5 07/25/2016 1431   GLUCOSEU NEGATIVE 07/25/2016 1431   HGBUR MODERATE (A) 07/25/2016 1431   BILIRUBINUR NEGATIVE 07/25/2016 1431   KETONESUR NEGATIVE 07/25/2016 1431   PROTEINUR NEGATIVE 07/25/2016 1431   UROBILINOGEN 0.2 09/10/2010 1331   NITRITE NEGATIVE 07/25/2016 1431   LEUKOCYTESUR NEGATIVE 07/25/2016 1431   Sepsis Labs: _0 (procalcitonin:4,lacticidven:4) ) Recent Results (from the past 240  hour(s))  Blood Culture (routine x 2)     Status: None (Preliminary result)   Collection Time: 08/31/21 11:30 AM   Specimen: Right Antecubital; Blood  Result Value Ref Range Status   Specimen Description   Final    RIGHT ANTECUBITAL BOTTLES DRAWN AEROBIC AND ANAEROBIC   Special Requests   Final    Blood  Culture results may not be optimal due to an excessive volume of blood received in culture bottles Performed at [facility], [address], [place], [place] [address]    Culture PENDING  Incomplete   Report Status PENDING  Incomplete  Resp Panel by RT-PCR (Flu A&B, Covid) Nasopharyngeal Swab     Status: None   Collection Time: 08/31/21 11:54 AM   Specimen: Nasopharyngeal Swab; Nasopharyngeal(NP) swabs in vial transport medium  Result Value Ref Range Status   SARS Coronavirus 2 by RT PCR NEGATIVE NEGATIVE Final    Comment: (NOTE) SARS-CoV-2 target nucleic acids are NOT DETECTED.  The SARS-CoV-2 RNA is generally detectable in upper respiratory specimens during the acute phase of infection. The lowest concentration of SARS-CoV-2 viral copies this assay can detect is 138 copies/mL. A negative result does not preclude SARS-Cov-2 infection and should not be used as the sole basis for treatment or other patient management decisions. A negative result may occur with  improper specimen collection/handling, submission of specimen other than nasopharyngeal swab, presence of viral mutation(s) within the areas targeted by this assay, and inadequate number of viral copies(<138 copies/mL). A negative result must be combined with clinical observations, patient history, and epidemiological information. The expected result is Negative.  Fact Sheet for Patients:  EntrepreneurPulse.com.au  Fact Sheet for Healthcare Providers:  IncredibleEmployment.be  This test is no t yet approved or cleared by the [place] FDA and  has been authorized for detection  and/or diagnosis of SARS-CoV-2 by FDA under an Emergency Use Authorization (EUA). This EUA will remain  in effect (meaning this test can be used) for the duration of the COVID-19 declaration under Section 564(b)(1) of the Act, 21 U.S.C.section 360bbb-3(b)(1), unless the authorization is terminated  or revoked sooner.       Influenza A by PCR NEGATIVE NEGATIVE Final   Influenza B by PCR NEGATIVE NEGATIVE Final    Comment: (NOTE) The Xpert Xpress SARS-CoV-2/FLU/RSV plus assay is intended as an aid in the diagnosis of influenza from Nasopharyngeal swab specimens and should not be used as a sole basis for treatment. Nasal washings and aspirates are unacceptable for Xpert Xpress SARS-CoV-2/FLU/RSV testing.  Fact Sheet for Patients: EntrepreneurPulse.com.au  Fact Sheet for Healthcare Providers: IncredibleEmployment.be  This test is not yet approved or cleared by the [place] FDA and has been authorized for detection and/or diagnosis of SARS-CoV-2 by FDA under an Emergency Use Authorization (EUA). This EUA will remain in effect (meaning this test can be used) for the duration of the COVID-19 declaration under Section 564(b)(1) of the Act, 21 U.S.C. section 360bbb-3(b)(1), unless the authorization is terminated or revoked.  Performed at [facility], [address] [place], [place] [address]   Blood Culture (routine x 2)     Status: None (Preliminary result)   Collection Time: 08/31/21 12:36 PM   Specimen: BLOOD LEFT ARM  Result Value Ref Range Status   Specimen Description BLOOD LEFT ARM  Final   Special Requests   Final    Blood Culture adequate volume BOTTLES DRAWN AEROBIC AND ANAEROBIC Performed at [facility], [address] [place], [place] [address]    Culture PENDING  Incomplete   Report Status PENDING  Incomplete     Radiological Exams on Admission: DG Tibia/Fibula Left  Result Date: 08/31/2021 CLINICAL DATA:  Anterior wound  EXAM: LEFT TIBIA AND FIBULA - 2 VIEW COMPARISON:  None. FINDINGS: Left total knee prosthesis is well seated without periprosthetic fracture or lucency. Well corticated ossific fragment  adjacent to the tip of the lateral malleolus likely sequelae of remote fracture. Diffuse soft tissue edema. No osseous erosions identified to indicate osteomyelitis. IMPRESSION: Diffuse soft tissue edema of the left lower leg without underlying acute osseous abnormality. Electronically Signed   By: Miachel Roux M.D.   On: 08/31/2021 12:57   DG Tibia/Fibula Right  Result Date: 08/31/2021 CLINICAL DATA:  Anterior leg wound. EXAM: RIGHT TIBIA AND FIBULA - 2 VIEW COMPARISON:  None. FINDINGS: Right knee replacement in satisfactory position alignment. No loosening or complication. Negative for fracture of the tibia or fibula. No bone lesion or osteomyelitis. Degenerative changes present in the ankle joint with mild joint space narrowing spurring. Diffuse soft tissue swelling around the ankle. IMPRESSION: Satisfactory right knee replacement Mild ankle degenerative change. Diffuse soft tissue swelling around the ankle No acute skeletal finding. Electronically Signed   By: Franchot Gallo M.D.   On: 08/31/2021 12:56    EKG: Independently reviewed. Atrial fib, nonspecific ST change    Time spent:60 minutes Code Status:   FULL Family Communication:  Daughter updated at bedside 10/21 Disposition Plan: expect 2 day hospitalization Consults called: none DVT Prophylaxis: Xarelto--on hold temporarily due to bleeding  Orson Eva, DO  [facility] Pager [phone]  If 7PM-7AM, please contact night-coverage [email or web address] Password [email or web address] 08/31/2021, 3:29 PM

## 2021-08-31 NOTE — Progress Notes (Signed)
Pharmacy Antibiotic Note  Evan Warner is a 70 y.o. male admitted on 08/31/2021 with  wound infection .  Pharmacy has been consulted for Vancomycin and Cefepime dosing.  Plan: Vancomycin 2000 mg IV x 1 dose. Vancomycin 1250 mg IV every 12 hours. Cefepime 2000 mg IV every 8 hours. Monitor labs, c/s, and vanco level as indicated.  Height: 6\' 2"  (188 cm) Weight: (!) 169.6 kg (374 lb) IBW/kg (Calculated) : 82.2  Temp (24hrs), Avg:98.7 F (37.1 C), Min:98.5 F (36.9 C), Max:98.9 F (37.2 C)  Recent Labs  Lab 08/31/21 1130  WBC 15.7*  CREATININE 1.13  LATICACIDVEN 1.5    Estimated Creatinine Clearance: 100.8 mL/min (by C-G formula based on SCr of 1.13 mg/dL).    Allergies  Allergen Reactions   No Known Allergies     Antimicrobials this admission: Vanco 10/21 >> Cefepime 10/21 >> Flagyl 10/21  Microbiology results: 10/21 BCx: pending   Thank you for allowing pharmacy to be a part of this patient's care.  11/21 08/31/2021 1:15 PM

## 2021-08-31 NOTE — ED Provider Notes (Signed)
[facility] EMERGENCY DEPARTMENT Provider Note   CSN: [id number] Arrival date & time: 08/31/21  3545     History Chief Complaint  Patient presents with   Leg Pain    UZOMA THWING is a 70 y.o. male.   Leg Pain Associated symptoms: no back pain, no fatigue and no fever   Patient presents for redness, pain, and swelling to his distal right leg as well as a recent wound to his left leg but has had persistent bleeding.  Patient's left leg also sustained an injury 1 week ago.  The pain, redness, and swelling have taken place over the last 24 hours.  He has had chills.  He denies any known fevers.  He denies any previous diagnosis of diabetes or PAD.  He does have atrial fibrillation and is on blood thinners.  Yesterday, he struck his anterior left leg on a piece of wood and sustained a laceration.  He was applying direct pressure to his wound and bandaged it.  Today, he found that the wound was continuing to bleed.  He denies any reinjury to it today.  He does feel that bleeding worsens when he is upright and standing.    Past Medical History:  Diagnosis Date   A-fib [facility])    chronic afib history per 02/20/16 [facility] cardioloy note   Arthritis    Coronary atherosclerosis of native coronary artery    DES ramus 2005, LVEF 55%.Takes Pradaxa daily   Enlarged prostate    takes Flomax nightly   Essential hypertension, benign    takes Atenolol,Lisinipril-HCTZ,and Procardia daily   GERD (gastroesophageal reflux disease)    takes Pantoprazole daily   Gout    takes Allopurinol daily   History of kidney stones    Hyperlipidemia    takes Simvastatin daily   Joint pain    Joint swelling    MI (myocardial infarction) (HCC)    [facility] 2005   Mood disorder [facility])    takes Fluoxetine daily   Myasthenia gravis (HCC)    Nocturia    OSA (obstructive sleep apnea)    On CPAP   Peripheral edema    occasionally   Pneumonia    history of    Urinary frequency     Patient  Active Problem List   Diagnosis Date Noted   Sepsis due to undetermined organism (HCC) 08/31/2021   Cellulitis and abscess of right leg 08/31/2021   Unspecified atrial fibrillation (HCC) 08/31/2021   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   OSA (obstructive sleep apnea) 01/23/2012   Essential hypertension, benign 01/03/2010   CORONARY ATHEROSCLEROSIS NATIVE CORONARY ARTERY 01/03/2010   HYPERLIPIDEMIA-MIXED 05/08/2009   OBESITY-MORBID (>100') 05/08/2009    Past Surgical History:  Procedure Laterality Date   ANKLE ARTHROSCOPY     Left   BACK SURGERY     CARDIAC CATHETERIZATION  2005   CORONARY ANGIOPLASTY     1 stent   skin tags removed     TOTAL KNEE ARTHROPLASTY     Right   TOTAL KNEE ARTHROPLASTY Left 07/31/2016   Procedure: LEFT TOTAL KNEE ARTHROPLASTY;  Surgeon: Eldred Manges, MD;  Location: [facility] OR;  Service: Orthopedics;  Laterality: Left;       History reviewed. No pertinent family history.  Social History   Tobacco Use   Smoking status: Former    Types: Cigarettes   Smokeless tobacco: Never   Tobacco comments:    quit smoking in 2005  Substance Use Topics   Alcohol use:  Yes    Comment: occasionally beer   Drug use: No    Home Medications Prior to Admission medications   Medication Sig Start Date End Date Taking? Authorizing Provider  allopurinol (ZYLOPRIM) 300 MG tablet Take 150 mg by mouth daily.   Yes [provider]  aspirin 81 MG chewable tablet Chew 81 mg by mouth daily. 10/31/15  Yes [provider]  atenolol (TENORMIN) 50 MG tablet TAKE 1 TABLET BY MOUTH EVERY DAY Patient taking differently: Take 25 mg by mouth daily. 02/25/12  Yes Rothbart, Gerrit Friends, MD  calcium carbonate (TUMS - DOSED IN MG ELEMENTAL CALCIUM) 500 MG chewable tablet Chew 1 tablet by mouth daily.   Yes [provider]  Coenzyme Q-10 100 MG capsule Take 100 mg by mouth daily.     Yes [provider]  diclofenac Sodium (VOLTAREN) 1 % GEL Apply 2 g topically daily as  needed (pain). 04/19/21  Yes [provider]  FLUoxetine (PROZAC) 20 MG capsule Take 20 mg by mouth daily.   Yes [provider]  lisinopril-hydrochlorothiazide (PRINZIDE,ZESTORETIC) 20-12.5 MG tablet Take 2 tablets by mouth daily.   Yes [provider]  Menthol-Methyl Salicylate (THERA-GESIC) 0.5-15 % CREA APPLY SMALL AMOUNT TO AFFECTED AREA TWICE A DAY AS NEEDED TO LEFT SHOULDER FOR PAIN 04/19/21  Yes [provider]  NIFEdipine (PROCARDIA-XL/ADALAT CC) 30 MG 24 hr tablet Take 30 mg by mouth daily.   Yes [provider]  nitroGLYCERIN (NITROSTAT) 0.4 MG SL tablet Place 0.4 mg under the tongue as needed.     Yes [provider]  NON FORMULARY c-papa  And O2   Yes [provider]  predniSONE (DELTASONE) 20 MG tablet Take 30 mg by mouth daily. 05/29/21  Yes [provider]  pyridostigmine (MESTINON) 60 MG tablet Take 30 mg by mouth 3 (three) times daily. 08/07/21  Yes [provider]  rivaroxaban (XARELTO) 20 MG TABS tablet Take 20 mg by mouth daily. 04/13/20  Yes [provider]  simvastatin (ZOCOR) 80 MG tablet Take 80 mg by mouth daily.   Yes [provider]  Tamsulosin HCl (FLOMAX) 0.4 MG CAPS Take 0.8 mg by mouth at bedtime.   Yes [provider]  dabigatran (PRADAXA) 150 MG CAPS capsule Take 150 mg by mouth 2 (two) times daily. Patient not taking: Reported on 08/31/2021    [provider]  docusate sodium (COLACE) 100 MG capsule Take 1 capsule (100 mg total) by mouth 2 (two) times daily. Patient not taking: No sig reported 08/02/16   Naida Sleight, PA-C  fish oil-omega-3 fatty acids 1000 MG capsule Take 1 capsule by mouth daily.   Patient not taking: Reported on 08/31/2021    [provider]  methocarbamol (ROBAXIN) 500 MG tablet Take 1 tablet (500 mg total) by mouth every 6 (six) hours as needed for muscle spasms. Patient not taking: No sig reported 08/02/16   Naida Sleight,  PA-C  pantoprazole (PROTONIX) 40 MG tablet Take 40 mg by mouth daily. Patient not taking: Reported on 09/01/2021    [provider]    Allergies    No known allergies  Review of Systems   Review of Systems  Constitutional:  Positive for chills. Negative for diaphoresis, fatigue and fever.  HENT:  Negative for congestion, ear pain and sore throat.   Eyes:  Negative for pain and visual disturbance.  Respiratory:  Negative for cough and shortness of breath.   Cardiovascular:  Negative for  chest pain and palpitations.  Gastrointestinal:  Negative for abdominal pain, nausea and vomiting.  Genitourinary:  Negative for dysuria and hematuria.  Musculoskeletal:  Negative for arthralgias and back pain.  Skin:  Positive for color change and wound. Negative for rash.  Allergic/Immunologic: Negative for immunocompromised state.  Neurological:  Negative for dizziness, seizures, syncope, weakness, light-headedness, numbness and headaches.  Hematological:  Bruises/bleeds easily.  Psychiatric/Behavioral:  Negative for confusion and decreased concentration.   All other systems reviewed and are negative.  Physical Exam Updated Vital Signs BP 101/81 (BP Location: Left Arm)   Pulse 90   Temp 98.3 F (36.8 C) (Oral)   Resp 18   Ht 6\' 2"  (1.88 m)   Wt (!) 169.6 kg   SpO2 92%   BMI 48.02 kg/m   Physical Exam Vitals and nursing note reviewed.  Constitutional:      General: He is not in acute distress.    Appearance: Normal appearance. He is well-developed. He is not ill-appearing, toxic-appearing or diaphoretic.  HENT:     Head: Normocephalic and atraumatic.     Right Ear: External ear normal.     Left Ear: External ear normal.     Nose: Nose normal.  Eyes:     General: No scleral icterus.    Extraocular Movements: Extraocular movements intact.     Conjunctiva/sclera: Conjunctivae normal.  Cardiovascular:     Rate and Rhythm: Normal rate and regular rhythm.     Heart sounds: No  murmur heard. Pulmonary:     Effort: Pulmonary effort is normal. No respiratory distress.     Breath sounds: Normal breath sounds.  Abdominal:     Palpations: Abdomen is soft.     Tenderness: There is no abdominal tenderness.  Musculoskeletal:        General: Swelling, tenderness and signs of injury present.     Cervical back: Normal range of motion and neck supple. No rigidity.  Skin:    General: Skin is warm and dry.     Findings: Erythema present.  Neurological:     General: No focal deficit present.     Mental Status: He is alert and oriented to person, place, and time.     Cranial Nerves: No cranial nerve deficit.     Sensory: No sensory deficit.     Motor: No weakness.  Psychiatric:        Mood and Affect: Mood normal.        Behavior: Behavior normal.        Thought Content: Thought content normal.        Judgment: Judgment normal.     ED Results / Procedures / Treatments   Labs (all labs ordered are listed, but only abnormal results are displayed) Labs Reviewed  COMPREHENSIVE METABOLIC PANEL - Abnormal; Notable for the following components:      Result Value   Potassium 2.9 (*)    Glucose, Bld 114 (*)    Calcium 8.8 (*)    Total Bilirubin 1.6 (*)    All other components within normal limits  CBC WITH DIFFERENTIAL/PLATELET - Abnormal; Notable for the following components:   WBC 15.7 (*)    Neutro Abs 13.1 (*)    Abs Immature Granulocytes 0.08 (*)    All other components within normal limits  PROTIME-INR - Abnormal; Notable for the following components:   Prothrombin Time 16.6 (*)    INR 1.3 (*)    All other components within normal limits  C-REACTIVE PROTEIN -  Abnormal; Notable for the following components:   CRP 7.3 (*)    All other components within normal limits  C-REACTIVE PROTEIN - Abnormal; Notable for the following components:   CRP 21.8 (*)    All other components within normal limits  BASIC METABOLIC PANEL - Abnormal; Notable for the following  components:   Glucose, Bld 177 (*)    All other components within normal limits  CBC - Abnormal; Notable for the following components:   WBC 17.7 (*)    All other components within normal limits  RESP PANEL BY RT-PCR (FLU A&B, COVID) ARPGX2  CULTURE, BLOOD (ROUTINE X 2)  CULTURE, BLOOD (ROUTINE X 2)  LACTIC ACID, PLASMA  LACTIC ACID, PLASMA  SEDIMENTATION RATE  PROCALCITONIN  URIC ACID  HIV ANTIBODY (ROUTINE TESTING W REFLEX)  HEMOGLOBIN A1C  CBC  BASIC METABOLIC PANEL  MAGNESIUM  I-STAT CHEM 8, ED    EKG EKG Interpretation  Date/Time:  Friday August 31 2021 11:46:11 EDT Ventricular Rate:  87 PR Interval:    QRS Duration: 102 QT Interval:  376 QTC Calculation: 453 R Axis:   -8 Text Interpretation: Atrial fibrillation Low voltage, extremity leads Minimal ST depression, lateral leads Confirmed by Gloris Manchester [phone]) on 08/31/2021 12:08:23 PM  Radiology DG Tibia/Fibula Left  Result Date: 08/31/2021 CLINICAL DATA:  Anterior wound EXAM: LEFT TIBIA AND FIBULA - 2 VIEW COMPARISON:  None. FINDINGS: Left total knee prosthesis is well seated without periprosthetic fracture or lucency. Well corticated ossific fragment adjacent to the tip of the lateral malleolus likely sequelae of remote fracture. Diffuse soft tissue edema. No osseous erosions identified to indicate osteomyelitis. IMPRESSION: Diffuse soft tissue edema of the left lower leg without underlying acute osseous abnormality. Electronically Signed   By: Acquanetta Belling M.D.   On: 08/31/2021 12:57   DG Tibia/Fibula Right  Result Date: 08/31/2021 CLINICAL DATA:  Anterior leg wound. EXAM: RIGHT TIBIA AND FIBULA - 2 VIEW COMPARISON:  None. FINDINGS: Right knee replacement in satisfactory position alignment. No loosening or complication. Negative for fracture of the tibia or fibula. No bone lesion or osteomyelitis. Degenerative changes present in the ankle joint with mild joint space narrowing spurring. Diffuse soft tissue swelling  around the ankle. IMPRESSION: Satisfactory right knee replacement Mild ankle degenerative change. Diffuse soft tissue swelling around the ankle No acute skeletal finding. Electronically Signed   By: Marlan Palau M.D.   On: 08/31/2021 12:56   MR TIBIA FIBULA RIGHT WO CONTRAST  Result Date: 08/31/2021 CLINICAL DATA:  Redness and inflammation in the right lower leg. Soft tissue infection suspected. EXAM: MRI OF LOWER RIGHT EXTREMITY WITHOUT CONTRAST TECHNIQUE: Multiplanar, multisequence MR imaging of the right lower leg was performed. No intravenous contrast was administered. COMPARISON:  Radiographs 08/31/2021. FINDINGS: Bones/Joint/Cartilage Both lower legs are included on the coronal images. Patient is status post bilateral total knee arthroplasty. The right tibia and fibula appear normal, without evidence of acute fracture, dislocation or osteomyelitis. There is no large knee or ankle joint effusion. Ligaments Status post right total knee arthroplasty. Ankle findings dictated separately. Muscles and Tendons The right lower leg muscles appear unremarkable. The visualized extensor mechanism at the right knee appears intact. The visualized ankle tendons appear intact. Soft tissues There is generalized subcutaneous edema within both lower legs, worse on the right. No focal fluid collections are identified on noncontrast imaging. There are asymmetric varicosities posteriorly at the right knee. IMPRESSION: 1. Nonspecific generalized subcutaneous edema within both lower legs, right greater than left. This  could represent cellulitis, venous insufficiency, lymphedema or heart failure. No focal fluid collection identified. 2. No evidence of deep soft tissue infection, osteomyelitis or significant joint effusion. 3. Ankle and foot findings dictated separately. Electronically Signed   By: Carey Bullocks M.D.   On: 08/31/2021 17:36   MR FOOT RIGHT WO CONTRAST  Result Date: 08/31/2021 CLINICAL DATA:  Redness and  inflammation in the right lower leg. Soft tissue infection suspected. History of diabetes. EXAM: MRI OF THE RIGHT FOREFOOT WITHOUT CONTRAST TECHNIQUE: Multiplanar, multisequence MR imaging of the right forefoot was performed. No intravenous contrast was administered. COMPARISON:  None. FINDINGS: Despite efforts by the technologist and patient, mild motion artifact is present on today's exam and could not be eliminated. This reduces exam sensitivity and specificity. Bones/Joint/Cartilage No evidence of acute fracture, dislocation or bone destruction. There are midfoot degenerative changes with scattered subchondral cysts, largest in the middle cuneiform bone. The alignment is normal at the Lisfranc joint. There are moderate degenerative changes at the 1st metatarsophalangeal joint, associated with a hallux valgus deformity. No significant joint effusions. Ligaments The Lisfranc ligament is intact. The collateral ligaments of the metatarsophalangeal joints appear intact. Muscles and Tendons The forefoot muscles and tendons appear unremarkable. No significant tenosynovitis. Soft tissues Generalized subcutaneous edema, greatest in the dorsal aspect of the foot. No focal fluid collection or foreign body identified. IMPRESSION: 1. Nonspecific dorsal subcutaneous edema as can be seen with cellulitis, venous insufficiency, heart failure or lymphedema. No focal fluid collection identified. 2. No evidence of focal fluid collection, deep soft tissue infection or osteomyelitis. 3. Midfoot and 1st MTP degenerative changes. Electronically Signed   By: Carey Bullocks M.D.   On: 08/31/2021 17:40   MR ANKLE RIGHT WO CONTRAST  Result Date: 08/31/2021 CLINICAL DATA:  Redness and inflammation in the right lower leg. Soft tissue infection suspected. History of diabetes. EXAM: MRI OF THE RIGHT ANKLE WITHOUT CONTRAST TECHNIQUE: Multiplanar, multisequence MR imaging of the ankle was performed. No intravenous contrast was  administered. COMPARISON:  Radiographs 08/31/2021 FINDINGS: TENDONS Peroneal: Intact and normally positioned. Posteromedial: Mild posterior tibialis tendinosis with probable mild longitudinal split tearing. The additional medial flexor tendons appear intact. No significant tendon sheath fluid. Anterior: Intact and normally positioned. Achilles: Intact. Plantar Fascia: Mild thickening of the central cord of the plantar fascia without tear. Small adjacent plantar calcaneal spur. LIGAMENTS Lateral: The anterior and posterior talofibular and calcaneofibular ligaments are intact.The inferior tibiofibular ligaments appear intact. Medial: The deltoid and visualized portions of the spring ligament appear intact. CARTILAGE AND BONES Ankle Joint: No significant ankle joint effusion. There are mild tibiotalar degenerative changes with prominent subchondral cysts laterally in the tibial plafond. Subtalar Joints/Sinus Tarsi: Mild subtalar degenerative changes. There is mild edema in the tarsal sinus. No focal fluid collection. Bones: No evidence of acute fracture, dislocation or bone destruction. Other: Generalized subcutaneous edema surrounding the ankle without focal fluid collection. IMPRESSION: 1. Generalized subcutaneous edema surrounding the ankle, as can be seen with cellulitis, venous insufficiency, heart failure or lymphedema. 2. No evidence of osteomyelitis, septic joint or abscess. 3. Mild tibiotalar and hindfoot degenerative changes. 4. Foot and lower leg findings dictated separately. Electronically Signed   By: Carey Bullocks M.D.   On: 08/31/2021 17:44   US Venous Img Lower Bilateral (DVT)  Result Date: 09/01/2021 CLINICAL DATA:  70 year old male with right greater than left lower extremity edema and pain for 2 weeks. EXAM: BILATERAL LOWER EXTREMITY VENOUS DOPPLER ULTRASOUND TECHNIQUE: Gray-scale sonography with compression, as well  as color and duplex ultrasound, were performed to evaluate the deep venous  system(s) from the level of the common femoral vein through the popliteal and proximal calf veins. COMPARISON:  None. FINDINGS: VENOUS Normal compressibility of the bilateral common femoral, superficial femoral, and popliteal veins, as well as the visualized calf veins. Visualized portions of bilateral profunda femoral vein and great saphenous vein unremarkable. No filling defects to suggest DVT on grayscale or color Doppler imaging. Doppler waveforms show normal direction of venous flow, normal respiratory plasticity and response to augmentation. Limited views of the contralateral common femoral vein are unremarkable. OTHER Right calf edema (image 31). Limitations: none IMPRESSION: No evidence of bilateral lower extremity deep venous thrombosis. Electronically Signed   By: Odessa Fleming M.D.   On: 09/01/2021 10:45   ECHOCARDIOGRAM COMPLETE  Result Date: 09/01/2021    ECHOCARDIOGRAM REPORT   Patient Name:   LAMBERTO DINAPOLI Date of Exam: 09/01/2021 Medical Rec #:  [id number]         Height:       74.0 in Accession #:    [id number]        Weight:       374.0 lb Date of Birth:  1950-11-28          BSA:          2.836 m Patient Age:    70 years          BP:           117/65 mmHg Patient Gender: M                 HR:           68 bpm. Exam Location:  Inpatient Procedure: 2D Echo, Cardiac Doppler, Color Doppler and Intracardiac            Opacification Agent Indications:    Dyspnea  History:        Patient has no prior history of Echocardiogram examinations.                 Arrythmias:Atrial Fibrillation; Risk Factors:Hypertension and                 Dyslipidemia.  Sonographer:    Ross Ludwig RDCS (AE) Referring Phys: [phone] DAVID TAT  Sonographer Comments: Technically difficult study due to poor echo windows and patient is morbidly obese. Image acquisition challenging due to patient body habitus. IMPRESSIONS  1. Left ventricular ejection fraction, by estimation, is 60 to 65%. The left ventricle has normal function. The left  ventricle has no regional wall motion abnormalities. There is moderate concentric left ventricular hypertrophy. Left ventricular diastolic function could not be evaluated.  2. Right ventricular systolic function is normal. The right ventricular size is moderately enlarged. Tricuspid regurgitation signal is inadequate for assessing PA pressure.  3. Left atrial size was severely dilated.  4. Right atrial size was severely dilated.  5. The mitral valve is grossly normal. Trivial mitral valve regurgitation. No evidence of mitral stenosis.  6. The aortic valve is grossly normal. Aortic valve regurgitation is not visualized. No aortic stenosis is present.  7. The inferior vena cava is dilated in size with >50% respiratory variability, suggesting right atrial pressure of 8 mmHg. Comparison(s): No prior Echocardiogram. Conclusion(s)/Recommendation(s): Technically challenging study, even with use of echo contrast. Normal LVEF without significant focal wall motion abnormalities. RV funtion appears normal, but RV size moderately dilated. Cannot estimate RVSP due to lack of TR jet. Severe biatrial enlargement. FINDINGS  Left Ventricle: Left ventricular ejection fraction, by estimation, is 60 to 65%. The left ventricle has normal function. The left ventricle has no regional wall motion abnormalities. Definity contrast agent was given IV to delineate the left ventricular  endocardial borders. The left ventricular internal cavity size was normal in size. There is moderate concentric left ventricular hypertrophy. Left ventricular diastolic function could not be evaluated due to atrial fibrillation. Left ventricular diastolic function could not be evaluated. Right Ventricle: The right ventricular size is moderately enlarged. Right vetricular wall thickness was not well visualized. Right ventricular systolic function is normal. Tricuspid regurgitation signal is inadequate for assessing PA pressure. Left Atrium: Left atrial size was  severely dilated. Right Atrium: Right atrial size was severely dilated. Pericardium: Trivial pericardial effusion is present. Presence of pericardial fat pad. Mitral Valve: The mitral valve is grossly normal. Trivial mitral valve regurgitation. No evidence of mitral valve stenosis. Tricuspid Valve: The tricuspid valve is grossly normal. Tricuspid valve regurgitation is trivial. No evidence of tricuspid stenosis. Aortic Valve: The aortic valve is grossly normal. Aortic valve regurgitation is not visualized. No aortic stenosis is present. Aortic valve mean gradient measures 3.0 mmHg. Aortic valve peak gradient measures 4.9 mmHg. Aortic valve area, by VTI measures 3.59 cm. Pulmonic Valve: The pulmonic valve was not well visualized. Pulmonic valve regurgitation is not visualized. Aorta: The ascending aorta was not well visualized, the aortic arch was not well visualized and the aortic root is normal in size and structure. Venous: The inferior vena cava is dilated in size with greater than 50% respiratory variability, suggesting right atrial pressure of 8 mmHg. IAS/Shunts: The interatrial septum was not well visualized.  LEFT VENTRICLE PLAX 2D LVIDd:         4.80 cm   Diastology LVIDs:         3.00 cm   LV e' medial:    10.60 cm/s LV PW:         1.80 cm   LV E/e' medial:  10.2 LV IVS:        1.60 cm   LV e' lateral:   15.90 cm/s LVOT diam:     2.30 cm   LV E/e' lateral: 6.8 LV SV:         79 LV SV Index:   28 LVOT Area:     4.15 cm  RIGHT VENTRICLE             IVC RV Basal diam:  5.20 cm     IVC diam: 2.70 cm RV Mid diam:    4.60 cm RV S prime:     10.80 cm/s TAPSE (M-mode): 1.9 cm LEFT ATRIUM              Index        RIGHT ATRIUM           Index LA diam:        4.90 cm  1.73 cm/m   RA Area:     43.90 cm LA Vol (A2C):   221.0 ml 77.93 ml/m  RA Volume:   190.00 ml 67.00 ml/m LA Vol (A4C):   144.0 ml 50.78 ml/m LA Biplane Vol: 193.0 ml 68.06 ml/m  AORTIC VALVE AV Area (Vmax):    3.52 cm AV Area (Vmean):   3.51  cm AV Area (VTI):     3.59 cm AV Vmax:           111.00 cm/s AV Vmean:  77.800 cm/s AV VTI:            0.221 m AV Peak Grad:      4.9 mmHg AV Mean Grad:      3.0 mmHg LVOT Vmax:         94.00 cm/s LVOT Vmean:        65.700 cm/s LVOT VTI:          0.191 m LVOT/AV VTI ratio: 0.86  AORTA Ao Root diam: 4.10 cm Ao Asc diam:  3.90 cm MITRAL VALVE MV Area (PHT): 4.65 cm     SHUNTS MV Decel Time: 163 msec     Systemic VTI:  0.19 m MV E velocity: 108.00 cm/s  Systemic Diam: 2.30 cm MV A velocity: 43.70 cm/s MV E/A ratio:  2.47 Jodelle Red MD Electronically signed by Jodelle Red MD Signature Date/Time: 09/01/2021/11:55:06 AM    Final     Procedures Procedures   Medications Ordered in ED Medications  lactated ringers infusion ( Intravenous Rate/Dose Change 08/31/21 1720)  ceFEPIme (MAXIPIME) 2 g in sodium chloride 0.9 % 100 mL IVPB (2 g Intravenous New Bag/Given 09/01/21 1955)  vancomycin (VANCOREADY) IVPB 1250 mg/250 mL (1,250 mg Intravenous New Bag/Given 09/01/21 1500)  atenolol (TENORMIN) tablet 12.5 mg (12.5 mg Oral Given 09/01/21 0924)  FLUoxetine (PROZAC) capsule 20 mg (20 mg Oral Given 09/01/21 0924)  pantoprazole (PROTONIX) EC tablet 40 mg (40 mg Oral Given 09/01/21 0924)  tamsulosin (FLOMAX) capsule 0.4 mg (0.4 mg Oral Given 09/01/21 2156)  allopurinol (ZYLOPRIM) tablet 300 mg (300 mg Oral Given 09/01/21 0924)  acetaminophen (TYLENOL) tablet 650 mg (has no administration in time range)    Or  acetaminophen (TYLENOL) suppository 650 mg (has no administration in time range)  ondansetron (ZOFRAN) tablet 4 mg (has no administration in time range)    Or  ondansetron (ZOFRAN) injection 4 mg (has no administration in time range)  HYDROcodone-acetaminophen (NORCO/VICODIN) 5-325 MG per tablet 1 tablet (has no administration in time range)  methylPREDNISolone sodium succinate (SOLU-MEDROL) 40 mg/mL injection 40 mg (40 mg Intravenous Given 09/01/21 0924)  perflutren lipid  microspheres (DEFINITY) IV suspension (3 mLs Intravenous Given 09/01/21 1130)  pyridostigmine (MESTINON) tablet 30 mg (30 mg Oral Given 09/01/21 2156)  ceFEPIme (MAXIPIME) 2 g in sodium chloride 0.9 % 100 mL IVPB (0 g Intravenous Stopped 08/31/21 1253)  metroNIDAZOLE (FLAGYL) IVPB 500 mg (0 mg Intravenous Stopped 08/31/21 1405)  magnesium sulfate IVPB 2 g 50 mL (0 g Intravenous Stopped 08/31/21 1316)  vancomycin (VANCOREADY) IVPB 2000 mg/400 mL (0 mg Intravenous Stopped 08/31/21 1508)  Tdap (BOOSTRIX) injection 0.5 mL (0.5 mLs Intramuscular Given 08/31/21 1231)  HYDROcodone-acetaminophen (NORCO/VICODIN) 5-325 MG per tablet 1-2 tablet (2 tablets Oral Given 08/31/21 1229)  potassium chloride SA (KLOR-CON) CR tablet 40 mEq (40 mEq Oral Given 08/31/21 1229)  furosemide (LASIX) injection 20 mg (20 mg Intravenous Given 09/01/21 1742)    ED Course  I have reviewed the triage vital signs and the nursing notes.  Pertinent labs & imaging results that were available during my care of the patient were reviewed by me and considered in my medical decision making (see chart for details).    MDM Rules/Calculators/A&P                          Patient presents for pain, swelling and redness to his right leg and a new wound to his left leg.  On arrival, he is alert and oriented.  He is afebrile.  RLE shows warmth, erythema, tenderness, and swelling, consistent with cellulitis infection.  This area of cellulitis extends three quarters of the way up to his knee down to his foot.  Patient is accompanied by his wife in the ED.  She states that his leg did not show any evidence of cellulitis yesterday.  Given the severity of his current cellulitis and the short timeline of it worsening, there is concern for a severe soft tissue infection.  Laboratory work-up was initiated.  Broad-spectrum antibiotics were given.  X-ray of the leg did not show any subcutaneous gas or any bony involvement.  Patient to be admitted for  continued IV antibiotics and monitoring.  In regards to the wound on his left leg, it was oozing blood on arrival.  Pressure dressing was placed and, on reassessment, wound is hemostatic.  Given that it occurred approximately 24 hours ago, no closure of wound is indicated.  Wound to heal by secondary intention.  Patient is already on antibiotics for his other leg which will provide good prophylaxis for avoidance of infection on his new wound.  Admitted to hospitalist for ongoing care.  Final Clinical Impression(s) / ED Diagnoses Final diagnoses:  Cellulitis of right lower extremity    Rx / DC Orders ED Discharge Orders     None        Gloris Manchester, MD 09/01/21 2330

## 2021-09-01 ENCOUNTER — Inpatient Hospital Stay (HOSPITAL_COMMUNITY): Payer: No Typology Code available for payment source

## 2021-09-01 DIAGNOSIS — R0609 Other forms of dyspnea: Secondary | ICD-10-CM

## 2021-09-01 LAB — ECHOCARDIOGRAM COMPLETE
AR max vel: 3.52 cm2
AV Area VTI: 3.59 cm2
AV Area mean vel: 3.51 cm2
AV Mean grad: 3 mmHg
AV Peak grad: 4.9 mmHg
Ao pk vel: 1.11 m/s
Area-P 1/2: 4.65 cm2
Height: 74 in
S' Lateral: 3 cm
Weight: 5984 oz

## 2021-09-01 LAB — BASIC METABOLIC PANEL
Anion gap: 11 (ref 5–15)
BUN: 20 mg/dL (ref 8–23)
CO2: 24 mmol/L (ref 22–32)
Calcium: 8.9 mg/dL (ref 8.9–10.3)
Chloride: 103 mmol/L (ref 98–111)
Creatinine, Ser: 1 mg/dL (ref 0.61–1.24)
GFR, Estimated: >60 mL/min (ref >60)
Glucose, Bld: 177 mg/dL — ABNORMAL HIGH (ref 70–99)
Potassium: 3.9 mmol/L (ref 3.5–5.1)
Sodium: 138 mmol/L (ref 135–145)

## 2021-09-01 LAB — CBC
HCT: 43.2 % (ref 39.0–52.0)
Hemoglobin: 13.8 g/dL (ref 13.0–17.0)
MCH: 31 pg (ref 26.0–34.0)
MCHC: 31.9 g/dL (ref 30.0–36.0)
MCV: 97.1 fL (ref 80.0–100.0)
Platelets: 163 10*3/uL (ref 150–400)
RBC: 4.45 MIL/uL (ref 4.22–5.81)
RDW: 14.4 % (ref 11.5–15.5)
WBC: 17.7 10*3/uL — ABNORMAL HIGH (ref 4.0–10.5)
nRBC: 0 % (ref 0.0–0.2)

## 2021-09-01 LAB — HIV ANTIBODY (ROUTINE TESTING W REFLEX): HIV Screen 4th Generation wRfx: NONREACTIVE

## 2021-09-01 LAB — C-REACTIVE PROTEIN: CRP: 21.8 mg/dL — ABNORMAL HIGH (ref <1.0)

## 2021-09-01 IMAGING — US US EXTREM LOW VENOUS
1 series · 14 of 24 positions shown · non-contrast
Comparison: None.

CLINICAL DATA: 70-year-old male with right greater than left lower
extremity edema and pain for 2 weeks.

EXAM:
BILATERAL LOWER EXTREMITY VENOUS DOPPLER ULTRASOUND
TECHNIQUE: Gray-scale sonography with compression, as well as color and duplex
ultrasound, were performed to evaluate the deep venous system(s)
from the level of the common femoral vein through the popliteal and
proximal calf veins.

[Series 1: us venous img lower bilat (dvt) · portal-venous · 14 of 69 slices shown]
[im 1/69]
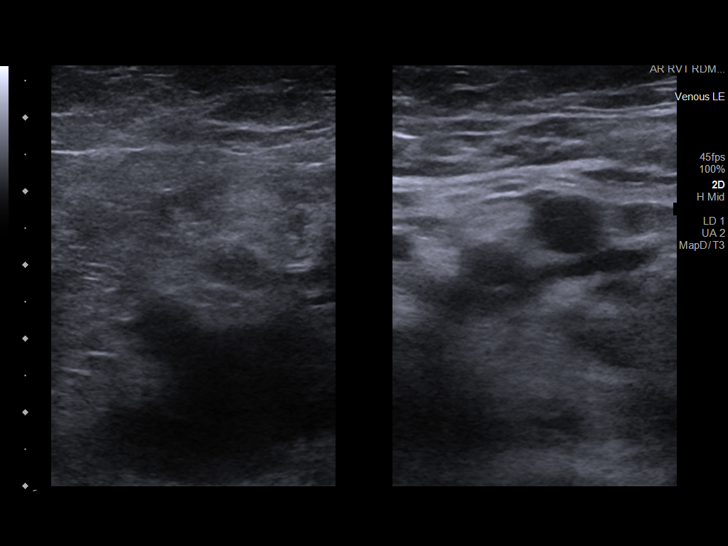
[im 6/69]
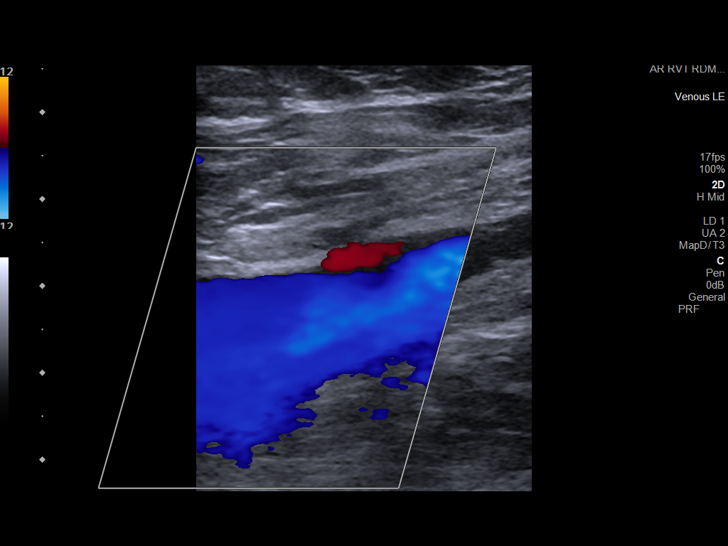
[im 12/69]
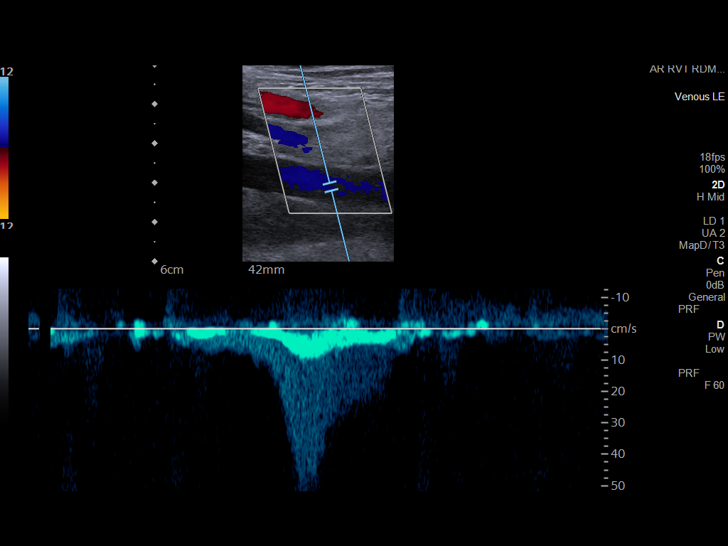
[im 18/69]
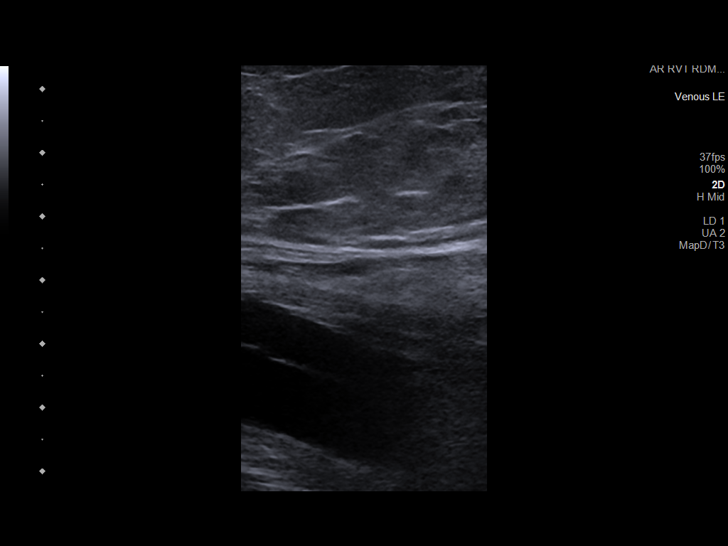
[im 21/69]
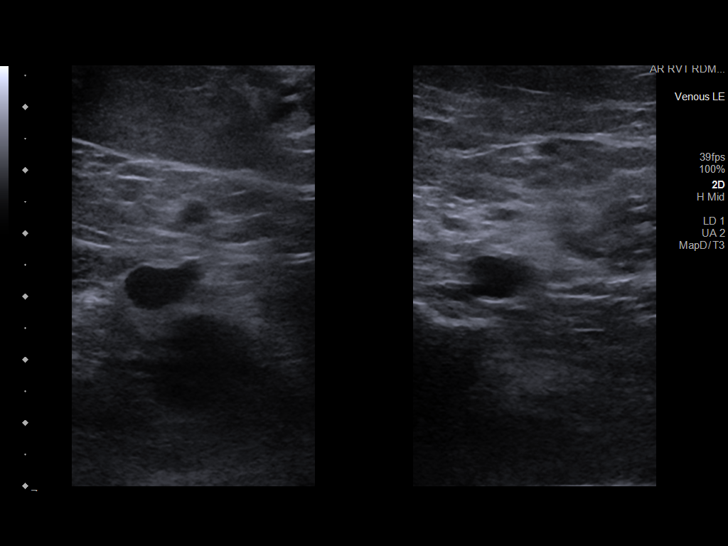
[im 27/69]
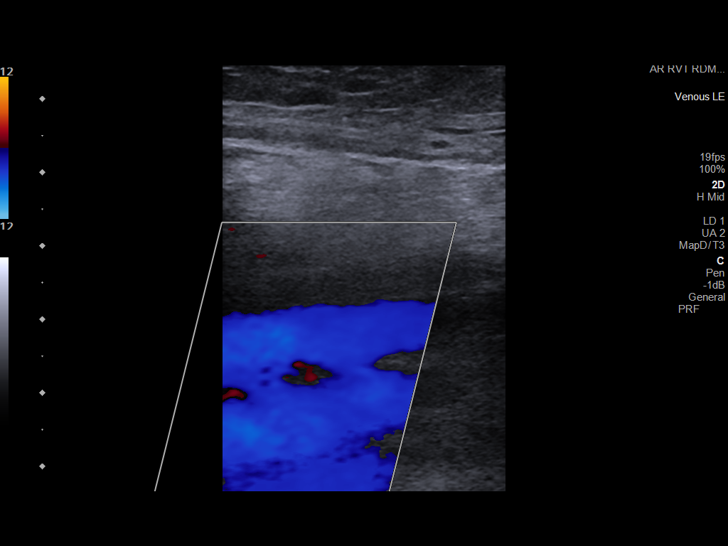
[im 33/69]
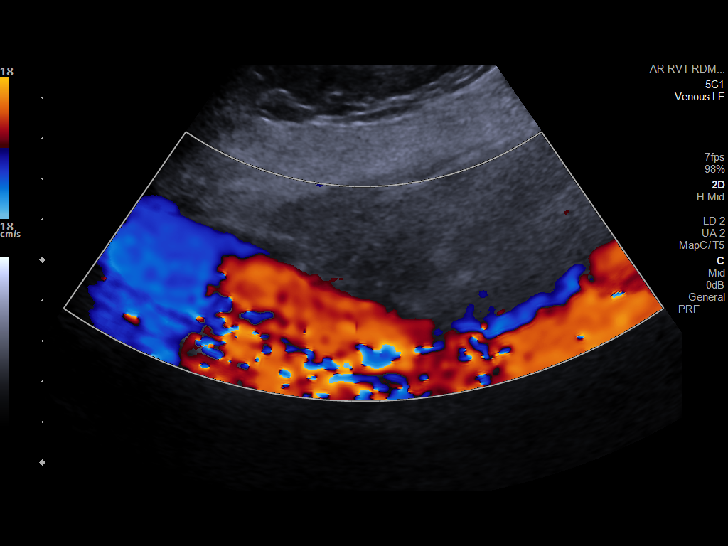
[im 36/69]
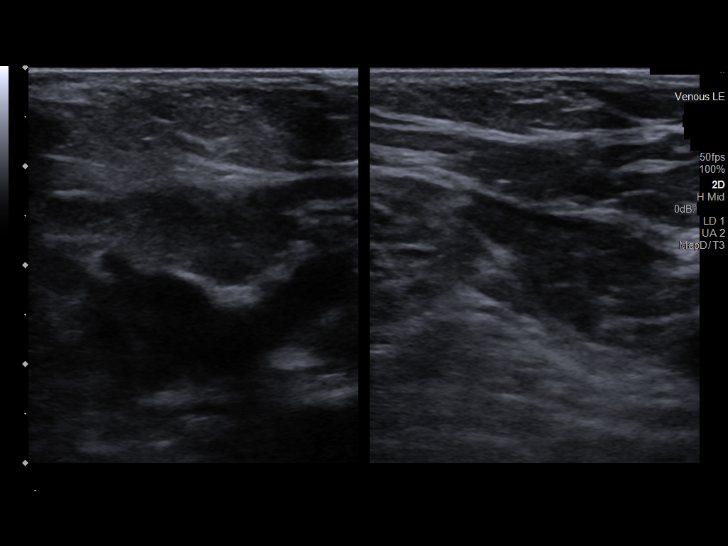
[im 42/69]
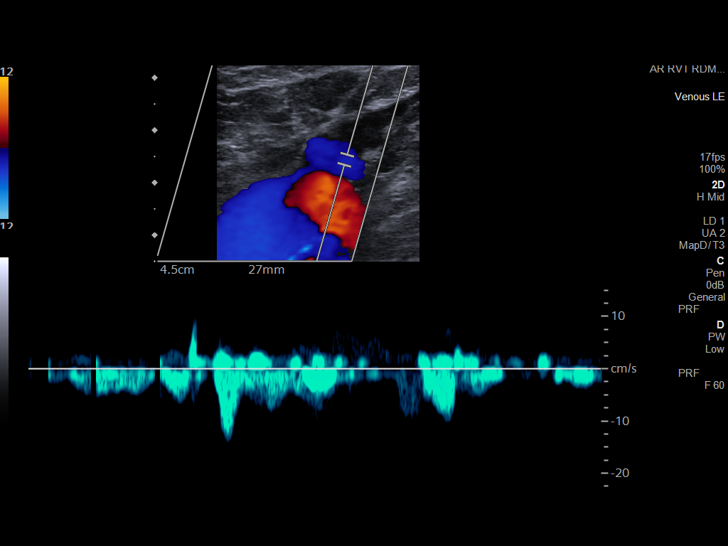
[im 48/69]
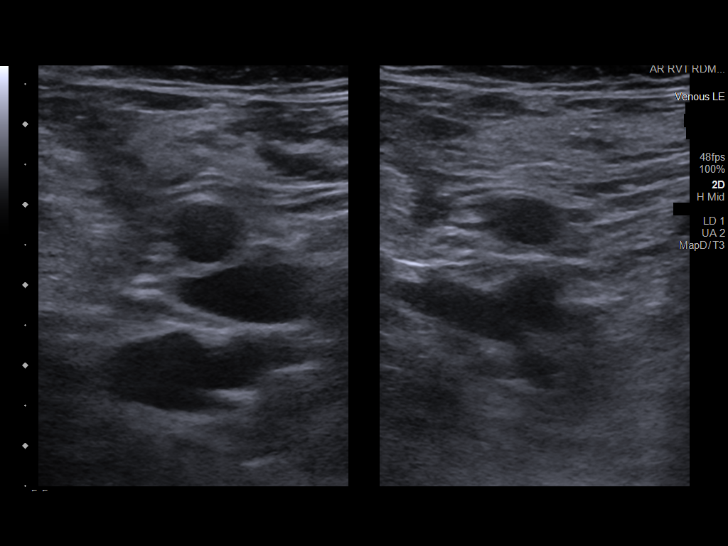
[im 54/69]
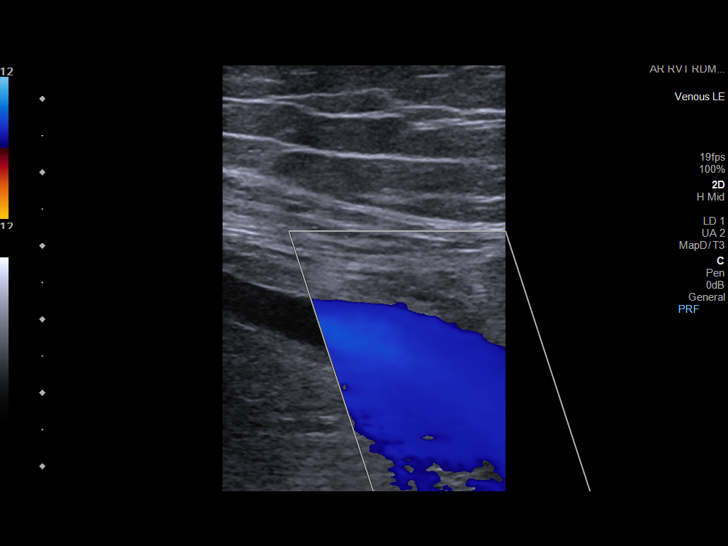
[im 57/69]
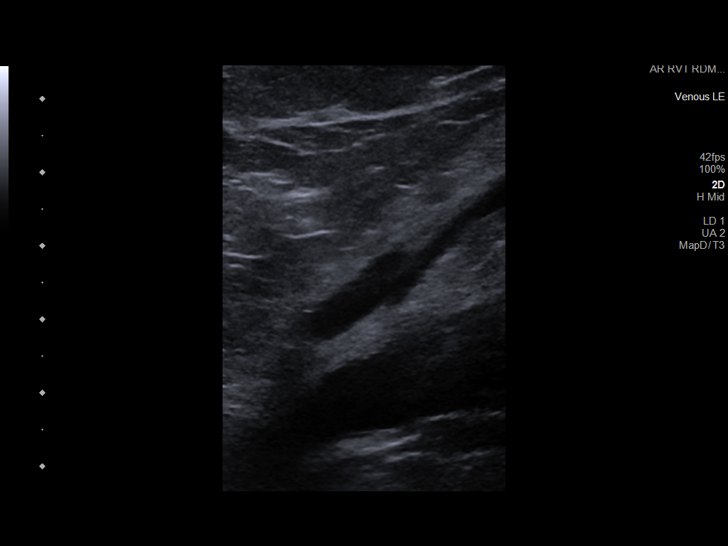
[im 63/69]
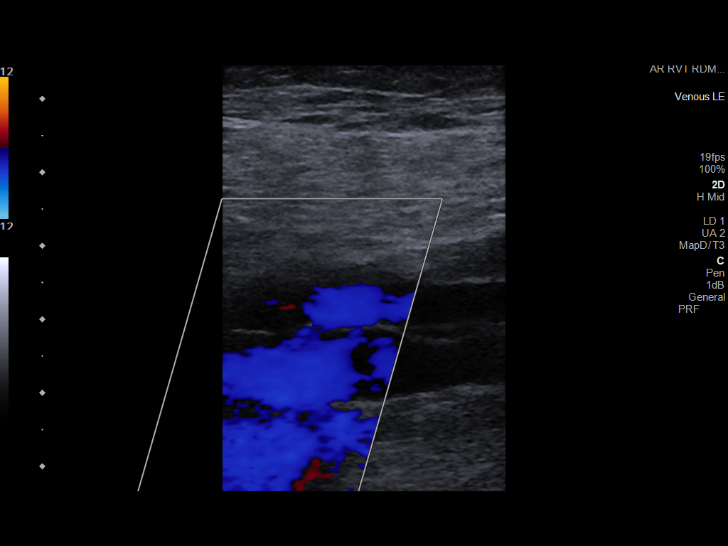
[im 69/69]
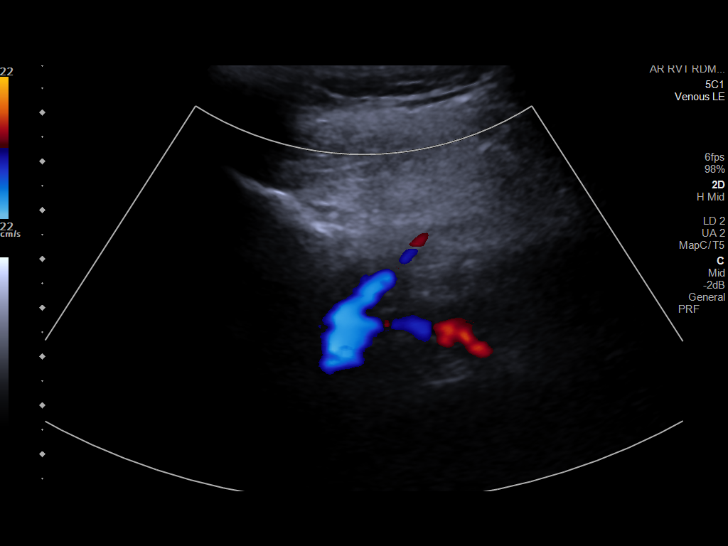

[14 of 24 positions shown; findings below may reference images not displayed]

FINDINGS: VENOUS

Normal compressibility of the bilateral common femoral, superficial
femoral, and popliteal veins, as well as the visualized calf veins.
Visualized portions of bilateral profunda femoral vein and great
saphenous vein unremarkable. No filling defects to suggest DVT on
grayscale or color Doppler imaging. Doppler waveforms show normal
direction of venous flow, normal respiratory plasticity and response
to augmentation.

Limited views of the contralateral common femoral vein are
unremarkable.

OTHER

Right calf edema (image 31).

Limitations: none
IMPRESSION: No evidence of bilateral lower extremity deep venous thrombosis.

## 2021-09-01 MED ORDER — PERFLUTREN LIPID MICROSPHERE
1.0000 mL | INTRAVENOUS | Status: AC | PRN
  Administered 2021-09-01: 3 mL via INTRAVENOUS

## 2021-09-01 MED ORDER — FUROSEMIDE 10 MG/ML IJ SOLN
20.0000 mg | Freq: Once | INTRAMUSCULAR | Status: AC
  Administered 2021-09-01: 20 mg via INTRAVENOUS
  Filled 2021-09-01: qty 2

## 2021-09-01 MED ORDER — PYRIDOSTIGMINE BROMIDE 60 MG PO TABS
30.0000 mg | ORAL_TABLET | Freq: Three times a day (TID) | ORAL | Status: DC
  Administered 2021-09-01 – 2021-09-02 (×2): 30 mg via ORAL
  Filled 2021-09-01 (×2): qty 1

## 2021-09-01 MED ORDER — FUROSEMIDE 10 MG/ML IJ SOLN: 40.0000 mg | Freq: Once | INTRAMUSCULAR | Status: DC

## 2021-09-01 NOTE — Progress Notes (Signed)
PROGRESS NOTE  Evan Warner WCB:[id number] DOB: 1950/11/30 DOA: 08/31/2021 PCP: Pcp, No  Brief History:  70 y.o. male with medical history of impaired glucose tolerance, myasthenia gravis, hypertension, atrial fibrillation, hyperlipidemia, BPH presenting with right leg pain, swelling, and erythema.  The patient states that he accidentally cut his right pretibial area with the edge of a mirror about 2 weeks prior to this admission.  He has noted some clear fluid drainage from the wound but has not noted any pus or blood.  Over this period of time he has noted some increasing swelling as well as some mild redness.  However over the past 24 hours he has noticed a significant increase in his right lower extremity and right foot erythema, pain, and edema.  As result, the patient presented for further evaluation.  He had subjective fevers and chills on the evening of 08/30/2021.  He denies any headache, neck pain, chest pain, shortness breath, cough, hemoptysis, nausea, vomiting, diarrhea, abdominal pain, dysuria, hematuria. Notably, the patient states that he experienced a accidental cut on his left leg on the edge of a table on 08/30/2021 and had difficulty getting it to stop bleeding.  He denies any increasing erythema, redness, or pain in the left lower extremity.  He has not been on any recent antibiotics.  The patient receives all his care at the [place]. In the emergency department, the patient was afebrile hemodynamically stable with oxygen saturation 93-94% on room air.  BMP showed sodium 140, potassium 2.9, serum creatinine 1.13.  AST 17, ALT 21, alk phosphatase 57, total bilirubin 1.6, albumin 3.8.  WBC 15.7, hemoglobin 14.2, platelets 175,000.  Lactic acid 1.5.  EKG showed atrial fibrillation with nonspecific ST changes.  X-rays of the right tib-fib area showed diffuse soft tissue swelling without any osseous abnormalities.  Assessment/Plan:   Sepsis -present on admssion -presented  with leukocytosis and tachypnea -Lactic acid 1.5 -Check PCT--0.12 -Continue empiric vancomycin and cefepime -Follow blood cultures--neg to date   Cellulitis right lower extremity -Check ESR--14 -Check CRP 7.3>>21.8 -MRI right lower extremity--no abscess, no osteomyelitis -[place] legs>>no DVT   Myasthenia gravis -Patient is currently on prednisone 30 mg daily -Start patient on Solu-Medrol 40 mg IV daily for stress dosing -restart pyridostigmine   Impaired glucose tolerance -Review of the record showed that the patient had hemoglobin A1c of 6.1 on 04/02/2021 -Repeat hemoglobin A1c -Anticipate elevated CBGs secondary to steroids -NovoLog sliding scale   Essential hypertension -Holding lisinopril HCTZ and nifedipine secondary to soft blood pressure   Hyperlipidemia -Continue statin   Atrial fibrillation, type unspecified -Holding Xarelto temporarily -Restart atenolol lower dose due to soft BPs   Depression/anxiety -Continue fluoxetine   Gouty arthritis -Continue allopurinol -Check uric acid--5.4   BPH -Continue tamsulosin   Morbid Obesity -BMI 48.02 -lifestyle modification   Hypokalemia -replete -check mag  Leg Edema -lasix 20 mg IV x 1 -09/01/21 Echo--EF 60-65%, mod dilated RV, no WMA   Status is: Inpatient  Remains inpatient appropriate because: severity of illness requiring IV abx, meeting sepsis criteria        Family Communication:   spouse at bedside 09/01/21  Consultants:  none  Code Status:  FULL   DVT Prophylaxis:  Xarelto--on hold   Procedures: As Listed in Progress Note Above  Antibiotics: Vanc 10/21>> Cefepime 10/21>>       Subjective: R leg is feeling better.  Pain is improving.  He is able to bear weight  on it.  Denies f/c, cp, sob, n/v/d, abd pain  Objective: Vitals:   09/01/21 0221 09/01/21 0447 09/01/21 0922 09/01/21 1335  BP: 124/61 139/82 117/65 130/72  Pulse: 74 64 (!) 57 83  Resp: _0 Temp: 98 F (36.7  C) 98.5 F (36.9 C)  98.2 F (36.8 C)  TempSrc:  Oral  Oral  SpO2: 91% 97%  94%  Weight:      Height:        Intake/Output Summary (Last 24 hours) at 09/01/2021 1713 Last data filed at 09/01/2021 1536 Gross per 24 hour  Intake 1513.27 ml  Output --  Net 1513.27 ml   Weight change:  Exam:  General:  Pt is alert, follows commands appropriately, not in acute distress HEENT: No icterus, No thrush, No neck mass, [place]/AT Cardiovascular: RRR, S1/S2, no rubs, no gallops Respiratory: CTA bilaterally, no wheezing, no crackles, no rhonchi Abdomen: Soft/+BS, non tender, non distended, no guarding Extremities:2+LE edema, erythema on right foot to infrapatellar area with blanchable and nonblanchable erythyema   Data Reviewed: I have personally reviewed following labs and imaging studies Basic Metabolic Panel: Recent Labs  Lab 08/31/21 1130 09/01/21 0553  NA 140 138  K 2.9* 3.9  CL 102 103  CO2 30 24  GLUCOSE 114* 177*  BUN 20 20  CREATININE 1.13 1.00  CALCIUM 8.8* 8.9   Liver Function Tests: Recent Labs  Lab 08/31/21 1130  AST 17  ALT 21  ALKPHOS 57  BILITOT 1.6*  PROT 6.7  ALBUMIN 3.8   No results for input(s): LIPASE, AMYLASE in the last 168 hours. No results for input(s): AMMONIA in the last 168 hours. Coagulation Profile: Recent Labs  Lab 08/31/21 1130  INR 1.3*   CBC: Recent Labs  Lab 08/31/21 1130 09/01/21 0553  WBC 15.7* 17.7*  NEUTROABS 13.1*  --   HGB 14.2 13.8  HCT 43.2 43.2  MCV 97.5 97.1  PLT 175 163   Cardiac Enzymes: No results for input(s): CKTOTAL, CKMB, CKMBINDEX, TROPONINI in the last 168 hours. BNP: Invalid input(s): POCBNP CBG: No results for input(s): GLUCAP in the last 168 hours. HbA1C: No results for input(s): HGBA1C in the last 72 hours. Urine analysis:    Component Value Date/Time   COLORURINE YELLOW 07/25/2016 [place] 07/25/2016 1431   LABSPEC 1.018 07/25/2016 1431   PHURINE 6.5 07/25/2016 1431    GLUCOSEU NEGATIVE 07/25/2016 1431   HGBUR MODERATE (A) 07/25/2016 1431   BILIRUBINUR NEGATIVE 07/25/2016 1431   KETONESUR NEGATIVE 07/25/2016 1431   PROTEINUR NEGATIVE 07/25/2016 1431   UROBILINOGEN 0.2 09/10/2010 1331   NITRITE NEGATIVE 07/25/2016 1431   LEUKOCYTESUR NEGATIVE 07/25/2016 1431   Sepsis Labs: _1 (procalcitonin:4,lacticidven:4) ) Recent Results (from the past 240 hour(s))  Blood Culture (routine x 2)     Status: None (Preliminary result)   Collection Time: 08/31/21 11:30 AM   Specimen: Right Antecubital; Blood  Result Value Ref Range Status   Specimen Description   Final    RIGHT ANTECUBITAL BOTTLES DRAWN AEROBIC AND ANAEROBIC   Special Requests   Final    Blood Culture results may not be optimal due to an excessive volume of blood received in culture bottles   Culture   Final    NO GROWTH < 24 HOURS Performed at [facility], [address], [place], [place] [address]    Report Status PENDING  Incomplete  Resp Panel by RT-PCR (Flu A&B, Covid) Nasopharyngeal Swab     Status:  None   Collection Time: 08/31/21 11:54 AM   Specimen: Nasopharyngeal Swab; Nasopharyngeal(NP) swabs in vial transport medium  Result Value Ref Range Status   SARS Coronavirus 2 by RT PCR NEGATIVE NEGATIVE Final    Comment: (NOTE) SARS-CoV-2 target nucleic acids are NOT DETECTED.  The SARS-CoV-2 RNA is generally detectable in upper respiratory specimens during the acute phase of infection. The lowest concentration of SARS-CoV-2 viral copies this assay can detect is 138 copies/mL. A negative result does not preclude SARS-Cov-2 infection and should not be used as the sole basis for treatment or other patient management decisions. A negative result may occur with  improper specimen collection/handling, submission of specimen other than nasopharyngeal swab, presence of viral mutation(s) within the areas targeted by this assay, and inadequate number of viral copies(<138 copies/mL). A  negative result must be combined with clinical observations, patient history, and epidemiological information. The expected result is Negative.  Fact Sheet for Patients:  EntrepreneurPulse.com.au  Fact Sheet for Healthcare Providers:  IncredibleEmployment.be  This test is no t yet approved or cleared by the [place] FDA and  has been authorized for detection and/or diagnosis of SARS-CoV-2 by FDA under an Emergency Use Authorization (EUA). This EUA will remain  in effect (meaning this test can be used) for the duration of the COVID-19 declaration under Section 564(b)(1) of the Act, 21 U.S.C.section 360bbb-3(b)(1), unless the authorization is terminated  or revoked sooner.       Influenza A by PCR NEGATIVE NEGATIVE Final   Influenza B by PCR NEGATIVE NEGATIVE Final    Comment: (NOTE) The Xpert Xpress SARS-CoV-2/FLU/RSV plus assay is intended as an aid in the diagnosis of influenza from Nasopharyngeal swab specimens and should not be used as a sole basis for treatment. Nasal washings and aspirates are unacceptable for Xpert Xpress SARS-CoV-2/FLU/RSV testing.  Fact Sheet for Patients: EntrepreneurPulse.com.au  Fact Sheet for Healthcare Providers: IncredibleEmployment.be  This test is not yet approved or cleared by the [place] FDA and has been authorized for detection and/or diagnosis of SARS-CoV-2 by FDA under an Emergency Use Authorization (EUA). This EUA will remain in effect (meaning this test can be used) for the duration of the COVID-19 declaration under Section 564(b)(1) of the Act, 21 U.S.C. section 360bbb-3(b)(1), unless the authorization is terminated or revoked.  Performed at [facility], [address] [place], [place] [address]   Blood Culture (routine x 2)     Status: None (Preliminary result)   Collection Time: 08/31/21 12:36 PM   Specimen: BLOOD LEFT ARM  Result Value Ref Range  Status   Specimen Description BLOOD LEFT ARM  Final   Special Requests   Final    Blood Culture adequate volume BOTTLES DRAWN AEROBIC AND ANAEROBIC   Culture   Final    NO GROWTH < 24 HOURS Performed at [facility], [address] [place], [place] [address]    Report Status PENDING  Incomplete     Scheduled Meds:  allopurinol  300 mg Oral Daily   atenolol  12.5 mg Oral Daily   FLUoxetine  20 mg Oral Daily   furosemide  20 mg Intravenous Once   methylPREDNISolone (SOLU-MEDROL) injection  40 mg Intravenous Daily   pantoprazole  40 mg Oral Daily   tamsulosin  0.4 mg Oral QHS   Continuous Infusions:  ceFEPime (MAXIPIME) IV 2 g (09/01/21 1327)   vancomycin 1,250 mg (09/01/21 1500)    Procedures/Studies: DG Tibia/Fibula Left  Result Date: 08/31/2021 CLINICAL DATA:  Anterior  wound EXAM: LEFT TIBIA AND FIBULA - 2 VIEW COMPARISON:  None. FINDINGS: Left total knee prosthesis is well seated without periprosthetic fracture or lucency. Well corticated ossific fragment adjacent to the tip of the lateral malleolus likely sequelae of remote fracture. Diffuse soft tissue edema. No osseous erosions identified to indicate osteomyelitis. IMPRESSION: Diffuse soft tissue edema of the left lower leg without underlying acute osseous abnormality. Electronically Signed   By: Miachel Roux M.D.   On: 08/31/2021 12:57   DG Tibia/Fibula Right  Result Date: 08/31/2021 CLINICAL DATA:  Anterior leg wound. EXAM: RIGHT TIBIA AND FIBULA - 2 VIEW COMPARISON:  None. FINDINGS: Right knee replacement in satisfactory position alignment. No loosening or complication. Negative for fracture of the tibia or fibula. No bone lesion or osteomyelitis. Degenerative changes present in the ankle joint with mild joint space narrowing spurring. Diffuse soft tissue swelling around the ankle. IMPRESSION: Satisfactory right knee replacement Mild ankle degenerative change. Diffuse soft tissue swelling around the ankle No acute skeletal  finding. Electronically Signed   By: Franchot Gallo M.D.   On: 08/31/2021 12:56   MR TIBIA FIBULA RIGHT WO CONTRAST  Result Date: 08/31/2021 CLINICAL DATA:  Redness and inflammation in the right lower leg. Soft tissue infection suspected. EXAM: MRI OF LOWER RIGHT EXTREMITY WITHOUT CONTRAST TECHNIQUE: Multiplanar, multisequence MR imaging of the right lower leg was performed. No intravenous contrast was administered. COMPARISON:  Radiographs 08/31/2021. FINDINGS: Bones/Joint/Cartilage Both lower legs are included on the coronal images. Patient is status post bilateral total knee arthroplasty. The right tibia and fibula appear normal, without evidence of acute fracture, dislocation or osteomyelitis. There is no large knee or ankle joint effusion. Ligaments Status post right total knee arthroplasty. Ankle findings dictated separately. Muscles and Tendons The right lower leg muscles appear unremarkable. The visualized extensor mechanism at the right knee appears intact. The visualized ankle tendons appear intact. Soft tissues There is generalized subcutaneous edema within both lower legs, worse on the right. No focal fluid collections are identified on noncontrast imaging. There are asymmetric varicosities posteriorly at the right knee. IMPRESSION: 1. Nonspecific generalized subcutaneous edema within both lower legs, right greater than left. This could represent cellulitis, venous insufficiency, lymphedema or heart failure. No focal fluid collection identified. 2. No evidence of deep soft tissue infection, osteomyelitis or significant joint effusion. 3. Ankle and foot findings dictated separately. Electronically Signed   By: Richardean Sale M.D.   On: 08/31/2021 17:36   MR FOOT RIGHT WO CONTRAST  Result Date: 08/31/2021 CLINICAL DATA:  Redness and inflammation in the right lower leg. Soft tissue infection suspected. History of diabetes. EXAM: MRI OF THE RIGHT FOREFOOT WITHOUT CONTRAST TECHNIQUE: Multiplanar,  multisequence MR imaging of the right forefoot was performed. No intravenous contrast was administered. COMPARISON:  None. FINDINGS: Despite efforts by the technologist and patient, mild motion artifact is present on today's exam and could not be eliminated. This reduces exam sensitivity and specificity. Bones/Joint/Cartilage No evidence of acute fracture, dislocation or bone destruction. There are midfoot degenerative changes with scattered subchondral cysts, largest in the middle cuneiform bone. The alignment is normal at the Lisfranc joint. There are moderate degenerative changes at the 1st metatarsophalangeal joint, associated with a hallux valgus deformity. No significant joint effusions. Ligaments The Lisfranc ligament is intact. The collateral ligaments of the metatarsophalangeal joints appear intact. Muscles and Tendons The forefoot muscles and tendons appear unremarkable. No significant tenosynovitis. Soft tissues Generalized subcutaneous edema, greatest in the dorsal aspect of the foot. No focal  fluid collection or foreign body identified. IMPRESSION: 1. Nonspecific dorsal subcutaneous edema as can be seen with cellulitis, venous insufficiency, heart failure or lymphedema. No focal fluid collection identified. 2. No evidence of focal fluid collection, deep soft tissue infection or osteomyelitis. 3. Midfoot and 1st MTP degenerative changes. Electronically Signed   By: Richardean Sale M.D.   On: 08/31/2021 17:40   MR ANKLE RIGHT WO CONTRAST  Result Date: 08/31/2021 CLINICAL DATA:  Redness and inflammation in the right lower leg. Soft tissue infection suspected. History of diabetes. EXAM: MRI OF THE RIGHT ANKLE WITHOUT CONTRAST TECHNIQUE: Multiplanar, multisequence MR imaging of the ankle was performed. No intravenous contrast was administered. COMPARISON:  Radiographs 08/31/2021 FINDINGS: TENDONS Peroneal: Intact and normally positioned. Posteromedial: Mild posterior tibialis tendinosis with probable  mild longitudinal split tearing. The additional medial flexor tendons appear intact. No significant tendon sheath fluid. Anterior: Intact and normally positioned. Achilles: Intact. Plantar Fascia: Mild thickening of the central cord of the plantar fascia without tear. Small adjacent plantar calcaneal spur. LIGAMENTS Lateral: The anterior and posterior talofibular and calcaneofibular ligaments are intact.The inferior tibiofibular ligaments appear intact. Medial: The deltoid and visualized portions of the spring ligament appear intact. CARTILAGE AND BONES Ankle Joint: No significant ankle joint effusion. There are mild tibiotalar degenerative changes with prominent subchondral cysts laterally in the tibial plafond. Subtalar Joints/Sinus Tarsi: Mild subtalar degenerative changes. There is mild edema in the tarsal sinus. No focal fluid collection. Bones: No evidence of acute fracture, dislocation or bone destruction. Other: Generalized subcutaneous edema surrounding the ankle without focal fluid collection. IMPRESSION: 1. Generalized subcutaneous edema surrounding the ankle, as can be seen with cellulitis, venous insufficiency, heart failure or lymphedema. 2. No evidence of osteomyelitis, septic joint or abscess. 3. Mild tibiotalar and hindfoot degenerative changes. 4. Foot and lower leg findings dictated separately. Electronically Signed   By: Richardean Sale M.D.   On: 08/31/2021 17:44   US Venous Img Lower Bilateral (DVT)  Result Date: 09/01/2021 CLINICAL DATA:  70 year old male with right greater than left lower extremity edema and pain for 2 weeks. EXAM: BILATERAL LOWER EXTREMITY VENOUS DOPPLER ULTRASOUND TECHNIQUE: Gray-scale sonography with compression, as well as color and duplex ultrasound, were performed to evaluate the deep venous system(s) from the level of the common femoral vein through the popliteal and proximal calf veins. COMPARISON:  None. FINDINGS: VENOUS Normal compressibility of the bilateral  common femoral, superficial femoral, and popliteal veins, as well as the visualized calf veins. Visualized portions of bilateral profunda femoral vein and great saphenous vein unremarkable. No filling defects to suggest DVT on grayscale or color Doppler imaging. Doppler waveforms show normal direction of venous flow, normal respiratory plasticity and response to augmentation. Limited views of the contralateral common femoral vein are unremarkable. OTHER Right calf edema (image 31). Limitations: none IMPRESSION: No evidence of bilateral lower extremity deep venous thrombosis. Electronically Signed   By: Genevie Ann M.D.   On: 09/01/2021 10:45   ECHOCARDIOGRAM COMPLETE  Result Date: 09/01/2021    ECHOCARDIOGRAM REPORT   Patient Name:   Evan Warner Date of Exam: 09/01/2021 Medical Rec #:  [id number]         Height:       74.0 in Accession #:    [id number]        Weight:       374.0 lb Date of Birth:  [birth year]          BSA:  2.836 m Patient Age:    61 years          BP:           117/65 mmHg Patient Gender: M                 HR:           68 bpm. Exam Location:  Inpatient Procedure: 2D Echo, Cardiac Doppler, Color Doppler and Intracardiac            Opacification Agent Indications:    Dyspnea  History:        Patient has no prior history of Echocardiogram examinations.                 Arrythmias:Atrial Fibrillation; Risk Factors:Hypertension and                 Dyslipidemia.  Sonographer:    Clayton Lefort RDCS (AE) Referring Phys: [phone] Laira Penninger  Sonographer Comments: Technically difficult study due to poor echo windows and patient is morbidly obese. Image acquisition challenging due to patient body habitus. IMPRESSIONS  1. Left ventricular ejection fraction, by estimation, is 60 to 65%. The left ventricle has normal function. The left ventricle has no regional wall motion abnormalities. There is moderate concentric left ventricular hypertrophy. Left ventricular diastolic function could not be evaluated.  2.  Right ventricular systolic function is normal. The right ventricular size is moderately enlarged. Tricuspid regurgitation signal is inadequate for assessing PA pressure.  3. Left atrial size was severely dilated.  4. Right atrial size was severely dilated.  5. The mitral valve is grossly normal. Trivial mitral valve regurgitation. No evidence of mitral stenosis.  6. The aortic valve is grossly normal. Aortic valve regurgitation is not visualized. No aortic stenosis is present.  7. The inferior vena cava is dilated in size with >50% respiratory variability, suggesting right atrial pressure of 8 mmHg. Comparison(s): No prior Echocardiogram. Conclusion(s)/Recommendation(s): Technically challenging study, even with use of echo contrast. Normal LVEF without significant focal wall motion abnormalities. RV funtion appears normal, but RV size moderately dilated. Cannot estimate RVSP due to lack of TR jet. Severe biatrial enlargement. FINDINGS  Left Ventricle: Left ventricular ejection fraction, by estimation, is 60 to 65%. The left ventricle has normal function. The left ventricle has no regional wall motion abnormalities. Definity contrast agent was given IV to delineate the left ventricular  endocardial borders. The left ventricular internal cavity size was normal in size. There is moderate concentric left ventricular hypertrophy. Left ventricular diastolic function could not be evaluated due to atrial fibrillation. Left ventricular diastolic function could not be evaluated. Right Ventricle: The right ventricular size is moderately enlarged. Right vetricular wall thickness was not well visualized. Right ventricular systolic function is normal. Tricuspid regurgitation signal is inadequate for assessing PA pressure. Left Atrium: Left atrial size was severely dilated. Right Atrium: Right atrial size was severely dilated. Pericardium: Trivial pericardial effusion is present. Presence of pericardial fat pad. Mitral Valve: The  mitral valve is grossly normal. Trivial mitral valve regurgitation. No evidence of mitral valve stenosis. Tricuspid Valve: The tricuspid valve is grossly normal. Tricuspid valve regurgitation is trivial. No evidence of tricuspid stenosis. Aortic Valve: The aortic valve is grossly normal. Aortic valve regurgitation is not visualized. No aortic stenosis is present. Aortic valve mean gradient measures 3.0 mmHg. Aortic valve peak gradient measures 4.9 mmHg. Aortic valve area, by VTI measures 3.59 cm. Pulmonic Valve: The pulmonic valve was not well visualized. Pulmonic valve regurgitation  is not visualized. Aorta: The ascending aorta was not well visualized, the aortic arch was not well visualized and the aortic root is normal in size and structure. Venous: The inferior vena cava is dilated in size with greater than 50% respiratory variability, suggesting right atrial pressure of 8 mmHg. IAS/Shunts: The interatrial septum was not well visualized.  LEFT VENTRICLE PLAX 2D LVIDd:         4.80 cm   Diastology LVIDs:         3.00 cm   LV e' medial:    10.60 cm/s LV PW:         1.80 cm   LV E/e' medial:  10.2 LV IVS:        1.60 cm   LV e' lateral:   15.90 cm/s LVOT diam:     2.30 cm   LV E/e' lateral: 6.8 LV SV:         79 LV SV Index:   28 LVOT Area:     4.15 cm  RIGHT VENTRICLE             IVC RV Basal diam:  5.20 cm     IVC diam: 2.70 cm RV Mid diam:    4.60 cm RV S prime:     10.80 cm/s TAPSE (M-mode): 1.9 cm LEFT ATRIUM              Index        RIGHT ATRIUM           Index LA diam:        4.90 cm  1.73 cm/m   RA Area:     43.90 cm LA Vol (A2C):   221.0 ml 77.93 ml/m  RA Volume:   190.00 ml 67.00 ml/m LA Vol (A4C):   144.0 ml 50.78 ml/m LA Biplane Vol: 193.0 ml 68.06 ml/m  AORTIC VALVE AV Area (Vmax):    3.52 cm AV Area (Vmean):   3.51 cm AV Area (VTI):     3.59 cm AV Vmax:           111.00 cm/s AV Vmean:          77.800 cm/s AV VTI:            0.221 m AV Peak Grad:      4.9 mmHg AV Mean Grad:      3.0 mmHg  LVOT Vmax:         94.00 cm/s LVOT Vmean:        65.700 cm/s LVOT VTI:          0.191 m LVOT/AV VTI ratio: 0.86  AORTA Ao Root diam: 4.10 cm Ao Asc diam:  3.90 cm MITRAL VALVE MV Area (PHT): 4.65 cm     SHUNTS MV Decel Time: 163 msec     Systemic VTI:  0.19 m MV E velocity: 108.00 cm/s  Systemic Diam: 2.30 cm MV A velocity: 43.70 cm/s MV E/A ratio:  2.47 Buford Dresser MD Electronically signed by Buford Dresser MD Signature Date/Time: 09/01/2021/11:55:06 AM    Final     Orson Eva, DO  [facility]  If 7PM-7AM, please contact night-coverage [email or web address] Password [email or web address] 09/01/2021, 5:13 PM   LOS: 1 day

## 2021-09-01 NOTE — Progress Notes (Signed)
Pt declined use of CPAP at this time. RT will have device on stand by if patient has  a need for it.

## 2021-09-01 NOTE — Progress Notes (Signed)
  Echocardiogram 2D Echocardiogram has been performed.  Gerda Diss 09/01/2021, 11:29 AM

## 2021-09-02 DIAGNOSIS — R001 Bradycardia, unspecified: Secondary | ICD-10-CM

## 2021-09-02 LAB — BASIC METABOLIC PANEL
Anion gap: 7 (ref 5–15)
BUN: 21 mg/dL (ref 8–23)
CO2: 27 mmol/L (ref 22–32)
Calcium: 8.5 mg/dL — ABNORMAL LOW (ref 8.9–10.3)
Chloride: 106 mmol/L (ref 98–111)
Creatinine, Ser: 1.11 mg/dL (ref 0.61–1.24)
GFR, Estimated: >60 mL/min (ref >60)
Glucose, Bld: 127 mg/dL — ABNORMAL HIGH (ref 70–99)
Potassium: 4.4 mmol/L (ref 3.5–5.1)
Sodium: 140 mmol/L (ref 135–145)

## 2021-09-02 LAB — CBC
HCT: 37.8 % — ABNORMAL LOW (ref 39.0–52.0)
Hemoglobin: 12.3 g/dL — ABNORMAL LOW (ref 13.0–17.0)
MCH: 32 pg (ref 26.0–34.0)
MCHC: 32.5 g/dL (ref 30.0–36.0)
MCV: 98.4 fL (ref 80.0–100.0)
Platelets: 156 10*3/uL (ref 150–400)
RBC: 3.84 MIL/uL — ABNORMAL LOW (ref 4.22–5.81)
RDW: 14.5 % (ref 11.5–15.5)
WBC: 14.7 10*3/uL — ABNORMAL HIGH (ref 4.0–10.5)
nRBC: 0 % (ref 0.0–0.2)

## 2021-09-02 LAB — MAGNESIUM: Magnesium: 2.3 mg/dL (ref 1.7–2.4)

## 2021-09-02 MED ORDER — FUROSEMIDE 10 MG/ML IJ SOLN
20.0000 mg | Freq: Once | INTRAMUSCULAR | Status: AC
  Administered 2021-09-02: 20 mg via INTRAVENOUS
  Filled 2021-09-02: qty 2

## 2021-09-02 NOTE — Plan of Care (Signed)

## 2021-09-02 NOTE — Progress Notes (Signed)
PROGRESS NOTE  Evan Warner MBW:[id number] DOB: 1950-12-05 DOA: 08/31/2021 PCP: Pcp, No  Brief History:  70 y.o. male with medical history of impaired glucose tolerance, myasthenia gravis, hypertension, atrial fibrillation, hyperlipidemia, BPH presenting with right leg pain, swelling, and erythema.  The patient states that he accidentally cut his right pretibial area with the edge of a mirror about 2 weeks prior to this admission.  He has noted some clear fluid drainage from the wound but has not noted any pus or blood.  Over this period of time he has noted some increasing swelling as well as some mild redness.  However over the past 24 hours he has noticed a significant increase in his right lower extremity and right foot erythema, pain, and edema.  As result, the patient presented for further evaluation.  He had subjective fevers and chills on the evening of 08/30/2021.  He denies any headache, neck pain, chest pain, shortness breath, cough, hemoptysis, nausea, vomiting, diarrhea, abdominal pain, dysuria, hematuria. Notably, the patient states that he experienced a accidental cut on his left leg on the edge of a table on 08/30/2021 and had difficulty getting it to stop bleeding.  He denies any increasing erythema, redness, or pain in the left lower extremity.  He has not been on any recent antibiotics.  The patient receives all his care at the [place]. In the emergency department, the patient was afebrile hemodynamically stable with oxygen saturation 93-94% on room air.  BMP showed sodium 140, potassium 2.9, serum creatinine 1.13.  AST 17, ALT 21, alk phosphatase 57, total bilirubin 1.6, albumin 3.8.  WBC 15.7, hemoglobin 14.2, platelets 175,000.  Lactic acid 1.5.  EKG showed atrial fibrillation with nonspecific ST changes.  X-rays of the right tib-fib area showed diffuse soft tissue swelling without any osseous abnormalities.   Assessment/Plan:   Sepsis -present on admssion -presented  with leukocytosis and tachypnea -Lactic acid 1.5 -Check PCT--0.12 -Continue empiric vancomycin and cefepime -Follow blood cultures--neg to date -sepsis physiology resolved   Cellulitis right lower extremity -Check ESR--14 -Check CRP 7.3>>21.8 -MRI right lower extremity--no abscess, no osteomyelitis -[place] legs>>no DVT  Bradycardia -am 09/02/21--bradycardia into 40s--asymptomatic -due to atenolol and pyridostigmine -d/c atenolol altogether -hold pyridostigmine for today -personally reviewed EKG--atrial fib with SVR   Myasthenia gravis -Patient is currently on prednisone 30 mg daily -Start patient on Solu-Medrol 40 mg IV daily for stress dosing -restart pyridostigmine   Impaired glucose tolerance -Review of the record showed that the patient had hemoglobin A1c of 6.1 on 04/02/2021 -Repeat hemoglobin A1c -Anticipate elevated CBGs secondary to steroids -NovoLog sliding scale   Essential hypertension -Holding lisinopril HCTZ and nifedipine secondary to soft blood pressure   Hyperlipidemia -Continue statin   Atrial fibrillation, type unspecified -Holding Xarelto temporarily -Restart atenolol lower dose due to soft BPs   Depression/anxiety -Continue fluoxetine   Gouty arthritis -Continue allopurinol -Check uric acid--5.4   BPH -Continue tamsulosin   Morbid Obesity -BMI 48.02 -lifestyle modification   Hypokalemia -replete -check mag   Leg Edema -lasix 20 mg IV x 1 on 10/22 and 10/23 -09/01/21 Echo--EF 60-65%, mod dilated RV, no WMA -venous duplex neg     Status is: Inpatient   Remains inpatient appropriate because: severity of illness requiring IV abx, meeting sepsis criteria               Family Communication:   spouse at bedside 09/01/21   Consultants:  none   Code  Status:  FULL    DVT Prophylaxis:  Xarelto--on hold     Procedures: As Listed in Progress Note Above   Antibiotics: Vanc 10/21>> Cefepime 10/21>>  Total time spent 35  minutes.  Greater than 50% spent face to face counseling and coordinating care.     Subjective: Patient denies fevers, chills, headache, chest pain, dyspnea, nausea, vomiting, diarrhea, abdominal pain, dysuria, hematuria, hematochezia, and melena. States that pain in right leg continues to improve.  Swelling a little better  Objective: Vitals:   09/01/21 2045 09/02/21 0512 09/02/21 0821 09/02/21 1016  BP: 101/81 112/69 120/88 139/74  Pulse: 90 76 65 (!) 49  Resp: 18 18    Temp: 98.3 F (36.8 C) 98.3 F (36.8 C)    TempSrc: Oral Oral    SpO2: 92% 96%    Weight:      Height:        Intake/Output Summary (Last 24 hours) at 09/02/2021 1121 Last data filed at 09/02/2021 0900 Gross per 24 hour  Intake 1285.46 ml  Output 501 ml  Net 784.46 ml   Weight change:  Exam:  General:  Pt is alert, follows commands appropriately, not in acute distress HEENT: No icterus, No thrush, No neck mass, [place]/AT Cardiovascular: IRRR, S1/S2, no rubs, no gallops Respiratory:fine bibasilar crackles.  No wheeze Abdomen: Soft/+BS, non tender, non distended, no guarding Extremities: 1 + LE edema, No lymphangitis, No petechiae, No rashes, no synovitis   Data Reviewed: I have personally reviewed following labs and imaging studies Basic Metabolic Panel: Recent Labs  Lab 08/31/21 1130 09/01/21 0553 09/02/21 0402  NA 140 138 140  K 2.9* 3.9 4.4  CL 102 103 106  CO2 30 24 27   GLUCOSE 114* 177* 127*  BUN 20 20 21   CREATININE 1.13 1.00 1.11  CALCIUM 8.8* 8.9 8.5*  MG  --   --  2.3   Liver Function Tests: Recent Labs  Lab 08/31/21 1130  AST 17  ALT 21  ALKPHOS 57  BILITOT 1.6*  PROT 6.7  ALBUMIN 3.8   No results for input(s): LIPASE, AMYLASE in the last 168 hours. No results for input(s): AMMONIA in the last 168 hours. Coagulation Profile: Recent Labs  Lab 08/31/21 1130  INR 1.3*   CBC: Recent Labs  Lab 08/31/21 1130 09/01/21 0553 09/02/21 0402  WBC 15.7* 17.7* 14.7*   NEUTROABS 13.1*  --   --   HGB 14.2 13.8 12.3*  HCT 43.2 43.2 37.8*  MCV 97.5 97.1 98.4  PLT 175 163 156   Cardiac Enzymes: No results for input(s): CKTOTAL, CKMB, CKMBINDEX, TROPONINI in the last 168 hours. BNP: Invalid input(s): POCBNP CBG: No results for input(s): GLUCAP in the last 168 hours. HbA1C: No results for input(s): HGBA1C in the last 72 hours. Urine analysis:    Component Value Date/Time   COLORURINE YELLOW 07/25/2016 [place] 07/25/2016 1431   LABSPEC 1.018 07/25/2016 1431   PHURINE 6.5 07/25/2016 1431   GLUCOSEU NEGATIVE 07/25/2016 1431   HGBUR MODERATE (A) 07/25/2016 1431   BILIRUBINUR NEGATIVE 07/25/2016 1431   KETONESUR NEGATIVE 07/25/2016 1431   PROTEINUR NEGATIVE 07/25/2016 1431   UROBILINOGEN 0.2 09/10/2010 1331   NITRITE NEGATIVE 07/25/2016 1431   LEUKOCYTESUR NEGATIVE 07/25/2016 1431   Sepsis Labs: @LABRCNTIP (procalcitonin:4,lacticidven:4) ) Recent Results (from the past 240 hour(s))  Blood Culture (routine x 2)     Status: None (Preliminary result)   Collection Time: 08/31/21 11:30 AM   Specimen: Right Antecubital; Blood  Result  Value Ref Range Status   Specimen Description   Final    RIGHT ANTECUBITAL BOTTLES DRAWN AEROBIC AND ANAEROBIC   Special Requests   Final    Blood Culture results may not be optimal due to an excessive volume of blood received in culture bottles   Culture   Final    NO GROWTH 2 DAYS Performed at [facility], [address], [place], [place] [address]    Report Status PENDING  Incomplete  Resp Panel by RT-PCR (Flu A&B, Covid) Nasopharyngeal Swab     Status: None   Collection Time: 08/31/21 11:54 AM   Specimen: Nasopharyngeal Swab; Nasopharyngeal(NP) swabs in vial transport medium  Result Value Ref Range Status   SARS Coronavirus 2 by RT PCR NEGATIVE NEGATIVE Final    Comment: (NOTE) SARS-CoV-2 target nucleic acids are NOT DETECTED.  The SARS-CoV-2 RNA is generally detectable in upper  respiratory specimens during the acute phase of infection. The lowest concentration of SARS-CoV-2 viral copies this assay can detect is 138 copies/mL. A negative result does not preclude SARS-Cov-2 infection and should not be used as the sole basis for treatment or other patient management decisions. A negative result may occur with  improper specimen collection/handling, submission of specimen other than nasopharyngeal swab, presence of viral mutation(s) within the areas targeted by this assay, and inadequate number of viral copies(<138 copies/mL). A negative result must be combined with clinical observations, patient history, and epidemiological information. The expected result is Negative.  Fact Sheet for Patients:  EntrepreneurPulse.com.au  Fact Sheet for Healthcare Providers:  IncredibleEmployment.be  This test is no t yet approved or cleared by the [place] FDA and  has been authorized for detection and/or diagnosis of SARS-CoV-2 by FDA under an Emergency Use Authorization (EUA). This EUA will remain  in effect (meaning this test can be used) for the duration of the COVID-19 declaration under Section 564(b)(1) of the Act, 21 U.S.C.section 360bbb-3(b)(1), unless the authorization is terminated  or revoked sooner.       Influenza A by PCR NEGATIVE NEGATIVE Final   Influenza B by PCR NEGATIVE NEGATIVE Final    Comment: (NOTE) The Xpert Xpress SARS-CoV-2/FLU/RSV plus assay is intended as an aid in the diagnosis of influenza from Nasopharyngeal swab specimens and should not be used as a sole basis for treatment. Nasal washings and aspirates are unacceptable for Xpert Xpress SARS-CoV-2/FLU/RSV testing.  Fact Sheet for Patients: EntrepreneurPulse.com.au  Fact Sheet for Healthcare Providers: IncredibleEmployment.be  This test is not yet approved or cleared by the [place] FDA and has been  authorized for detection and/or diagnosis of SARS-CoV-2 by FDA under an Emergency Use Authorization (EUA). This EUA will remain in effect (meaning this test can be used) for the duration of the COVID-19 declaration under Section 564(b)(1) of the Act, 21 U.S.C. section 360bbb-3(b)(1), unless the authorization is terminated or revoked.  Performed at [facility], [address] [place], [place] [address]   Blood Culture (routine x 2)     Status: None (Preliminary result)   Collection Time: 08/31/21 12:36 PM   Specimen: BLOOD LEFT ARM  Result Value Ref Range Status   Specimen Description BLOOD LEFT ARM  Final   Special Requests   Final    Blood Culture adequate volume BOTTLES DRAWN AEROBIC AND ANAEROBIC   Culture   Final    NO GROWTH 2 DAYS Performed at [facility], [address], [place], [place] [address]    Report Status PENDING  Incomplete  Scheduled Meds:  allopurinol  300 mg Oral Daily   FLUoxetine  20 mg Oral Daily   methylPREDNISolone (SOLU-MEDROL) injection  40 mg Intravenous Daily   pantoprazole  40 mg Oral Daily   tamsulosin  0.4 mg Oral QHS   Continuous Infusions:  ceFEPime (MAXIPIME) IV 2 g (09/02/21 0520)   vancomycin 1,250 mg (09/02/21 0333)    Procedures/Studies: DG Tibia/Fibula Left  Result Date: 08/31/2021 CLINICAL DATA:  Anterior wound EXAM: LEFT TIBIA AND FIBULA - 2 VIEW COMPARISON:  None. FINDINGS: Left total knee prosthesis is well seated without periprosthetic fracture or lucency. Well corticated ossific fragment adjacent to the tip of the lateral malleolus likely sequelae of remote fracture. Diffuse soft tissue edema. No osseous erosions identified to indicate osteomyelitis. IMPRESSION: Diffuse soft tissue edema of the left lower leg without underlying acute osseous abnormality. Electronically Signed   By: Miachel Roux M.D.   On: 08/31/2021 12:57   DG Tibia/Fibula Right  Result Date: 08/31/2021 CLINICAL DATA:  Anterior leg wound. EXAM: RIGHT TIBIA  AND FIBULA - 2 VIEW COMPARISON:  None. FINDINGS: Right knee replacement in satisfactory position alignment. No loosening or complication. Negative for fracture of the tibia or fibula. No bone lesion or osteomyelitis. Degenerative changes present in the ankle joint with mild joint space narrowing spurring. Diffuse soft tissue swelling around the ankle. IMPRESSION: Satisfactory right knee replacement Mild ankle degenerative change. Diffuse soft tissue swelling around the ankle No acute skeletal finding. Electronically Signed   By: Franchot Gallo M.D.   On: 08/31/2021 12:56   MR TIBIA FIBULA RIGHT WO CONTRAST  Result Date: 08/31/2021 CLINICAL DATA:  Redness and inflammation in the right lower leg. Soft tissue infection suspected. EXAM: MRI OF LOWER RIGHT EXTREMITY WITHOUT CONTRAST TECHNIQUE: Multiplanar, multisequence MR imaging of the right lower leg was performed. No intravenous contrast was administered. COMPARISON:  Radiographs 08/31/2021. FINDINGS: Bones/Joint/Cartilage Both lower legs are included on the coronal images. Patient is status post bilateral total knee arthroplasty. The right tibia and fibula appear normal, without evidence of acute fracture, dislocation or osteomyelitis. There is no large knee or ankle joint effusion. Ligaments Status post right total knee arthroplasty. Ankle findings dictated separately. Muscles and Tendons The right lower leg muscles appear unremarkable. The visualized extensor mechanism at the right knee appears intact. The visualized ankle tendons appear intact. Soft tissues There is generalized subcutaneous edema within both lower legs, worse on the right. No focal fluid collections are identified on noncontrast imaging. There are asymmetric varicosities posteriorly at the right knee. IMPRESSION: 1. Nonspecific generalized subcutaneous edema within both lower legs, right greater than left. This could represent cellulitis, venous insufficiency, lymphedema or heart failure. No  focal fluid collection identified. 2. No evidence of deep soft tissue infection, osteomyelitis or significant joint effusion. 3. Ankle and foot findings dictated separately. Electronically Signed   By: Richardean Sale M.D.   On: 08/31/2021 17:36   MR FOOT RIGHT WO CONTRAST  Result Date: 08/31/2021 CLINICAL DATA:  Redness and inflammation in the right lower leg. Soft tissue infection suspected. History of diabetes. EXAM: MRI OF THE RIGHT FOREFOOT WITHOUT CONTRAST TECHNIQUE: Multiplanar, multisequence MR imaging of the right forefoot was performed. No intravenous contrast was administered. COMPARISON:  None. FINDINGS: Despite efforts by the technologist and patient, mild motion artifact is present on today's exam and could not be eliminated. This reduces exam sensitivity and specificity. Bones/Joint/Cartilage No evidence of acute fracture, dislocation or bone destruction. There are midfoot degenerative changes with scattered subchondral cysts,  largest in the middle cuneiform bone. The alignment is normal at the Lisfranc joint. There are moderate degenerative changes at the 1st metatarsophalangeal joint, associated with a hallux valgus deformity. No significant joint effusions. Ligaments The Lisfranc ligament is intact. The collateral ligaments of the metatarsophalangeal joints appear intact. Muscles and Tendons The forefoot muscles and tendons appear unremarkable. No significant tenosynovitis. Soft tissues Generalized subcutaneous edema, greatest in the dorsal aspect of the foot. No focal fluid collection or foreign body identified. IMPRESSION: 1. Nonspecific dorsal subcutaneous edema as can be seen with cellulitis, venous insufficiency, heart failure or lymphedema. No focal fluid collection identified. 2. No evidence of focal fluid collection, deep soft tissue infection or osteomyelitis. 3. Midfoot and 1st MTP degenerative changes. Electronically Signed   By: Richardean Sale M.D.   On: 08/31/2021 17:40   MR  ANKLE RIGHT WO CONTRAST  Result Date: 08/31/2021 CLINICAL DATA:  Redness and inflammation in the right lower leg. Soft tissue infection suspected. History of diabetes. EXAM: MRI OF THE RIGHT ANKLE WITHOUT CONTRAST TECHNIQUE: Multiplanar, multisequence MR imaging of the ankle was performed. No intravenous contrast was administered. COMPARISON:  Radiographs 08/31/2021 FINDINGS: TENDONS Peroneal: Intact and normally positioned. Posteromedial: Mild posterior tibialis tendinosis with probable mild longitudinal split tearing. The additional medial flexor tendons appear intact. No significant tendon sheath fluid. Anterior: Intact and normally positioned. Achilles: Intact. Plantar Fascia: Mild thickening of the central cord of the plantar fascia without tear. Small adjacent plantar calcaneal spur. LIGAMENTS Lateral: The anterior and posterior talofibular and calcaneofibular ligaments are intact.The inferior tibiofibular ligaments appear intact. Medial: The deltoid and visualized portions of the spring ligament appear intact. CARTILAGE AND BONES Ankle Joint: No significant ankle joint effusion. There are mild tibiotalar degenerative changes with prominent subchondral cysts laterally in the tibial plafond. Subtalar Joints/Sinus Tarsi: Mild subtalar degenerative changes. There is mild edema in the tarsal sinus. No focal fluid collection. Bones: No evidence of acute fracture, dislocation or bone destruction. Other: Generalized subcutaneous edema surrounding the ankle without focal fluid collection. IMPRESSION: 1. Generalized subcutaneous edema surrounding the ankle, as can be seen with cellulitis, venous insufficiency, heart failure or lymphedema. 2. No evidence of osteomyelitis, septic joint or abscess. 3. Mild tibiotalar and hindfoot degenerative changes. 4. Foot and lower leg findings dictated separately. Electronically Signed   By: Richardean Sale M.D.   On: 08/31/2021 17:44   US Venous Img Lower Bilateral  (DVT)  Result Date: 09/01/2021 CLINICAL DATA:  70 year old male with right greater than left lower extremity edema and pain for 2 weeks. EXAM: BILATERAL LOWER EXTREMITY VENOUS DOPPLER ULTRASOUND TECHNIQUE: Gray-scale sonography with compression, as well as color and duplex ultrasound, were performed to evaluate the deep venous system(s) from the level of the common femoral vein through the popliteal and proximal calf veins. COMPARISON:  None. FINDINGS: VENOUS Normal compressibility of the bilateral common femoral, superficial femoral, and popliteal veins, as well as the visualized calf veins. Visualized portions of bilateral profunda femoral vein and great saphenous vein unremarkable. No filling defects to suggest DVT on grayscale or color Doppler imaging. Doppler waveforms show normal direction of venous flow, normal respiratory plasticity and response to augmentation. Limited views of the contralateral common femoral vein are unremarkable. OTHER Right calf edema (image 31). Limitations: none IMPRESSION: No evidence of bilateral lower extremity deep venous thrombosis. Electronically Signed   By: Genevie Ann M.D.   On: 09/01/2021 10:45   ECHOCARDIOGRAM COMPLETE  Result Date: 09/01/2021    ECHOCARDIOGRAM REPORT   Patient Name:  Evan Warner Date of Exam: 09/01/2021 Medical Rec #:  [id number]         Height:       74.0 in Accession #:    [id number]        Weight:       374.0 lb Date of Birth:  [birth year]          BSA:          2.836 m Patient Age:    10 years          BP:           117/65 mmHg Patient Gender: M                 HR:           68 bpm. Exam Location:  Inpatient Procedure: 2D Echo, Cardiac Doppler, Color Doppler and Intracardiac            Opacification Agent Indications:    Dyspnea  History:        Patient has no prior history of Echocardiogram examinations.                 Arrythmias:Atrial Fibrillation; Risk Factors:Hypertension and                 Dyslipidemia.  Sonographer:    Clayton Lefort RDCS  (AE) Referring Phys: [phone] Tonna Palazzi  Sonographer Comments: Technically difficult study due to poor echo windows and patient is morbidly obese. Image acquisition challenging due to patient body habitus. IMPRESSIONS  1. Left ventricular ejection fraction, by estimation, is 60 to 65%. The left ventricle has normal function. The left ventricle has no regional wall motion abnormalities. There is moderate concentric left ventricular hypertrophy. Left ventricular diastolic function could not be evaluated.  2. Right ventricular systolic function is normal. The right ventricular size is moderately enlarged. Tricuspid regurgitation signal is inadequate for assessing PA pressure.  3. Left atrial size was severely dilated.  4. Right atrial size was severely dilated.  5. The mitral valve is grossly normal. Trivial mitral valve regurgitation. No evidence of mitral stenosis.  6. The aortic valve is grossly normal. Aortic valve regurgitation is not visualized. No aortic stenosis is present.  7. The inferior vena cava is dilated in size with >50% respiratory variability, suggesting right atrial pressure of 8 mmHg. Comparison(s): No prior Echocardiogram. Conclusion(s)/Recommendation(s): Technically challenging study, even with use of echo contrast. Normal LVEF without significant focal wall motion abnormalities. RV funtion appears normal, but RV size moderately dilated. Cannot estimate RVSP due to lack of TR jet. Severe biatrial enlargement. FINDINGS  Left Ventricle: Left ventricular ejection fraction, by estimation, is 60 to 65%. The left ventricle has normal function. The left ventricle has no regional wall motion abnormalities. Definity contrast agent was given IV to delineate the left ventricular  endocardial borders. The left ventricular internal cavity size was normal in size. There is moderate concentric left ventricular hypertrophy. Left ventricular diastolic function could not be evaluated due to atrial fibrillation. Left  ventricular diastolic function could not be evaluated. Right Ventricle: The right ventricular size is moderately enlarged. Right vetricular wall thickness was not well visualized. Right ventricular systolic function is normal. Tricuspid regurgitation signal is inadequate for assessing PA pressure. Left Atrium: Left atrial size was severely dilated. Right Atrium: Right atrial size was severely dilated. Pericardium: Trivial pericardial effusion is present. Presence of pericardial fat pad. Mitral Valve: The mitral valve is grossly normal. Trivial mitral valve regurgitation. No evidence  of mitral valve stenosis. Tricuspid Valve: The tricuspid valve is grossly normal. Tricuspid valve regurgitation is trivial. No evidence of tricuspid stenosis. Aortic Valve: The aortic valve is grossly normal. Aortic valve regurgitation is not visualized. No aortic stenosis is present. Aortic valve mean gradient measures 3.0 mmHg. Aortic valve peak gradient measures 4.9 mmHg. Aortic valve area, by VTI measures 3.59 cm. Pulmonic Valve: The pulmonic valve was not well visualized. Pulmonic valve regurgitation is not visualized. Aorta: The ascending aorta was not well visualized, the aortic arch was not well visualized and the aortic root is normal in size and structure. Venous: The inferior vena cava is dilated in size with greater than 50% respiratory variability, suggesting right atrial pressure of 8 mmHg. IAS/Shunts: The interatrial septum was not well visualized.  LEFT VENTRICLE PLAX 2D LVIDd:         4.80 cm   Diastology LVIDs:         3.00 cm   LV e' medial:    10.60 cm/s LV PW:         1.80 cm   LV E/e' medial:  10.2 LV IVS:        1.60 cm   LV e' lateral:   15.90 cm/s LVOT diam:     2.30 cm   LV E/e' lateral: 6.8 LV SV:         79 LV SV Index:   28 LVOT Area:     4.15 cm  RIGHT VENTRICLE             IVC RV Basal diam:  5.20 cm     IVC diam: 2.70 cm RV Mid diam:    4.60 cm RV S prime:     10.80 cm/s TAPSE (M-mode): 1.9 cm LEFT  ATRIUM              Index        RIGHT ATRIUM           Index LA diam:        4.90 cm  1.73 cm/m   RA Area:     43.90 cm LA Vol (A2C):   221.0 ml 77.93 ml/m  RA Volume:   190.00 ml 67.00 ml/m LA Vol (A4C):   144.0 ml 50.78 ml/m LA Biplane Vol: 193.0 ml 68.06 ml/m  AORTIC VALVE AV Area (Vmax):    3.52 cm AV Area (Vmean):   3.51 cm AV Area (VTI):     3.59 cm AV Vmax:           111.00 cm/s AV Vmean:          77.800 cm/s AV VTI:            0.221 m AV Peak Grad:      4.9 mmHg AV Mean Grad:      3.0 mmHg LVOT Vmax:         94.00 cm/s LVOT Vmean:        65.700 cm/s LVOT VTI:          0.191 m LVOT/AV VTI ratio: 0.86  AORTA Ao Root diam: 4.10 cm Ao Asc diam:  3.90 cm MITRAL VALVE MV Area (PHT): 4.65 cm     SHUNTS MV Decel Time: 163 msec     Systemic VTI:  0.19 m MV E velocity: 108.00 cm/s  Systemic Diam: 2.30 cm MV A velocity: 43.70 cm/s MV E/A ratio:  2.47 Buford Dresser MD Electronically signed by Buford Dresser MD Signature Date/Time: 09/01/2021/11:55:06 AM  Final     Orson Eva, DO  [facility]  If 7PM-7AM, please contact night-coverage [email or web address] Password [email or web address] 09/02/2021, 11:21 AM   LOS: 2 days

## 2021-09-03 DIAGNOSIS — G7000 Myasthenia gravis without (acute) exacerbation: Secondary | ICD-10-CM

## 2021-09-03 LAB — CBC
HCT: 37.2 % — ABNORMAL LOW (ref 39.0–52.0)
Hemoglobin: 12.1 g/dL — ABNORMAL LOW (ref 13.0–17.0)
MCH: 31.7 pg (ref 26.0–34.0)
MCHC: 32.5 g/dL (ref 30.0–36.0)
MCV: 97.4 fL (ref 80.0–100.0)
Platelets: 178 10*3/uL (ref 150–400)
RBC: 3.82 MIL/uL — ABNORMAL LOW (ref 4.22–5.81)
RDW: 14.2 % (ref 11.5–15.5)
WBC: 10.9 10*3/uL — ABNORMAL HIGH (ref 4.0–10.5)
nRBC: 0 % (ref 0.0–0.2)

## 2021-09-03 LAB — BASIC METABOLIC PANEL
Anion gap: 5 (ref 5–15)
BUN: 24 mg/dL — ABNORMAL HIGH (ref 8–23)
CO2: 27 mmol/L (ref 22–32)
Calcium: 8.7 mg/dL — ABNORMAL LOW (ref 8.9–10.3)
Chloride: 107 mmol/L (ref 98–111)
Creatinine, Ser: 0.96 mg/dL (ref 0.61–1.24)
GFR, Estimated: >60 mL/min (ref >60)
Glucose, Bld: 131 mg/dL — ABNORMAL HIGH (ref 70–99)
Potassium: 3.9 mmol/L (ref 3.5–5.1)
Sodium: 139 mmol/L (ref 135–145)

## 2021-09-03 LAB — MAGNESIUM: Magnesium: 2.2 mg/dL (ref 1.7–2.4)

## 2021-09-03 LAB — HEMOGLOBIN A1C
Hgb A1c MFr Bld: 5.8 % — ABNORMAL HIGH (ref 4.8–5.6)
Mean Plasma Glucose: 120 mg/dL

## 2021-09-03 MED ORDER — NIFEDIPINE ER OSMOTIC RELEASE 30 MG PO TB24
30.0000 mg | ORAL_TABLET | Freq: Every day | ORAL | Status: DC
  Administered 2021-09-03: 30 mg via ORAL
  Filled 2021-09-03: qty 1

## 2021-09-03 MED ORDER — AMOXICILLIN-POT CLAVULANATE 875-125 MG PO TABS
1.0000 | ORAL_TABLET | Freq: Two times a day (BID) | ORAL | Status: DC
  Administered 2021-09-03: 1 via ORAL
  Filled 2021-09-03: qty 1

## 2021-09-03 MED ORDER — DOXYCYCLINE HYCLATE 100 MG PO TABS
100.0000 mg | ORAL_TABLET | Freq: Two times a day (BID) | ORAL | Status: DC
  Administered 2021-09-03: 100 mg via ORAL
  Filled 2021-09-03: qty 1

## 2021-09-03 MED ORDER — AMOXICILLIN-POT CLAVULANATE 875-125 MG PO TABS: 1.0000 | ORAL_TABLET | Freq: Two times a day (BID) | ORAL | 0 refills | Status: AC

## 2021-09-03 MED ORDER — ATORVASTATIN CALCIUM 40 MG PO TABS
40.0000 mg | ORAL_TABLET | Freq: Every day | ORAL | Status: DC
  Administered 2021-09-03: 40 mg via ORAL
  Filled 2021-09-03: qty 1

## 2021-09-03 MED ORDER — TORSEMIDE 20 MG PO TABS
20.0000 mg | ORAL_TABLET | Freq: Once | ORAL | Status: AC
  Administered 2021-09-03: 20 mg via ORAL
  Filled 2021-09-03: qty 1

## 2021-09-03 MED ORDER — DOXYCYCLINE HYCLATE 100 MG PO TABS: 100.0000 mg | ORAL_TABLET | Freq: Two times a day (BID) | ORAL | 0 refills | Status: AC

## 2021-09-03 NOTE — TOC Progression Note (Signed)
[facility] notification completed. Notification number is [phone].

## 2021-09-03 NOTE — Progress Notes (Signed)
Discharge paper work given and reviewed with patient. Patient understood discharge instructions. IV removed. Patient is waiting for ride.

## 2021-09-03 NOTE — Discharge Summary (Signed)
Physician Discharge Summary  Evan Warner SHF:[id number] DOB: December 19, 1950 DOA: 08/31/2021  PCP: Pcp, No  Admit date: 08/31/2021 Discharge date: 09/03/2021  Admitted From: Home Disposition:  Home   Recommendations for Outpatient Follow-up:  Follow up with PCP in 1-2 weeks Please obtain BMP/CBC in one week    Discharge Condition: Stable CODE STATUS: FULL Diet recommendation: Heart Healthy / Carb Modified    Brief/Interim Summary: 70 y.o. male with medical history of impaired glucose tolerance, myasthenia gravis, hypertension, atrial fibrillation, hyperlipidemia, BPH presenting with right leg pain, swelling, and erythema.  The patient states that he accidentally cut his right pretibial area with the edge of a mirror about 2 weeks prior to this admission.  He has noted some clear fluid drainage from the wound but has not noted any pus or blood.  Over this period of time he has noted some increasing swelling as well as some mild redness.  However over the past 24 hours he has noticed a significant increase in his right lower extremity and right foot erythema, pain, and edema.  As result, the patient presented for further evaluation.  He had subjective fevers and chills on the evening of 08/30/2021.  He denies any headache, neck pain, chest pain, shortness breath, cough, hemoptysis, nausea, vomiting, diarrhea, abdominal pain, dysuria, hematuria. Notably, the patient states that he experienced a accidental cut on his left leg on the edge of a table on 08/30/2021 and had difficulty getting it to stop bleeding.  He denies any increasing erythema, redness, or pain in the left lower extremity.  He has not been on any recent antibiotics.  The patient receives all his care at the [place]. In the emergency department, the patient was afebrile hemodynamically stable with oxygen saturation 93-94% on room air.  BMP showed sodium 140, potassium 2.9, serum creatinine 1.13.  AST 17, ALT 21, alk phosphatase 57,  total bilirubin 1.6, albumin 3.8.  WBC 15.7, hemoglobin 14.2, platelets 175,000.  Lactic acid 1.5.  EKG showed atrial fibrillation with nonspecific ST changes.  X-rays of the right tib-fib area showed diffuse soft tissue swelling without any osseous abnormalities.  He was started on vanco and cefepime with clinical improvement.  D/c home with doxy and amox/clav x 7 more days  Discharge Diagnoses:  Sepsis -present on admssion -presented with leukocytosis and tachypnea -Lactic acid 1.5 -Check PCT--0.12 -Continue empiric vancomycin and cefepime -Follow blood cultures--neg to date -sepsis physiology resolved -WBC improved 17/17>>10.9   Cellulitis right lower extremity -Check ESR--14 -Check CRP 7.3>>21.8 -MRI right lower extremity--no abscess, no osteomyelitis -[place] legs>>no DVT -d/c home with doxy and amox/clav x 7 more days   Bradycardia -am 09/02/21--bradycardia into 40s--asymptomatic -due to atenolol and pyridostigmine -d/c atenolol altogether -hold pyridostigmine 24 hrs with improvement of HR back to upper 60s -personally reviewed EKG--atrial fib with SVR -restart pyridostigmine after d/c without atenolol   Myasthenia gravis -Patient is currently on prednisone 30 mg daily -Start patient on Solu-Medrol 40 mg IV daily for stress dosing -restart pyridostigmine   Impaired glucose tolerance -Review of the record showed that the patient had hemoglobin A1c of 6.1 on 04/02/2021 -Repeat hemoglobin A1c--pending at time of d/c -Anticipate elevated CBGs secondary to steroids -NovoLog sliding scale   Essential hypertension -Holding lisinopril HCTZ and nifedipine secondary to soft blood pressure   Hyperlipidemia -Continue statin   Atrial fibrillation, type unspecified -Holding Xarelto temporarily>>restart after d/c   Depression/anxiety -Continue fluoxetine   Gouty arthritis -Continue allopurinol -Check uric acid--5.4   BPH -Continue tamsulosin  Morbid Obesity -BMI  48.02 -lifestyle modification   Hypokalemia -replete -check mag   Leg Edema -lasix 20 mg IV x 1 on 10/22 and 10/23 -09/01/21 Echo--EF 60-65%, mod dilated RV, no WMA -venous duplex neg    Discharge Instructions   Allergies as of 09/03/2021       Reactions   No Known Allergies         Medication List     STOP taking these medications    atenolol 50 MG tablet Commonly known as: TENORMIN   dabigatran 150 MG Caps capsule Commonly known as: PRADAXA   docusate sodium 100 MG capsule Commonly known as: COLACE   fish oil-omega-3 fatty acids 1000 MG capsule   methocarbamol 500 MG tablet Commonly known as: ROBAXIN   pantoprazole 40 MG tablet Commonly known as: PROTONIX       TAKE these medications    allopurinol 300 MG tablet Commonly known as: ZYLOPRIM Take 150 mg by mouth daily.   amoxicillin-clavulanate 875-125 MG tablet Commonly known as: AUGMENTIN Take 1 tablet by mouth every 12 (twelve) hours.   aspirin 81 MG chewable tablet Chew 81 mg by mouth daily.   calcium carbonate 500 MG chewable tablet Commonly known as: TUMS - dosed in mg elemental calcium Chew 1 tablet by mouth daily.   Coenzyme Q-10 100 MG capsule Take 100 mg by mouth daily.   diclofenac Sodium 1 % Gel Commonly known as: VOLTAREN Apply 2 g topically daily as needed (pain).   doxycycline 100 MG tablet Commonly known as: VIBRA-TABS Take 1 tablet (100 mg total) by mouth every 12 (twelve) hours.   FLUoxetine 20 MG capsule Commonly known as: PROZAC Take 20 mg by mouth daily.   lisinopril-hydrochlorothiazide 20-12.5 MG tablet Commonly known as: ZESTORETIC Take 2 tablets by mouth daily.   NIFEdipine 30 MG 24 hr tablet Commonly known as: ADALAT CC Take 30 mg by mouth daily.   nitroGLYCERIN 0.4 MG SL tablet Commonly known as: NITROSTAT Place 0.4 mg under the tongue as needed.   NON FORMULARY c-papa  And O2   predniSONE 20 MG tablet Commonly known as: DELTASONE Take 30 mg  by mouth daily.   pyridostigmine 60 MG tablet Commonly known as: MESTINON Take 30 mg by mouth 3 (three) times daily.   rivaroxaban 20 MG Tabs tablet Commonly known as: XARELTO Take 20 mg by mouth daily.   simvastatin 80 MG tablet Commonly known as: ZOCOR Take 80 mg by mouth daily.   tamsulosin 0.4 MG Caps capsule Commonly known as: FLOMAX Take 0.8 mg by mouth at bedtime.   Thera-Gesic 0.5-15 % Crea APPLY SMALL AMOUNT TO AFFECTED AREA TWICE A DAY AS NEEDED TO LEFT SHOULDER FOR PAIN        Allergies  Allergen Reactions   No Known Allergies     Consultations: none   Procedures/Studies: DG Tibia/Fibula Left  Result Date: 08/31/2021 CLINICAL DATA:  Anterior wound EXAM: LEFT TIBIA AND FIBULA - 2 VIEW COMPARISON:  None. FINDINGS: Left total knee prosthesis is well seated without periprosthetic fracture or lucency. Well corticated ossific fragment adjacent to the tip of the lateral malleolus likely sequelae of remote fracture. Diffuse soft tissue edema. No osseous erosions identified to indicate osteomyelitis. IMPRESSION: Diffuse soft tissue edema of the left lower leg without underlying acute osseous abnormality. Electronically Signed   By: Miachel Roux M.D.   On: 08/31/2021 12:57   DG Tibia/Fibula Right  Result Date: 08/31/2021 CLINICAL DATA:  Anterior leg wound. EXAM: RIGHT TIBIA  AND FIBULA - 2 VIEW COMPARISON:  None. FINDINGS: Right knee replacement in satisfactory position alignment. No loosening or complication. Negative for fracture of the tibia or fibula. No bone lesion or osteomyelitis. Degenerative changes present in the ankle joint with mild joint space narrowing spurring. Diffuse soft tissue swelling around the ankle. IMPRESSION: Satisfactory right knee replacement Mild ankle degenerative change. Diffuse soft tissue swelling around the ankle No acute skeletal finding. Electronically Signed   By: Franchot Gallo M.D.   On: 08/31/2021 12:56   MR TIBIA FIBULA RIGHT WO  CONTRAST  Result Date: 08/31/2021 CLINICAL DATA:  Redness and inflammation in the right lower leg. Soft tissue infection suspected. EXAM: MRI OF LOWER RIGHT EXTREMITY WITHOUT CONTRAST TECHNIQUE: Multiplanar, multisequence MR imaging of the right lower leg was performed. No intravenous contrast was administered. COMPARISON:  Radiographs 08/31/2021. FINDINGS: Bones/Joint/Cartilage Both lower legs are included on the coronal images. Patient is status post bilateral total knee arthroplasty. The right tibia and fibula appear normal, without evidence of acute fracture, dislocation or osteomyelitis. There is no large knee or ankle joint effusion. Ligaments Status post right total knee arthroplasty. Ankle findings dictated separately. Muscles and Tendons The right lower leg muscles appear unremarkable. The visualized extensor mechanism at the right knee appears intact. The visualized ankle tendons appear intact. Soft tissues There is generalized subcutaneous edema within both lower legs, worse on the right. No focal fluid collections are identified on noncontrast imaging. There are asymmetric varicosities posteriorly at the right knee. IMPRESSION: 1. Nonspecific generalized subcutaneous edema within both lower legs, right greater than left. This could represent cellulitis, venous insufficiency, lymphedema or heart failure. No focal fluid collection identified. 2. No evidence of deep soft tissue infection, osteomyelitis or significant joint effusion. 3. Ankle and foot findings dictated separately. Electronically Signed   By: Richardean Sale M.D.   On: 08/31/2021 17:36   MR FOOT RIGHT WO CONTRAST  Result Date: 08/31/2021 CLINICAL DATA:  Redness and inflammation in the right lower leg. Soft tissue infection suspected. History of diabetes. EXAM: MRI OF THE RIGHT FOREFOOT WITHOUT CONTRAST TECHNIQUE: Multiplanar, multisequence MR imaging of the right forefoot was performed. No intravenous contrast was administered.  COMPARISON:  None. FINDINGS: Despite efforts by the technologist and patient, mild motion artifact is present on today's exam and could not be eliminated. This reduces exam sensitivity and specificity. Bones/Joint/Cartilage No evidence of acute fracture, dislocation or bone destruction. There are midfoot degenerative changes with scattered subchondral cysts, largest in the middle cuneiform bone. The alignment is normal at the Lisfranc joint. There are moderate degenerative changes at the 1st metatarsophalangeal joint, associated with a hallux valgus deformity. No significant joint effusions. Ligaments The Lisfranc ligament is intact. The collateral ligaments of the metatarsophalangeal joints appear intact. Muscles and Tendons The forefoot muscles and tendons appear unremarkable. No significant tenosynovitis. Soft tissues Generalized subcutaneous edema, greatest in the dorsal aspect of the foot. No focal fluid collection or foreign body identified. IMPRESSION: 1. Nonspecific dorsal subcutaneous edema as can be seen with cellulitis, venous insufficiency, heart failure or lymphedema. No focal fluid collection identified. 2. No evidence of focal fluid collection, deep soft tissue infection or osteomyelitis. 3. Midfoot and 1st MTP degenerative changes. Electronically Signed   By: Richardean Sale M.D.   On: 08/31/2021 17:40   MR ANKLE RIGHT WO CONTRAST  Result Date: 08/31/2021 CLINICAL DATA:  Redness and inflammation in the right lower leg. Soft tissue infection suspected. History of diabetes. EXAM: MRI OF THE RIGHT ANKLE WITHOUT CONTRAST  TECHNIQUE: Multiplanar, multisequence MR imaging of the ankle was performed. No intravenous contrast was administered. COMPARISON:  Radiographs 08/31/2021 FINDINGS: TENDONS Peroneal: Intact and normally positioned. Posteromedial: Mild posterior tibialis tendinosis with probable mild longitudinal split tearing. The additional medial flexor tendons appear intact. No significant tendon  sheath fluid. Anterior: Intact and normally positioned. Achilles: Intact. Plantar Fascia: Mild thickening of the central cord of the plantar fascia without tear. Small adjacent plantar calcaneal spur. LIGAMENTS Lateral: The anterior and posterior talofibular and calcaneofibular ligaments are intact.The inferior tibiofibular ligaments appear intact. Medial: The deltoid and visualized portions of the spring ligament appear intact. CARTILAGE AND BONES Ankle Joint: No significant ankle joint effusion. There are mild tibiotalar degenerative changes with prominent subchondral cysts laterally in the tibial plafond. Subtalar Joints/Sinus Tarsi: Mild subtalar degenerative changes. There is mild edema in the tarsal sinus. No focal fluid collection. Bones: No evidence of acute fracture, dislocation or bone destruction. Other: Generalized subcutaneous edema surrounding the ankle without focal fluid collection. IMPRESSION: 1. Generalized subcutaneous edema surrounding the ankle, as can be seen with cellulitis, venous insufficiency, heart failure or lymphedema. 2. No evidence of osteomyelitis, septic joint or abscess. 3. Mild tibiotalar and hindfoot degenerative changes. 4. Foot and lower leg findings dictated separately. Electronically Signed   By: Richardean Sale M.D.   On: 08/31/2021 17:44   US Venous Img Lower Bilateral (DVT)  Result Date: 09/01/2021 CLINICAL DATA:  70 year old male with right greater than left lower extremity edema and pain for 2 weeks. EXAM: BILATERAL LOWER EXTREMITY VENOUS DOPPLER ULTRASOUND TECHNIQUE: Gray-scale sonography with compression, as well as color and duplex ultrasound, were performed to evaluate the deep venous system(s) from the level of the common femoral vein through the popliteal and proximal calf veins. COMPARISON:  None. FINDINGS: VENOUS Normal compressibility of the bilateral common femoral, superficial femoral, and popliteal veins, as well as the visualized calf veins. Visualized  portions of bilateral profunda femoral vein and great saphenous vein unremarkable. No filling defects to suggest DVT on grayscale or color Doppler imaging. Doppler waveforms show normal direction of venous flow, normal respiratory plasticity and response to augmentation. Limited views of the contralateral common femoral vein are unremarkable. OTHER Right calf edema (image 31). Limitations: none IMPRESSION: No evidence of bilateral lower extremity deep venous thrombosis. Electronically Signed   By: Genevie Ann M.D.   On: 09/01/2021 10:45   ECHOCARDIOGRAM COMPLETE  Result Date: 09/01/2021    ECHOCARDIOGRAM REPORT   Patient Name:   Evan Warner Date of Exam: 09/01/2021 Medical Rec #:  [id number]         Height:       74.0 in Accession #:    [id number]        Weight:       374.0 lb Date of Birth:  Aug 04, 1951          BSA:          2.836 m Patient Age:    22 years          BP:           117/65 mmHg Patient Gender: M                 HR:           68 bpm. Exam Location:  Inpatient Procedure: 2D Echo, Cardiac Doppler, Color Doppler and Intracardiac            Opacification Agent Indications:    Dyspnea  History:  Patient has no prior history of Echocardiogram examinations.                 Arrythmias:Atrial Fibrillation; Risk Factors:Hypertension and                 Dyslipidemia.  Sonographer:    Clayton Lefort RDCS (AE) Referring Phys: [id number] Asaf Elmquist  Sonographer Comments: Technically difficult study due to poor echo windows and patient is morbidly obese. Image acquisition challenging due to patient body habitus. IMPRESSIONS  1. Left ventricular ejection fraction, by estimation, is 60 to 65%. The left ventricle has normal function. The left ventricle has no regional wall motion abnormalities. There is moderate concentric left ventricular hypertrophy. Left ventricular diastolic function could not be evaluated.  2. Right ventricular systolic function is normal. The right ventricular size is moderately enlarged.  Tricuspid regurgitation signal is inadequate for assessing PA pressure.  3. Left atrial size was severely dilated.  4. Right atrial size was severely dilated.  5. The mitral valve is grossly normal. Trivial mitral valve regurgitation. No evidence of mitral stenosis.  6. The aortic valve is grossly normal. Aortic valve regurgitation is not visualized. No aortic stenosis is present.  7. The inferior vena cava is dilated in size with >50% respiratory variability, suggesting right atrial pressure of 8 mmHg. Comparison(s): No prior Echocardiogram. Conclusion(s)/Recommendation(s): Technically challenging study, even with use of echo contrast. Normal LVEF without significant focal wall motion abnormalities. RV funtion appears normal, but RV size moderately dilated. Cannot estimate RVSP due to lack of TR jet. Severe biatrial enlargement. FINDINGS  Left Ventricle: Left ventricular ejection fraction, by estimation, is 60 to 65%. The left ventricle has normal function. The left ventricle has no regional wall motion abnormalities. Definity contrast agent was given IV to delineate the left ventricular  endocardial borders. The left ventricular internal cavity size was normal in size. There is moderate concentric left ventricular hypertrophy. Left ventricular diastolic function could not be evaluated due to atrial fibrillation. Left ventricular diastolic function could not be evaluated. Right Ventricle: The right ventricular size is moderately enlarged. Right vetricular wall thickness was not well visualized. Right ventricular systolic function is normal. Tricuspid regurgitation signal is inadequate for assessing PA pressure. Left Atrium: Left atrial size was severely dilated. Right Atrium: Right atrial size was severely dilated. Pericardium: Trivial pericardial effusion is present. Presence of pericardial fat pad. Mitral Valve: The mitral valve is grossly normal. Trivial mitral valve regurgitation. No evidence of mitral valve  stenosis. Tricuspid Valve: The tricuspid valve is grossly normal. Tricuspid valve regurgitation is trivial. No evidence of tricuspid stenosis. Aortic Valve: The aortic valve is grossly normal. Aortic valve regurgitation is not visualized. No aortic stenosis is present. Aortic valve mean gradient measures 3.0 mmHg. Aortic valve peak gradient measures 4.9 mmHg. Aortic valve area, by VTI measures 3.59 cm. Pulmonic Valve: The pulmonic valve was not well visualized. Pulmonic valve regurgitation is not visualized. Aorta: The ascending aorta was not well visualized, the aortic arch was not well visualized and the aortic root is normal in size and structure. Venous: The inferior vena cava is dilated in size with greater than 50% respiratory variability, suggesting right atrial pressure of 8 mmHg. IAS/Shunts: The interatrial septum was not well visualized.  LEFT VENTRICLE PLAX 2D LVIDd:         4.80 cm   Diastology LVIDs:         3.00 cm   LV e' medial:    10.60 cm/s LV PW:  1.80 cm   LV E/e' medial:  10.2 LV IVS:        1.60 cm   LV e' lateral:   15.90 cm/s LVOT diam:     2.30 cm   LV E/e' lateral: 6.8 LV SV:         79 LV SV Index:   28 LVOT Area:     4.15 cm  RIGHT VENTRICLE             IVC RV Basal diam:  5.20 cm     IVC diam: 2.70 cm RV Mid diam:    4.60 cm RV S prime:     10.80 cm/s TAPSE (M-mode): 1.9 cm LEFT ATRIUM              Index        RIGHT ATRIUM           Index LA diam:        4.90 cm  1.73 cm/m   RA Area:     43.90 cm LA Vol (A2C):   221.0 ml 77.93 ml/m  RA Volume:   190.00 ml 67.00 ml/m LA Vol (A4C):   144.0 ml 50.78 ml/m LA Biplane Vol: 193.0 ml 68.06 ml/m  AORTIC VALVE AV Area (Vmax):    3.52 cm AV Area (Vmean):   3.51 cm AV Area (VTI):     3.59 cm AV Vmax:           111.00 cm/s AV Vmean:          77.800 cm/s AV VTI:            0.221 m AV Peak Grad:      4.9 mmHg AV Mean Grad:      3.0 mmHg LVOT Vmax:         94.00 cm/s LVOT Vmean:        65.700 cm/s LVOT VTI:          0.191 m LVOT/AV  VTI ratio: 0.86  AORTA Ao Root diam: 4.10 cm Ao Asc diam:  3.90 cm MITRAL VALVE MV Area (PHT): 4.65 cm     SHUNTS MV Decel Time: 163 msec     Systemic VTI:  0.19 m MV E velocity: 108.00 cm/s  Systemic Diam: 2.30 cm MV A velocity: 43.70 cm/s MV E/A ratio:  2.47 Buford Dresser MD Electronically signed by Buford Dresser MD Signature Date/Time: 09/01/2021/11:55:06 AM    Final         Discharge Exam: Vitals:   09/02/21 2104 09/03/21 0428  BP: (!) 149/79 (!) 159/85  Pulse: 78 66  Resp: 20 20  Temp: 98.1 F (36.7 C) 98.4 F (36.9 C)  SpO2: 92% 93%   Vitals:   09/02/21 1220 09/02/21 1930 09/02/21 2104 09/03/21 0428  BP: (!) 144/77  (!) 149/79 (!) 159/85  Pulse: 61  78 66  Resp: _0 Temp: 97.8 F (36.6 C)  98.1 F (36.7 C) 98.4 F (36.9 C)  TempSrc:   Oral Oral  SpO2: 97% 97% 92% 93%  Weight:      Height:        General: Pt is alert, awake, not in acute distress Cardiovascular: RRR, S1/S2 +, no rubs, no gallops Respiratory: CTA bilaterally, no wheezing, no rhonchi Abdominal: Soft, NT, ND, bowel sounds + Extremities: right leg with mild erythema without crepitance, necrosis from midfoot to mid calf   The results of significant diagnostics from this hospitalization (including imaging, microbiology, ancillary and  laboratory) are listed below for reference.    Significant Diagnostic Studies: DG Tibia/Fibula Left  Result Date: 08/31/2021 CLINICAL DATA:  Anterior wound EXAM: LEFT TIBIA AND FIBULA - 2 VIEW COMPARISON:  None. FINDINGS: Left total knee prosthesis is well seated without periprosthetic fracture or lucency. Well corticated ossific fragment adjacent to the tip of the lateral malleolus likely sequelae of remote fracture. Diffuse soft tissue edema. No osseous erosions identified to indicate osteomyelitis. IMPRESSION: Diffuse soft tissue edema of the left lower leg without underlying acute osseous abnormality. Electronically Signed   By: Miachel Roux M.D.    On: 08/31/2021 12:57   DG Tibia/Fibula Right  Result Date: 08/31/2021 CLINICAL DATA:  Anterior leg wound. EXAM: RIGHT TIBIA AND FIBULA - 2 VIEW COMPARISON:  None. FINDINGS: Right knee replacement in satisfactory position alignment. No loosening or complication. Negative for fracture of the tibia or fibula. No bone lesion or osteomyelitis. Degenerative changes present in the ankle joint with mild joint space narrowing spurring. Diffuse soft tissue swelling around the ankle. IMPRESSION: Satisfactory right knee replacement Mild ankle degenerative change. Diffuse soft tissue swelling around the ankle No acute skeletal finding. Electronically Signed   By: Franchot Gallo M.D.   On: 08/31/2021 12:56   MR TIBIA FIBULA RIGHT WO CONTRAST  Result Date: 08/31/2021 CLINICAL DATA:  Redness and inflammation in the right lower leg. Soft tissue infection suspected. EXAM: MRI OF LOWER RIGHT EXTREMITY WITHOUT CONTRAST TECHNIQUE: Multiplanar, multisequence MR imaging of the right lower leg was performed. No intravenous contrast was administered. COMPARISON:  Radiographs 08/31/2021. FINDINGS: Bones/Joint/Cartilage Both lower legs are included on the coronal images. Patient is status post bilateral total knee arthroplasty. The right tibia and fibula appear normal, without evidence of acute fracture, dislocation or osteomyelitis. There is no large knee or ankle joint effusion. Ligaments Status post right total knee arthroplasty. Ankle findings dictated separately. Muscles and Tendons The right lower leg muscles appear unremarkable. The visualized extensor mechanism at the right knee appears intact. The visualized ankle tendons appear intact. Soft tissues There is generalized subcutaneous edema within both lower legs, worse on the right. No focal fluid collections are identified on noncontrast imaging. There are asymmetric varicosities posteriorly at the right knee. IMPRESSION: 1. Nonspecific generalized subcutaneous edema within  both lower legs, right greater than left. This could represent cellulitis, venous insufficiency, lymphedema or heart failure. No focal fluid collection identified. 2. No evidence of deep soft tissue infection, osteomyelitis or significant joint effusion. 3. Ankle and foot findings dictated separately. Electronically Signed   By: Richardean Sale M.D.   On: 08/31/2021 17:36   MR FOOT RIGHT WO CONTRAST  Result Date: 08/31/2021 CLINICAL DATA:  Redness and inflammation in the right lower leg. Soft tissue infection suspected. History of diabetes. EXAM: MRI OF THE RIGHT FOREFOOT WITHOUT CONTRAST TECHNIQUE: Multiplanar, multisequence MR imaging of the right forefoot was performed. No intravenous contrast was administered. COMPARISON:  None. FINDINGS: Despite efforts by the technologist and patient, mild motion artifact is present on today's exam and could not be eliminated. This reduces exam sensitivity and specificity. Bones/Joint/Cartilage No evidence of acute fracture, dislocation or bone destruction. There are midfoot degenerative changes with scattered subchondral cysts, largest in the middle cuneiform bone. The alignment is normal at the Lisfranc joint. There are moderate degenerative changes at the 1st metatarsophalangeal joint, associated with a hallux valgus deformity. No significant joint effusions. Ligaments The Lisfranc ligament is intact. The collateral ligaments of the metatarsophalangeal joints appear intact. Muscles and Tendons The forefoot  muscles and tendons appear unremarkable. No significant tenosynovitis. Soft tissues Generalized subcutaneous edema, greatest in the dorsal aspect of the foot. No focal fluid collection or foreign body identified. IMPRESSION: 1. Nonspecific dorsal subcutaneous edema as can be seen with cellulitis, venous insufficiency, heart failure or lymphedema. No focal fluid collection identified. 2. No evidence of focal fluid collection, deep soft tissue infection or  osteomyelitis. 3. Midfoot and 1st MTP degenerative changes. Electronically Signed   By: Richardean Sale M.D.   On: 08/31/2021 17:40   MR ANKLE RIGHT WO CONTRAST  Result Date: 08/31/2021 CLINICAL DATA:  Redness and inflammation in the right lower leg. Soft tissue infection suspected. History of diabetes. EXAM: MRI OF THE RIGHT ANKLE WITHOUT CONTRAST TECHNIQUE: Multiplanar, multisequence MR imaging of the ankle was performed. No intravenous contrast was administered. COMPARISON:  Radiographs 08/31/2021 FINDINGS: TENDONS Peroneal: Intact and normally positioned. Posteromedial: Mild posterior tibialis tendinosis with probable mild longitudinal split tearing. The additional medial flexor tendons appear intact. No significant tendon sheath fluid. Anterior: Intact and normally positioned. Achilles: Intact. Plantar Fascia: Mild thickening of the central cord of the plantar fascia without tear. Small adjacent plantar calcaneal spur. LIGAMENTS Lateral: The anterior and posterior talofibular and calcaneofibular ligaments are intact.The inferior tibiofibular ligaments appear intact. Medial: The deltoid and visualized portions of the spring ligament appear intact. CARTILAGE AND BONES Ankle Joint: No significant ankle joint effusion. There are mild tibiotalar degenerative changes with prominent subchondral cysts laterally in the tibial plafond. Subtalar Joints/Sinus Tarsi: Mild subtalar degenerative changes. There is mild edema in the tarsal sinus. No focal fluid collection. Bones: No evidence of acute fracture, dislocation or bone destruction. Other: Generalized subcutaneous edema surrounding the ankle without focal fluid collection. IMPRESSION: 1. Generalized subcutaneous edema surrounding the ankle, as can be seen with cellulitis, venous insufficiency, heart failure or lymphedema. 2. No evidence of osteomyelitis, septic joint or abscess. 3. Mild tibiotalar and hindfoot degenerative changes. 4. Foot and lower leg findings  dictated separately. Electronically Signed   By: Richardean Sale M.D.   On: 08/31/2021 17:44   US Venous Img Lower Bilateral (DVT)  Result Date: 09/01/2021 CLINICAL DATA:  70 year old male with right greater than left lower extremity edema and pain for 2 weeks. EXAM: BILATERAL LOWER EXTREMITY VENOUS DOPPLER ULTRASOUND TECHNIQUE: Gray-scale sonography with compression, as well as color and duplex ultrasound, were performed to evaluate the deep venous system(s) from the level of the common femoral vein through the popliteal and proximal calf veins. COMPARISON:  None. FINDINGS: VENOUS Normal compressibility of the bilateral common femoral, superficial femoral, and popliteal veins, as well as the visualized calf veins. Visualized portions of bilateral profunda femoral vein and great saphenous vein unremarkable. No filling defects to suggest DVT on grayscale or color Doppler imaging. Doppler waveforms show normal direction of venous flow, normal respiratory plasticity and response to augmentation. Limited views of the contralateral common femoral vein are unremarkable. OTHER Right calf edema (image 31). Limitations: none IMPRESSION: No evidence of bilateral lower extremity deep venous thrombosis. Electronically Signed   By: Genevie Ann M.D.   On: 09/01/2021 10:45   ECHOCARDIOGRAM COMPLETE  Result Date: 09/01/2021    ECHOCARDIOGRAM REPORT   Patient Name:   Evan Warner Date of Exam: 09/01/2021 Medical Rec #:  [id number]         Height:       74.0 in Accession #:    [id number]        Weight:       374.0 lb Date  of Birth:  August 28, 1951          BSA:          2.836 m Patient Age:    59 years          BP:           117/65 mmHg Patient Gender: M                 HR:           68 bpm. Exam Location:  Inpatient Procedure: 2D Echo, Cardiac Doppler, Color Doppler and Intracardiac            Opacification Agent Indications:    Dyspnea  History:        Patient has no prior history of Echocardiogram examinations.                  Arrythmias:Atrial Fibrillation; Risk Factors:Hypertension and                 Dyslipidemia.  Sonographer:    Clayton Lefort RDCS (AE) Referring Phys: [phone] Johnluke Haugen  Sonographer Comments: Technically difficult study due to poor echo windows and patient is morbidly obese. Image acquisition challenging due to patient body habitus. IMPRESSIONS  1. Left ventricular ejection fraction, by estimation, is 60 to 65%. The left ventricle has normal function. The left ventricle has no regional wall motion abnormalities. There is moderate concentric left ventricular hypertrophy. Left ventricular diastolic function could not be evaluated.  2. Right ventricular systolic function is normal. The right ventricular size is moderately enlarged. Tricuspid regurgitation signal is inadequate for assessing PA pressure.  3. Left atrial size was severely dilated.  4. Right atrial size was severely dilated.  5. The mitral valve is grossly normal. Trivial mitral valve regurgitation. No evidence of mitral stenosis.  6. The aortic valve is grossly normal. Aortic valve regurgitation is not visualized. No aortic stenosis is present.  7. The inferior vena cava is dilated in size with >50% respiratory variability, suggesting right atrial pressure of 8 mmHg. Comparison(s): No prior Echocardiogram. Conclusion(s)/Recommendation(s): Technically challenging study, even with use of echo contrast. Normal LVEF without significant focal wall motion abnormalities. RV funtion appears normal, but RV size moderately dilated. Cannot estimate RVSP due to lack of TR jet. Severe biatrial enlargement. FINDINGS  Left Ventricle: Left ventricular ejection fraction, by estimation, is 60 to 65%. The left ventricle has normal function. The left ventricle has no regional wall motion abnormalities. Definity contrast agent was given IV to delineate the left ventricular  endocardial borders. The left ventricular internal cavity size was normal in size. There is moderate  concentric left ventricular hypertrophy. Left ventricular diastolic function could not be evaluated due to atrial fibrillation. Left ventricular diastolic function could not be evaluated. Right Ventricle: The right ventricular size is moderately enlarged. Right vetricular wall thickness was not well visualized. Right ventricular systolic function is normal. Tricuspid regurgitation signal is inadequate for assessing PA pressure. Left Atrium: Left atrial size was severely dilated. Right Atrium: Right atrial size was severely dilated. Pericardium: Trivial pericardial effusion is present. Presence of pericardial fat pad. Mitral Valve: The mitral valve is grossly normal. Trivial mitral valve regurgitation. No evidence of mitral valve stenosis. Tricuspid Valve: The tricuspid valve is grossly normal. Tricuspid valve regurgitation is trivial. No evidence of tricuspid stenosis. Aortic Valve: The aortic valve is grossly normal. Aortic valve regurgitation is not visualized. No aortic stenosis is present. Aortic valve mean gradient measures 3.0 mmHg. Aortic valve peak gradient  measures 4.9 mmHg. Aortic valve area, by VTI measures 3.59 cm. Pulmonic Valve: The pulmonic valve was not well visualized. Pulmonic valve regurgitation is not visualized. Aorta: The ascending aorta was not well visualized, the aortic arch was not well visualized and the aortic root is normal in size and structure. Venous: The inferior vena cava is dilated in size with greater than 50% respiratory variability, suggesting right atrial pressure of 8 mmHg. IAS/Shunts: The interatrial septum was not well visualized.  LEFT VENTRICLE PLAX 2D LVIDd:         4.80 cm   Diastology LVIDs:         3.00 cm   LV e' medial:    10.60 cm/s LV PW:         1.80 cm   LV E/e' medial:  10.2 LV IVS:        1.60 cm   LV e' lateral:   15.90 cm/s LVOT diam:     2.30 cm   LV E/e' lateral: 6.8 LV SV:         79 LV SV Index:   28 LVOT Area:     4.15 cm  RIGHT VENTRICLE              IVC RV Basal diam:  5.20 cm     IVC diam: 2.70 cm RV Mid diam:    4.60 cm RV S prime:     10.80 cm/s TAPSE (M-mode): 1.9 cm LEFT ATRIUM              Index        RIGHT ATRIUM           Index LA diam:        4.90 cm  1.73 cm/m   RA Area:     43.90 cm LA Vol (A2C):   221.0 ml 77.93 ml/m  RA Volume:   190.00 ml 67.00 ml/m LA Vol (A4C):   144.0 ml 50.78 ml/m LA Biplane Vol: 193.0 ml 68.06 ml/m  AORTIC VALVE AV Area (Vmax):    3.52 cm AV Area (Vmean):   3.51 cm AV Area (VTI):     3.59 cm AV Vmax:           111.00 cm/s AV Vmean:          77.800 cm/s AV VTI:            0.221 m AV Peak Grad:      4.9 mmHg AV Mean Grad:      3.0 mmHg LVOT Vmax:         94.00 cm/s LVOT Vmean:        65.700 cm/s LVOT VTI:          0.191 m LVOT/AV VTI ratio: 0.86  AORTA Ao Root diam: 4.10 cm Ao Asc diam:  3.90 cm MITRAL VALVE MV Area (PHT): 4.65 cm     SHUNTS MV Decel Time: 163 msec     Systemic VTI:  0.19 m MV E velocity: 108.00 cm/s  Systemic Diam: 2.30 cm MV A velocity: 43.70 cm/s MV E/A ratio:  2.47 Buford Dresser MD Electronically signed by Buford Dresser MD Signature Date/Time: 09/01/2021/11:55:06 AM    Final     Microbiology: Recent Results (from the past 240 hour(s))  Blood Culture (routine x 2)     Status: None (Preliminary result)   Collection Time: 08/31/21 11:30 AM   Specimen: Right Antecubital; Blood  Result Value Ref Range Status   Specimen Description  Final    RIGHT ANTECUBITAL BOTTLES DRAWN AEROBIC AND ANAEROBIC   Special Requests   Final    Blood Culture results may not be optimal due to an excessive volume of blood received in culture bottles   Culture   Final    NO GROWTH 2 DAYS Performed at [facility], [address] [place], [place] [address]    Report Status PENDING  Incomplete  Resp Panel by RT-PCR (Flu A&B, Covid) Nasopharyngeal Swab     Status: None   Collection Time: 08/31/21 11:54 AM   Specimen: Nasopharyngeal Swab; Nasopharyngeal(NP) swabs in vial transport medium   Result Value Ref Range Status   SARS Coronavirus 2 by RT PCR NEGATIVE NEGATIVE Final    Comment: (NOTE) SARS-CoV-2 target nucleic acids are NOT DETECTED.  The SARS-CoV-2 RNA is generally detectable in upper respiratory specimens during the acute phase of infection. The lowest concentration of SARS-CoV-2 viral copies this assay can detect is 138 copies/mL. A negative result does not preclude SARS-Cov-2 infection and should not be used as the sole basis for treatment or other patient management decisions. A negative result may occur with  improper specimen collection/handling, submission of specimen other than nasopharyngeal swab, presence of viral mutation(s) within the areas targeted by this assay, and inadequate number of viral copies(<138 copies/mL). A negative result must be combined with clinical observations, patient history, and epidemiological information. The expected result is Negative.  Fact Sheet for Patients:  EntrepreneurPulse.com.au  Fact Sheet for Healthcare Providers:  IncredibleEmployment.be  This test is no t yet approved or cleared by the [place] FDA and  has been authorized for detection and/or diagnosis of SARS-CoV-2 by FDA under an Emergency Use Authorization (EUA). This EUA will remain  in effect (meaning this test can be used) for the duration of the COVID-19 declaration under Section 564(b)(1) of the Act, 21 U.S.C.section 360bbb-3(b)(1), unless the authorization is terminated  or revoked sooner.       Influenza A by PCR NEGATIVE NEGATIVE Final   Influenza B by PCR NEGATIVE NEGATIVE Final    Comment: (NOTE) The Xpert Xpress SARS-CoV-2/FLU/RSV plus assay is intended as an aid in the diagnosis of influenza from Nasopharyngeal swab specimens and should not be used as a sole basis for treatment. Nasal washings and aspirates are unacceptable for Xpert Xpress SARS-CoV-2/FLU/RSV testing.  Fact Sheet for  Patients: EntrepreneurPulse.com.au  Fact Sheet for Healthcare Providers: IncredibleEmployment.be  This test is not yet approved or cleared by the [place] FDA and has been authorized for detection and/or diagnosis of SARS-CoV-2 by FDA under an Emergency Use Authorization (EUA). This EUA will remain in effect (meaning this test can be used) for the duration of the COVID-19 declaration under Section 564(b)(1) of the Act, 21 U.S.C. section 360bbb-3(b)(1), unless the authorization is terminated or revoked.  Performed at [facility], [address] [place], [place] [address]   Blood Culture (routine x 2)     Status: None (Preliminary result)   Collection Time: 08/31/21 12:36 PM   Specimen: BLOOD LEFT ARM  Result Value Ref Range Status   Specimen Description BLOOD LEFT ARM  Final   Special Requests   Final    Blood Culture adequate volume BOTTLES DRAWN AEROBIC AND ANAEROBIC   Culture   Final    NO GROWTH 2 DAYS Performed at [facility], [address] [place], [place] [address]    Report Status PENDING  Incomplete     Labs: Basic Metabolic Panel: Recent Labs  Lab  08/31/21 1130 09/01/21 0553 09/02/21 0402 09/03/21 0408  NA 140 138 140 139  K 2.9* 3.9 4.4 3.9  CL 102 103 106 107  CO2 _0 GLUCOSE 114* 177* 127* 131*  BUN _1 24*  CREATININE 1.13 1.00 1.11 0.96  CALCIUM 8.8* 8.9 8.5* 8.7*  MG  --   --  2.3 2.2   Liver Function Tests: Recent Labs  Lab 08/31/21 1130  AST 17  ALT 21  ALKPHOS 57  BILITOT 1.6*  PROT 6.7  ALBUMIN 3.8   No results for input(s): LIPASE, AMYLASE in the last 168 hours. No results for input(s): AMMONIA in the last 168 hours. CBC: Recent Labs  Lab 08/31/21 1130 09/01/21 0553 09/02/21 0402 09/03/21 0408  WBC 15.7* 17.7* 14.7* 10.9*  NEUTROABS 13.1*  --   --   --   HGB 14.2 13.8 12.3* 12.1*  HCT 43.2 43.2 37.8* 37.2*  MCV 97.5 97.1 98.4 97.4  PLT 175 163 156 178   Cardiac  Enzymes: No results for input(s): CKTOTAL, CKMB, CKMBINDEX, TROPONINI in the last 168 hours. BNP: Invalid input(s): POCBNP CBG: No results for input(s): GLUCAP in the last 168 hours.  Time coordinating discharge:  36 minutes  Signed:  Orson Eva, DO [facility] Pager: [phone] 09/03/2021, 7:14 AM

## 2021-09-05 LAB — CULTURE, BLOOD (ROUTINE X 2)
Culture: NO GROWTH
Culture: NO GROWTH
Special Requests: ADEQUATE

## 2023-05-12 ENCOUNTER — Emergency Department (HOSPITAL_COMMUNITY)
Admission: EM | Admit: 2023-05-12 | Discharge: 2023-05-13 | Disposition: A | Discharge status: Home / Self Care | Payer: No Typology Code available for payment source | Attending: Emergency Medicine | Admitting: Emergency Medicine

## 2023-05-12 ENCOUNTER — Other Ambulatory Visit: Payer: Self-pay

## 2023-05-12 ENCOUNTER — Emergency Department (HOSPITAL_COMMUNITY): Payer: No Typology Code available for payment source

## 2023-05-12 ENCOUNTER — Encounter (HOSPITAL_COMMUNITY): Payer: Self-pay | Admitting: Emergency Medicine

## 2023-05-12 DIAGNOSIS — Z79899 Other long term (current) drug therapy: Secondary | ICD-10-CM | POA: Diagnosis not present

## 2023-05-12 DIAGNOSIS — E876 Hypokalemia: Secondary | ICD-10-CM | POA: Diagnosis not present

## 2023-05-12 DIAGNOSIS — Z7982 Long term (current) use of aspirin: Secondary | ICD-10-CM | POA: Diagnosis not present

## 2023-05-12 DIAGNOSIS — R799 Abnormal finding of blood chemistry, unspecified: Secondary | ICD-10-CM | POA: Diagnosis present

## 2023-05-12 DIAGNOSIS — I10 Essential (primary) hypertension: Secondary | ICD-10-CM | POA: Diagnosis not present

## 2023-05-12 DIAGNOSIS — R197 Diarrhea, unspecified: Secondary | ICD-10-CM | POA: Insufficient documentation

## 2023-05-12 LAB — CBC
HCT: 39.8 % (ref 39.0–52.0)
Hemoglobin: 13.3 g/dL (ref 13.0–17.0)
MCH: 29.8 pg (ref 26.0–34.0)
MCHC: 33.4 g/dL (ref 30.0–36.0)
MCV: 89 fL (ref 80.0–100.0)
Platelets: 254 10*3/uL (ref 150–400)
RBC: 4.47 MIL/uL (ref 4.22–5.81)
RDW: 13.8 % (ref 11.5–15.5)
WBC: 6.9 10*3/uL (ref 4.0–10.5)
nRBC: 0 % (ref 0.0–0.2)

## 2023-05-12 LAB — COMPREHENSIVE METABOLIC PANEL
ALT: 11 U/L (ref 0–44)
AST: 14 U/L — ABNORMAL LOW (ref 15–41)
Albumin: 3.6 g/dL (ref 3.5–5.0)
Alkaline Phosphatase: 81 U/L (ref 38–126)
Anion gap: 7 (ref 5–15)
BUN: 29 mg/dL — ABNORMAL HIGH (ref 8–23)
CO2: 22 mmol/L (ref 22–32)
Calcium: 8.8 mg/dL — ABNORMAL LOW (ref 8.9–10.3)
Chloride: 108 mmol/L (ref 98–111)
Creatinine, Ser: 1.37 mg/dL — ABNORMAL HIGH (ref 0.61–1.24)
GFR, Estimated: 55 mL/min — ABNORMAL LOW (ref >60)
Glucose, Bld: 120 mg/dL — ABNORMAL HIGH (ref 70–99)
Potassium: 2.8 mmol/L — ABNORMAL LOW (ref 3.5–5.1)
Sodium: 137 mmol/L (ref 135–145)
Total Bilirubin: 0.7 mg/dL (ref 0.3–1.2)
Total Protein: 6.9 g/dL (ref 6.5–8.1)

## 2023-05-12 LAB — LIPASE, BLOOD: Lipase: 37 U/L (ref 11–51)

## 2023-05-12 MED ORDER — POTASSIUM CHLORIDE 10 MEQ/100ML IV SOLN
10.0000 meq | INTRAVENOUS | Status: AC
  Administered 2023-05-12 – 2023-05-13 (×2): 10 meq via INTRAVENOUS
  Filled 2023-05-12 (×2): qty 100

## 2023-05-12 MED ORDER — POTASSIUM CHLORIDE CRYS ER 20 MEQ PO TBCR
40.0000 meq | EXTENDED_RELEASE_TABLET | Freq: Once | ORAL | Status: AC
  Administered 2023-05-12: 40 meq via ORAL
  Filled 2023-05-12: qty 2

## 2023-05-12 MED ORDER — ONDANSETRON HCL 4 MG/2ML IJ SOLN: 4.0000 mg | Freq: Once | INTRAMUSCULAR | Status: DC | PRN

## 2023-05-12 MED ORDER — IOHEXOL 300 MG/ML  SOLN
100.0000 mL | Freq: Once | INTRAMUSCULAR | Status: AC | PRN
  Administered 2023-05-12: 100 mL via INTRAVENOUS

## 2023-05-12 NOTE — ED Triage Notes (Signed)
Pt via POV per UC recommendation due to critically low potassium per lab work done earlier today. Pt notes they also mentioned issues with renal function but he cannot remember exactly what was abnormal. Ambulatory and a/o x 4 on arrival. Pt has had ongoing diarrhea x 6-7 days with numerous episodes daily. Pt denies pain at time of triage.

## 2023-05-13 LAB — URINALYSIS, ROUTINE W REFLEX MICROSCOPIC
Bilirubin Urine: NEGATIVE
Glucose, UA: NEGATIVE mg/dL
Hgb urine dipstick: NEGATIVE
Ketones, ur: NEGATIVE mg/dL
Leukocytes,Ua: NEGATIVE
Nitrite: NEGATIVE
Protein, ur: NEGATIVE mg/dL
Specific Gravity, Urine: 1.024 (ref 1.005–1.030)
pH: 5 (ref 5.0–8.0)

## 2023-05-13 MED ORDER — POTASSIUM CHLORIDE CRYS ER 20 MEQ PO TBCR: 20.0000 meq | EXTENDED_RELEASE_TABLET | Freq: Two times a day (BID) | ORAL | 0 refills | Status: AC

## 2023-05-13 NOTE — ED Provider Notes (Signed)
[place] EMERGENCY DEPARTMENT AT [facility] Provider Note   CSN: [id number] Arrival date & time: 05/12/23  1927     History  Chief Complaint  Patient presents with   Abnormal Lab    Evan Warner is a 72 y.o. male.  HPI     This is a 72 year old male who presents with concern for low potassium.  Patient states he was seen and evaluated at urgent care.  He states he has had diarrhea for the last 6 to 7 days.  No fevers.  He did have some intermittent left lower quadrant abdominal pain which seems to have improved.  He has not noted any blood in the stools.  He had lab work done at urgent care and got a phone call that his potassium was significantly low.  He is having ongoing diarrhea but no ongoing abdominal pain.  Denies nausea or vomiting.  No recent antibiotic use.  No recent travel.  Home Medications Prior to Admission medications   Medication Sig Start Date End Date Taking? Authorizing Provider  potassium chloride SA (KLOR-CON M) 20 MEQ tablet Take 1 tablet (20 mEq total) by mouth 2 (two) times daily. 05/13/23  Yes Zylpha Poynor, Mayer Masker, MD  allopurinol (ZYLOPRIM) 300 MG tablet Take 150 mg by mouth daily.    [provider]  amoxicillin-clavulanate (AUGMENTIN) 875-125 MG tablet Take 1 tablet by mouth every 12 (twelve) hours. 09/03/21   Catarina Hartshorn, MD  aspirin 81 MG chewable tablet Chew 81 mg by mouth daily. 10/31/15   [provider]  calcium carbonate (TUMS - DOSED IN MG ELEMENTAL CALCIUM) 500 MG chewable tablet Chew 1 tablet by mouth daily.    [provider]  Coenzyme Q-10 100 MG capsule Take 100 mg by mouth daily.      [provider]  diclofenac Sodium (VOLTAREN) 1 % GEL Apply 2 g topically daily as needed (pain). 04/19/21   [provider]  doxycycline (VIBRA-TABS) 100 MG tablet Take 1 tablet (100 mg total) by mouth every 12 (twelve) hours. 09/03/21   Catarina Hartshorn, MD  FLUoxetine (PROZAC) 20 MG capsule Take 20 mg by  mouth daily.    [provider]  lisinopril-hydrochlorothiazide (PRINZIDE,ZESTORETIC) 20-12.5 MG tablet Take 2 tablets by mouth daily.    [provider]  Menthol-Methyl Salicylate (THERA-GESIC) 0.5-15 % CREA APPLY SMALL AMOUNT TO AFFECTED AREA TWICE A DAY AS NEEDED TO LEFT SHOULDER FOR PAIN 04/19/21   [provider]  NIFEdipine (PROCARDIA-XL/ADALAT CC) 30 MG 24 hr tablet Take 30 mg by mouth daily.    [provider]  nitroGLYCERIN (NITROSTAT) 0.4 MG SL tablet Place 0.4 mg under the tongue as needed.      [provider]  NON FORMULARY c-papa  And O2    [provider]  predniSONE (DELTASONE) 20 MG tablet Take 30 mg by mouth daily. 05/29/21   [provider]  pyridostigmine (MESTINON) 60 MG tablet Take 30 mg by mouth 3 (three) times daily. 08/07/21   [provider]  rivaroxaban (XARELTO) 20 MG TABS tablet Take 20 mg by mouth daily. 04/13/20   [provider]  simvastatin (ZOCOR) 80 MG tablet Take 80 mg by mouth daily.    [provider]  Tamsulosin HCl (FLOMAX) 0.4 MG CAPS Take 0.8 mg by mouth at bedtime.    [provider]      Allergies    No known allergies    Review of Systems   Review of  Systems  Constitutional:  Negative for fever.  Gastrointestinal:  Positive for abdominal pain and diarrhea. Negative for nausea and vomiting.  All other systems reviewed and are negative.   Physical Exam Updated Vital Signs BP 104/73   Pulse 75   Temp 98 F (36.7 C)   Resp 16   Ht 1.88 m (6\' 2" )   Wt (!) 165.6 kg   SpO2 93%   BMI 46.86 kg/m  Physical Exam Vitals and nursing note reviewed.  Constitutional:      Appearance: He is well-developed. He is obese. He is not ill-appearing.  HENT:     Head: Normocephalic and atraumatic.  Eyes:     Pupils: Pupils are equal, round, and reactive to light.  Cardiovascular:     Rate and Rhythm: Normal rate and regular rhythm.     Heart sounds: Normal  heart sounds. No murmur heard. Pulmonary:     Effort: Pulmonary effort is normal. No respiratory distress.     Breath sounds: Normal breath sounds. No wheezing.  Abdominal:     General: Bowel sounds are normal.     Palpations: Abdomen is soft.     Tenderness: There is no abdominal tenderness. There is no rebound.  Musculoskeletal:     Cervical back: Neck supple.  Lymphadenopathy:     Cervical: No cervical adenopathy.  Skin:    General: Skin is warm and dry.  Neurological:     Mental Status: He is alert and oriented to person, place, and time.  Psychiatric:        Mood and Affect: Mood normal.     ED Results / Procedures / Treatments   Labs (all labs ordered are listed, but only abnormal results are displayed) Labs Reviewed  COMPREHENSIVE METABOLIC PANEL - Abnormal; Notable for the following components:      Result Value   Potassium 2.8 (*)    Glucose, Bld 120 (*)    BUN 29 (*)    Creatinine, Ser 1.37 (*)    Calcium 8.8 (*)    AST 14 (*)    GFR, Estimated 55 (*)    All other components within normal limits  LIPASE, BLOOD  CBC  URINALYSIS, ROUTINE W REFLEX MICROSCOPIC    EKG EKG Interpretation Date/Time:  Monday May 12 2023 23:16:15 EDT Ventricular Rate:  55 PR Interval:    QRS Duration:  116 QT Interval:  554 QTC Calculation: 530 R Axis:   19  Text Interpretation: Atrial fibrillation Nonspecific intraventricular conduction delay Low voltage, extremity and precordial leads Consider anterior infarct Confirmed by Ross Marcus ([id number]) on 05/13/2023 2:32:55 AM  Radiology CT ABDOMEN PELVIS W CONTRAST  Result Date: 05/12/2023 CLINICAL DATA:  Left upper quadrant abdominal pain, diarrhea EXAM: CT ABDOMEN AND PELVIS WITH CONTRAST TECHNIQUE: Multidetector CT imaging of the abdomen and pelvis was performed using the standard protocol following bolus administration of intravenous contrast. RADIATION DOSE REDUCTION: This exam was performed according to the departmental  dose-optimization program which includes automated exposure control, adjustment of the mA and/or kV according to patient size and/or use of iterative reconstruction technique. CONTRAST:  OMNIPAQUE IOHEXOL 300 MG/ML  SOLN COMPARISON:  02/15/2010 FINDINGS: Lower chest: Lung bases are clear. Hepatobiliary: Liver is within normal limits. Gallbladder is unremarkable. No intrahepatic or extrahepatic duct dilatation. Pancreas: Scattered parenchymal calcifications, likely reflecting sequela of prior/chronic pancreatitis. Spleen: Within normal limits. Adrenals/Urinary Tract: Adrenal glands are within normal limits. 5 mm nonobstructing left upper pole renal calculus (series 2/image 39). 10 mm nonobstructing  right lower pole renal calculus. Two right lower pole renal cysts measuring up to 17 mm (series 2/image 41), measuring simple fluid density, benign (Bosniak I). No follow-up is recommended. No ureteral or bladder calculi.  No hydronephrosis. Bladder is notable for a peripheral wall calcification (series 2/images [phone]), but is otherwise within normal limits. Stomach/Bowel: Stomach is within normal limits. No evidence of bowel obstruction. Normal appendix (series 2/image 61). Left colonic diverticulosis, without evidence of diverticulitis. Vascular/Lymphatic: No evidence of abdominal aortic aneurysm. Atherosclerotic calcifications of the abdominal aorta and branch vessels. No suspicious abdominopelvic lymphadenopathy. Reproductive: Prostate is notable for dystrophic calcifications. Other: No abdominopelvic ascites. Musculoskeletal: Degenerative changes of the visualized thoracolumbar spine. IMPRESSION: Left colonic diverticulosis, without evidence of diverticulitis. Bilateral nonobstructing renal calculi, measuring up to 10 mm on the right. No ureteral or bladder calculi. No hydronephrosis. Additional ancillary findings as above. Electronically Signed   By: Charline Bills M.D.   On: 05/12/2023 23:55     Procedures .Critical Care  Performed by: Shon Baton, MD Authorized by: Shon Baton, MD   Critical care provider statement:    Critical care time (minutes):  31   Critical care was necessary to treat or prevent imminent or life-threatening deterioration of the following conditions:  Metabolic crisis (Hypokalemia)   Critical care was time spent personally by me on the following activities:  Development of treatment plan with patient or surrogate, discussions with consultants, evaluation of patient's response to treatment, examination of patient, ordering and review of laboratory studies, ordering and review of radiographic studies, ordering and performing treatments and interventions, pulse oximetry, re-evaluation of patient's condition and review of old charts     Medications Ordered in ED Medications  ondansetron (ZOFRAN) injection 4 mg (has no administration in time range)  potassium chloride 10 mEq in 100 mL IVPB (0 mEq Intravenous Stopped 05/13/23 0140)  potassium chloride SA (KLOR-CON M) CR tablet 40 mEq (40 mEq Oral Given 05/12/23 2316)  iohexol (OMNIPAQUE) 300 MG/ML solution 100 mL (100 mLs Intravenous Contrast Given 05/12/23 2334)    ED Course/ Medical Decision Making/ A&P                             Medical Decision Making Amount and/or Complexity of Data Reviewed Labs: ordered. Radiology: ordered.  Risk Prescription drug management.   This patient presents to the ED for concern of diarrhea, low potassium, this involves an extensive number of treatment options, and is a complaint that carries with it a high risk of complications and morbidity.  I considered the following differential and admission for this acute, potentially life threatening condition.  The differential diagnosis includes metabolic derangement, diarrhea secondary to infectious source, colitis, diverticulitis  MDM:    This is a 72 year old male who presents with concerns for low potassium.  He  had some recent diarrhea.  He is nontoxic and vital signs are reassuring.  Did have some abdominal pain but he states that that has essentially improved.  His abdominal exam is benign.  Potassium here is 2.8.  Patient was given IV and oral potassium.  Given his age and abdominal pain with ongoing diarrhea, I did offer imaging to evaluate for things such as colitis given his history of A-fib he would have an increased risk potentially for ischemic colitis.  Patient wishes to proceed with CT.  CT does not show any evidence of diverticulitis or colitis.  He does have evidence of diverticulosis.  No bleeding.  Patient finished 2 rounds of potassium.  Will have him follow-up for recheck.  He was given 1 week supply of potassium supplementation for home.  (Labs, imaging, consults)  Labs: I Ordered, and personally interpreted labs.  The pertinent results include: CBC, CMP, lipase  Imaging Studies ordered: I ordered imaging studies including CT abdomen pelvis I independently visualized and interpreted imaging. I agree with the radiologist interpretation  Additional history obtained from wife at bedside.  External records from outside source obtained and reviewed including prior evaluations  Cardiac Monitoring: The patient was maintained on a cardiac monitor.  If on the cardiac monitor, I personally viewed and interpreted the cardiac monitored which showed an underlying rhythm of: Atrial fibrillation  Reevaluation: After the interventions noted above, I reevaluated the patient and found that they have :improved  Social Determinants of Health:  lives independently  Disposition: Discharge  Co morbidities that complicate the patient evaluation  Past Medical History:  Diagnosis Date   A-fib [facility])    chronic afib history per 02/20/16 [facility] cardioloy note   Arthritis    Coronary atherosclerosis of native coronary artery    DES ramus 2005, LVEF 55%.Takes Pradaxa daily   Enlarged prostate     takes Flomax nightly   Essential hypertension, benign    takes Atenolol,Lisinipril-HCTZ,and Procardia daily   GERD (gastroesophageal reflux disease)    takes Pantoprazole daily   Gout    takes Allopurinol daily   History of kidney stones    Hyperlipidemia    takes Simvastatin daily   Joint pain    Joint swelling    MI (myocardial infarction) (HCC)    [place] 2005   Mood disorder [facility])    takes Fluoxetine daily   Myasthenia gravis (HCC)    Nocturia    OSA (obstructive sleep apnea)    On CPAP   Peripheral edema    occasionally   Pneumonia    history of    Urinary frequency      Medicines Meds ordered this encounter  Medications   ondansetron (ZOFRAN) injection 4 mg   potassium chloride 10 mEq in 100 mL IVPB   potassium chloride SA (KLOR-CON M) CR tablet 40 mEq   iohexol (OMNIPAQUE) 300 MG/ML solution 100 mL   potassium chloride SA (KLOR-CON M) 20 MEQ tablet    Sig: Take 1 tablet (20 mEq total) by mouth 2 (two) times daily.    Dispense:  10 tablet    Refill:  0    I have reviewed the patients home medicines and have made adjustments as needed  Problem List / ED Course: Problem List Items Addressed This Visit   None Visit Diagnoses     Diarrhea, unspecified type    -  Primary   Hypokalemia                       Final Clinical Impression(s) / ED Diagnoses Final diagnoses:  Diarrhea, unspecified type  Hypokalemia    Rx / DC Orders ED Discharge Orders          Ordered    potassium chloride SA (KLOR-CON M) 20 MEQ tablet  2 times daily        05/13/23 0227              Shon Baton, MD 05/13/23 [phone]

## 2023-05-13 NOTE — Discharge Instructions (Signed)
You were seen today for low potassium and diarrhea.  Your workup was otherwise reassuring.  Take potassium over the next 5 days and have your potassium rechecked by your primary physician.  You may need ongoing potassium supplementation.  In the meantime, increase potassium rich foods in your diet.
